# Patient Record
Sex: Male | Born: 1990
Health system: Southern US, Community
[De-identification: ages and names within clinical notes are randomized; demographics above are authoritative.]

## PROBLEM LIST (undated history)

## (undated) DIAGNOSIS — F10230 Alcohol dependence with withdrawal, uncomplicated: Secondary | ICD-10-CM

## (undated) DIAGNOSIS — J45909 Unspecified asthma, uncomplicated: Secondary | ICD-10-CM

## (undated) DIAGNOSIS — F909 Attention-deficit hyperactivity disorder, unspecified type: Secondary | ICD-10-CM

## (undated) DIAGNOSIS — K602 Anal fissure, unspecified: Secondary | ICD-10-CM

## (undated) DIAGNOSIS — I82409 Acute embolism and thrombosis of unspecified deep veins of unspecified lower extremity: Secondary | ICD-10-CM

## (undated) DIAGNOSIS — D649 Anemia, unspecified: Secondary | ICD-10-CM

## (undated) DIAGNOSIS — F1010 Alcohol abuse, uncomplicated: Secondary | ICD-10-CM

## (undated) DIAGNOSIS — F10930 Alcohol use, unspecified with withdrawal, uncomplicated: Secondary | ICD-10-CM

## (undated) DIAGNOSIS — F419 Anxiety disorder, unspecified: Secondary | ICD-10-CM

## (undated) DIAGNOSIS — I2699 Other pulmonary embolism without acute cor pulmonale: Secondary | ICD-10-CM

## (undated) DIAGNOSIS — F32A Depression, unspecified: Secondary | ICD-10-CM

## (undated) DIAGNOSIS — F329 Major depressive disorder, single episode, unspecified: Secondary | ICD-10-CM

## (undated) DIAGNOSIS — E538 Deficiency of other specified B group vitamins: Secondary | ICD-10-CM

## (undated) HISTORY — DX: Alcohol dependence with withdrawal, uncomplicated: F10.230

## (undated) HISTORY — DX: Anal fissure, unspecified: K60.2

## (undated) HISTORY — DX: Alcohol use, unspecified with withdrawal, uncomplicated: F10.930

## (undated) HISTORY — DX: Other pulmonary embolism without acute cor pulmonale: I26.99

## (undated) HISTORY — DX: Unspecified asthma, uncomplicated: J45.909

## (undated) HISTORY — PX: HERNIA REPAIR: SHX51

## (undated) HISTORY — DX: Acute embolism and thrombosis of unspecified deep veins of unspecified lower extremity: I82.409

## (undated) HISTORY — DX: Deficiency of other specified B group vitamins: E53.8

## (undated) HISTORY — DX: Anemia, unspecified: D64.9

---
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
## 2013-04-20 ENCOUNTER — Ambulatory Visit (HOSPITAL_COMMUNITY)
Admission: RE | Admit: 2013-04-20 | Discharge: 2013-04-20 | Disposition: A | Discharge status: Home / Self Care | Payer: Self-pay | Source: Ambulatory Visit | Attending: Emergency Medicine | Admitting: Emergency Medicine

## 2013-04-20 ENCOUNTER — Emergency Department (HOSPITAL_COMMUNITY)
Admission: EM | Admit: 2013-04-20 | Discharge: 2013-04-20 | Disposition: A | Discharge status: Home / Self Care | Payer: Self-pay | Attending: Emergency Medicine | Admitting: Emergency Medicine

## 2013-04-20 ENCOUNTER — Encounter (HOSPITAL_COMMUNITY): Payer: Self-pay | Admitting: Emergency Medicine

## 2013-04-20 DIAGNOSIS — M79609 Pain in unspecified limb: Secondary | ICD-10-CM | POA: Insufficient documentation

## 2013-04-20 DIAGNOSIS — Y9389 Activity, other specified: Secondary | ICD-10-CM | POA: Insufficient documentation

## 2013-04-20 DIAGNOSIS — Y33XXXA Other specified events, undetermined intent, initial encounter: Secondary | ICD-10-CM | POA: Insufficient documentation

## 2013-04-20 DIAGNOSIS — Z8659 Personal history of other mental and behavioral disorders: Secondary | ICD-10-CM | POA: Insufficient documentation

## 2013-04-20 DIAGNOSIS — S9030XA Contusion of unspecified foot, initial encounter: Secondary | ICD-10-CM | POA: Insufficient documentation

## 2013-04-20 DIAGNOSIS — F17200 Nicotine dependence, unspecified, uncomplicated: Secondary | ICD-10-CM | POA: Insufficient documentation

## 2013-04-20 DIAGNOSIS — IMO0002 Reserved for concepts with insufficient information to code with codable children: Secondary | ICD-10-CM | POA: Insufficient documentation

## 2013-04-20 DIAGNOSIS — Y929 Unspecified place or not applicable: Secondary | ICD-10-CM | POA: Insufficient documentation

## 2013-04-20 HISTORY — DX: Attention-deficit hyperactivity disorder, unspecified type: F90.9

## 2013-04-20 MED ORDER — OXYCODONE-ACETAMINOPHEN 5-325 MG PO TABS: 1.0000 | ORAL_TABLET | Freq: Once | ORAL | Status: DC

## 2013-04-20 MED ORDER — IBUPROFEN 600 MG PO TABS: 600.0000 mg | ORAL_TABLET | Freq: Four times a day (QID) | ORAL | Status: DC | PRN

## 2013-04-20 MED ORDER — TRAMADOL HCL 50 MG PO TABS: 50.0000 mg | ORAL_TABLET | Freq: Four times a day (QID) | ORAL | Status: DC | PRN

## 2013-04-20 NOTE — ED Provider Notes (Signed)
CSN: [id number]     Arrival date & time 04/20/13  0327 History   First MD Initiated Contact with Patient 04/20/13 0400     Chief Complaint  Patient presents with  . Foot Injury     (Consider location/radiation/quality/duration/timing/severity/associated sxs/prior Treatment) HPI This patient is a generally healthy young man who presents with right foot pain. His pain began after he kicked a sparring pad. Pain localizes to the proximal second and third phalanges, dorsal aspect. Worse with ambulation and weightbearing. Pain is to her 10 at rest, 8/10 with weightbearing. Pain is aching and nonradiating. Patient denies paresthesias, weakness and pain in the region.  Past Medical History  Diagnosis Date  . ADHD (attention deficit hyperactivity disorder)    History reviewed. No pertinent past surgical history. History reviewed. No pertinent family history. History  Substance Use Topics  . Smoking status: Current Every Day Smoker -- 1.00 packs/day  . Smokeless tobacco: Not on file  . Alcohol Use: Yes     Comment: once monthly    Review of Systems Ten point review of symptoms performed and is negative with the exception of symptoms noted above.     Allergies  Review of patient's allergies indicates no known allergies.  Home Medications   Current Outpatient Rx  Name  Route  Sig  Dispense  Refill  . ibuprofen (ADVIL,MOTRIN) 200 MG tablet   Oral   Take 600 mg by mouth every 6 (six) hours as needed for moderate pain.          BP 141/78  Temp(Src) 98.7 F (37.1 C) (Oral)  Resp 18  SpO2 100% Physical Exam Gen: well developed and well nourished appearing Head: NCAT Eyes: PERL, EOMI Nose: no epistaixis or rhinorrhea Mouth/throat: mucosa is moist and pink Lungs: CTA B, no wheezing, rhonchi or rales CV: RRR, no murmur, extremities appear well perfused.  Back: no ttp Skin: warm and dry Ext: normal to inspection ttp over the dorsal aspect of the proximal 2nd and 3rd phalanges.  Remainder of the RLE is normal to inspection and nontender. NVI>  Neuro: CN ii-xii grossly intact, no focal deficits, speech normal Psyche; normal affect,  calm and cooperative.   ED Course  Procedures (including critical care time) DG Foot Complete Right (Final result)  Result time: 04/20/13 04:38:53    Final result by Rad Results In Interface (04/20/13 04:38:53)    Narrative:   CLINICAL DATA: Right foot pain after kicking injury.  EXAM: RIGHT FOOT COMPLETE - 3+ VIEW  COMPARISON: None available for comparison at time of study interpretation.  FINDINGS: There is no evidence of fracture or dislocation. There is no evidence of arthropathy or other focal bone abnormality. Soft tissues are unremarkable.  IMPRESSION: Negative.   Electronically Signed By: Elon Alas On: 04/20/2013 04:38     MDM   Contusion right foot. We have ruled out fx with xray. Patient is able to bear weight. He is stable for d/c with plan for symptomatic management.    Elyn Peers, MD 04/20/13 [phone]

## 2013-04-20 NOTE — ED Notes (Signed)
Kicked Nurse, mental health and kicked a friend's hand.  Pain to rt foot steadily increasing since.

## 2013-04-20 NOTE — ED Notes (Signed)
Patient transported to X-ray 

## 2013-04-20 NOTE — Discharge Instructions (Signed)
Foot Contusion °A foot contusion is a deep bruise to the foot. Contusions are the result of an injury that caused bleeding under the skin. The contusion may turn blue, purple, or yellow. Minor injuries will give you a painless contusion, but more severe contusions may stay painful and swollen for a few weeks. °CAUSES  °A foot contusion comes from a direct blow to that area, such as a heavy object falling on the foot. °SYMPTOMS  °· Swelling of the foot. °· Discoloration of the foot. °· Tenderness or soreness of the foot. °DIAGNOSIS  °You will have a physical exam and will be asked about your history. You may need an X-ray of your foot to look for a broken bone (fracture).  °TREATMENT  °An elastic wrap may be recommended to support your foot. Resting, elevating, and applying cold compresses to your foot are often the best treatments for a foot contusion. Over-the-counter medicines may also be recommended for pain control. °HOME CARE INSTRUCTIONS  °· Put ice on the injured area. °· Put ice in a plastic bag. °· Place a towel between your skin and the bag. °· Leave the ice on for 15-20 minutes, 03-04 times a day. °· Only take over-the-counter or prescription medicines for pain, discomfort, or fever as directed by your caregiver. °· If told, use an elastic wrap as directed. This can help reduce swelling. You may remove the wrap for sleeping, showering, and bathing. If your toes become numb, cold, or blue, take the wrap off and reapply it more loosely. °· Elevate your foot with pillows to reduce swelling. °· Try to avoid standing or walking while the foot is painful. Do not resume use until instructed by your caregiver. Then, begin use gradually. If pain develops, decrease use. Gradually increase activities that do not cause discomfort until you have normal use of your foot. °· See your caregiver as directed. It is very important to keep all follow-up appointments in order to avoid any lasting problems with your foot,  including long-term (chronic) pain. °SEEK IMMEDIATE MEDICAL CARE IF:  °· You have increased redness, swelling, or pain in your foot. °· Your swelling or pain is not relieved with medicines. °· You have loss of feeling in your foot or are unable to move your toes. °· Your foot turns cold or blue. °· You have pain when you move your toes. °· Your foot becomes warm to the touch. °· Your contusion does not improve in 2 days. °MAKE SURE YOU:  °· Understand these instructions. °· Will watch your condition. °· Will get help right away if you are not doing well or get worse. °Document Released: 12/07/2005 Document Revised: 08/17/2011 Document Reviewed: 01/19/2011 °ExitCare® Patient Information ©2014 [employer]. ° °

## 2015-09-23 ENCOUNTER — Ambulatory Visit (INDEPENDENT_AMBULATORY_CARE_PROVIDER_SITE_OTHER): Payer: 59

## 2015-09-23 ENCOUNTER — Ambulatory Visit (INDEPENDENT_AMBULATORY_CARE_PROVIDER_SITE_OTHER): Payer: 59 | Admitting: Physician Assistant

## 2015-09-23 VITALS — BP 132/84 | HR 80 | Temp 98.0°F | Resp 17 | Ht 72.0 in | Wt 118.0 lb

## 2015-09-23 DIAGNOSIS — F419 Anxiety disorder, unspecified: Secondary | ICD-10-CM | POA: Diagnosis not present

## 2015-09-23 DIAGNOSIS — R1012 Left upper quadrant pain: Secondary | ICD-10-CM

## 2015-09-23 DIAGNOSIS — R768 Other specified abnormal immunological findings in serum: Secondary | ICD-10-CM

## 2015-09-23 DIAGNOSIS — Z113 Encounter for screening for infections with a predominantly sexual mode of transmission: Secondary | ICD-10-CM | POA: Diagnosis not present

## 2015-09-23 LAB — POCT URINALYSIS DIP (MANUAL ENTRY)
GLUCOSE UA: NEGATIVE
LEUKOCYTES UA: NEGATIVE
NITRITE UA: NEGATIVE
Protein Ur, POC: 30 — AB
Spec Grav, UA: 1.02
UROBILINOGEN UA: 2
pH, UA: 5.5

## 2015-09-23 LAB — POC MICROSCOPIC URINALYSIS (UMFC): Mucus: ABSENT

## 2015-09-23 MED ORDER — CITALOPRAM HYDROBROMIDE 20 MG PO TABS: 20.0000 mg | ORAL_TABLET | Freq: Every day | ORAL | 3 refills | Status: DC

## 2015-09-23 NOTE — Patient Instructions (Addendum)
Take celexa 1/2 tab for 1 week then increase to full tab thereafter. Cut way back on drinking, no more than 2 shots a day. Drink plenty of fluids, at least 64 oz water a day. Add metamucil supplement daily. Good idea to keep a diary and cut certain foods out and see if helps -- try cutting out dairy and gluten, one at a time. Exercise regularly, at least 30 minutes a day for at least 3-4 days a week. Consider seeing a therapist If you continue to lose weight, return. We may need to do some imaging to make sure nothing else is going on Return august 9th to see me.  Diet for Irritable Bowel Syndrome When you have irritable bowel syndrome (IBS), the foods you eat and your eating habits are very important. IBS may cause various symptoms, such as abdominal pain, constipation, or diarrhea. Choosing the right foods can help ease discomfort caused by these symptoms. Work with your health care provider and dietitian to find the best eating plan to help control your symptoms. WHAT GENERAL GUIDELINES DO I NEED TO FOLLOW?  Keep a food diary. This will help you identify foods that cause symptoms. Write down:  What you eat and when.  What symptoms you have.  When symptoms occur in relation to your meals.  Avoid foods that cause symptoms. Talk with your dietitian about other ways to get the same nutrients that are in these foods.  Eat more foods that contain fiber. Take a fiber supplement if directed by your dietitian.  Eat your meals slowly, in a relaxed setting.  Aim to eat 5-6 small meals per day. Do not skip meals.  Drink enough fluids to keep your urine clear or pale yellow.  Ask your health care provider if you should take an over-the-counter probiotic during flare-ups to help restore healthy gut bacteria.  If you have cramping or diarrhea, try making your meals low in fat and high in carbohydrates. Examples of carbohydrates are pasta, rice, whole grain breads and cereals, fruits, and  vegetables.  If dairy products cause your symptoms to flare up, try eating less of them. You might be able to handle yogurt better than other dairy products because it contains bacteria that help with digestion. WHAT FOODS ARE NOT RECOMMENDED? The following are some foods and drinks that may worsen your symptoms:  Fatty foods, such as [place] fries.  Milk products, such as cheese or ice cream.  Chocolate.  Alcohol.  Products with caffeine, such as coffee.  Carbonated drinks, such as soda. The items listed above may not be a complete list of foods and beverages to avoid. Contact your dietitian for more information. WHAT FOODS ARE GOOD SOURCES OF FIBER? Your health care provider or dietitian may recommend that you eat more foods that contain fiber. Fiber can help reduce constipation and other IBS symptoms. Add foods with fiber to your diet a little at a time so that your body can get used to them. Too much fiber at once might cause gas and swelling of your abdomen. The following are some foods that are good sources of fiber:  Apples.  Peaches.  Pears.  Berries.  Figs.  Broccoli (raw).  Cabbage.  Carrots.  Raw peas.  Kidney beans.  Lima beans.  Whole grain bread.  Whole grain cereal. FOR MORE INFORMATION  International Foundation for Functional Gastrointestinal Disorders: www.iffgd.[employer] of Diabetes and Digestive and Kidney Diseases: NetworkAffair.co.za.aspx   This information is not intended to replace advice  given to you by your health care provider. Make sure you discuss any questions you have with your health care provider.   Document Released: 05/08/2003 Document Revised: 03/08/2014 Document Reviewed: 05/18/2013 Elsevier Interactive Patient Education 2016 [employer].     IF you received an x-ray today, you will receive an invoice from [facility]. Please contact  [facility] at [phone] with questions or concerns regarding your invoice.   IF you received labwork today, you will receive an invoice from [employer]. Please contact [facility] at [phone] with questions or concerns regarding your invoice.   Our billing staff will not be able to assist you with questions regarding bills from these companies.  You will be contacted with the lab results as soon as they are available. The fastest way to get your results is to activate your My Chart account. Instructions are located on the last page of this paperwork. If you have not heard from [place] regarding the results in 2 weeks, please contact this office.     \

## 2015-09-23 NOTE — Progress Notes (Signed)
[facility] [address], [place] [address] [phone]  Date:  09/23/2015   Name:  Mark Mcdaniel   DOB:  27-Jul-1990   MRN:  [id number]  PCP:  Pcp Not In System    Chief Complaint: Abdominal Pain (x1year); Emesis; and Diarrhea   History of Present Illness:  This is a 25 y.o. male who is presenting with abdominal pain x 1 year. He is here today with his grandmother who he lives with. Pain is getting worse the past month or so. Wakes every single morning with left sided abdominal pain. Feels like "someone is hitting me repeatedly". Usually there for only a couple hours. Sometimes stays for the whole day. Vomits most mornings. Has diarrhea sometimes, sometimes normal. Never feels like he is empty, always feels like he could go. Stools are not normally hard and he does not strain on the toilet. No blood in stool or vomit. No mucus or fat in stool. Only abdominal surgery was hernia repair when he was 74 months old. Occ gets burning with urination. No urinary frequency or hematuria. No penile discharge. Not sexually active currently, last sexually active 3 weeks ago. Has been sexually monogamous with girlfriend of the past 4 years. Does not use condoms. Last had STD testing 6 years ago. Two fifths of brandy a week, more recently because he is stressed.  1/2 ppd cigarettes. No recreational drugs currently. No digestive issues in family. No autoimmune disorders in family. Pt eats mostly fast food. Does not eat a lot of vegetables. States he does not eat a lot of dairy. Is having a lot of anxiety currently. He does not want to go into this. His grandmother brings up a strained relationship with his girlfriend, who is also the mother of his 38 year old daughter. Pt gets teary over this and asks that she stop talking about it. Pt has been on zoloft before for his mood/anxiety -- he was quickly increased from 50 mg to 100 mg when first started and he didn't like the way it made  him feel so he stopped. Only took for <2 weeks. He does feel overall that he has problems with anxiety, irritability and anger. When he does feel stressed with life, he admits he overall feels happy though.  Review of Systems:  Review of Systems See HPI  There are no active problems to display for this patient.   Prior to Admission medications   Not on File    No Known Allergies  Past Surgical History:  Procedure Laterality Date  . HERNIA REPAIR     Performed when 33 months old    Social History  Substance Use Topics  . Smoking status: Current Every Day Smoker    Packs/day: 1.00  . Smokeless tobacco: Not on file  . Alcohol use Yes     Comment: once monthly    Family History  Problem Relation Age of Onset  . Cancer Maternal Grandmother   . Diabetes Maternal Grandmother   . Cancer Paternal Grandmother     Medication list has been reviewed and updated.  Physical Examination:  Physical Exam  Constitutional: He is oriented to person, place, and time. He appears well-developed and well-nourished. No distress.  HENT:  Head: Normocephalic and atraumatic.  Right Ear: Hearing normal.  Left Ear: Hearing normal.  Nose: Nose normal.  Mouth/Throat: Uvula is midline, oropharynx is clear and moist and mucous membranes are normal.  Eyes: Conjunctivae and lids are normal.  Right eye exhibits no discharge. Left eye exhibits no discharge. No scleral icterus.  Neck: No thyromegaly present.  Cardiovascular: Normal rate, regular rhythm, normal heart sounds and normal pulses.   No murmur heard. Pulmonary/Chest: Effort normal and breath sounds normal. No respiratory distress. He has no wheezes. He has no rhonchi. He has no rales.  Abdominal: Soft. Normal appearance. There is no tenderness. There is no CVA tenderness.  Musculoskeletal: Normal range of motion.  Lymphadenopathy:       Head (right side): No submental, no submandibular and no tonsillar adenopathy present.       Head (left  side): No submental, no submandibular and no tonsillar adenopathy present.    He has no cervical adenopathy.       Right: No supraclavicular adenopathy present.       Left: No supraclavicular adenopathy present.  Neurological: He is alert and oriented to person, place, and time.  Skin: Skin is warm, dry and intact. No lesion and no rash noted.  Psychiatric: His speech is normal and behavior is normal. Thought content normal.  Mood a little anxious and at times tearful    BP 132/84 (BP Location: Right Arm, Patient Position: Sitting, Cuff Size: Normal)   Pulse 80   Temp 98 F (36.7 C) (Oral)   Resp 17   Ht 6' (1.829 m)   Wt 118 lb (53.5 kg)   SpO2 97%   BMI 16.00 kg/m   Dg Abd 1 View  Result Date: 09/23/2015 CLINICAL DATA:  Abdominal pain 1 year.  Vomiting and diarrhea. EXAM: ABDOMEN - 1 VIEW COMPARISON:  None. FINDINGS: Bowel gas pattern is nonobstructive. No free peritoneal air, dilated small bowel loops or air-fluid levels. No mass or mass effect. Remaining bones and soft tissues are within normal. IMPRESSION: Nonobstructive bowel gas pattern. Electronically Signed   By: Marin Olp M.D.   On: 09/23/2015 17:23  Results for orders placed or performed in visit on 09/23/15  GC/Chlamydia Probe Amp  Result Value Ref Range   CT Probe RNA NOT DETECTED    GC Probe RNA NOT DETECTED   CBC  Result Value Ref Range   WBC 10.5 3.8 - 10.8 K/uL   RBC 5.24 4.20 - 5.80 MIL/uL   Hemoglobin 16.8 13.2 - 17.1 g/dL   HCT 48.3 38.5 - 50.0 %   MCV 92.2 80.0 - 100.0 fL   MCH 32.1 27.0 - 33.0 pg   MCHC 34.8 32.0 - 36.0 g/dL   RDW 14.4 11.0 - 15.0 %   Platelets 216 140 - 400 K/uL   MPV 10.5 7.5 - 12.5 fL  Comprehensive metabolic panel  Result Value Ref Range   Sodium 137 135 - 146 mmol/L   Potassium 3.3 (L) 3.5 - 5.3 mmol/L   Chloride 99 98 - 110 mmol/L   CO2 24 20 - 31 mmol/L   Glucose, Bld 71 65 - 99 mg/dL   BUN 10 7 - 25 mg/dL   Creat 0.85 0.60 - 1.35 mg/dL   Total Bilirubin 1.2 0.2 -  1.2 mg/dL   Alkaline Phosphatase 73 40 - 115 U/L   AST 15 10 - 40 U/L   ALT 7 (L) 9 - 46 U/L   Total Protein 7.9 6.1 - 8.1 g/dL   Albumin 5.1 3.6 - 5.1 g/dL   Calcium 9.5 8.6 - 10.3 mg/dL  Lipase  Result Value Ref Range   Lipase 14 7 - 60 U/L  HIV antibody  Result Value Ref Range  HIV 1&2 Ab, 4th Generation NONREACTIVE NONREACTIVE  RPR  Result Value Ref Range   RPR Ser Ql REACTIVE (A) NON REAC  Fluorescent treponemal ab(fta)-IgG-bld  Result Value Ref Range   Fluorescent Treponemal ABS NONREACTIVE   Rpr titer  Result Value Ref Range   RPR Titer 1:1   POCT Microscopic Urinalysis ([facility])  Result Value Ref Range   WBC,UR,HPF,POC Moderate (A) None WBC/hpf   RBC,UR,HPF,POC None None RBC/hpf   Bacteria None None, Too numerous to count   Mucus Absent Absent   Epithelial Cells, UR Per Microscopy Many (A) None, Too numerous to count cells/hpf  POCT urinalysis dipstick  Result Value Ref Range   Color, UA yellow yellow   Clarity, UA clear clear   Glucose, UA negative negative   Bilirubin, UA small (A) negative   Ketones, POC UA trace (5) (A) negative   Spec Grav, UA 1.020    Blood, UA trace-intact (A) negative   pH, UA 5.5    Protein Ur, POC =30 (A) negative   Urobilinogen, UA 2.0    Nitrite, UA Negative Negative   Leukocytes, UA Negative Negative    Assessment and Plan:  1. Left upper quadrant pain 2. Anxiety Suspect IBS as cause. Labs below all wnl. We discussed imaging to rule out more serious causes. Pt opted to try conservative measures first. He will work to change his diet -- eat more prepared meals at home. Cut down on sodas. Eat more fiber. Cut down on alcohol. He will try a food diary -- eventually try cutting out gluten and then dairy to see if any change. Sounds like he would benefit from SSRI -- he will start on celexa. Return in 3 weeks for f/u.  - CBC - Comprehensive metabolic panel - POCT Microscopic Urinalysis ([facility]) - POCT urinalysis dipstick - Lipase - DG  Abd 1 View; Future - citalopram (CELEXA) 20 MG tablet; Take 1 tablet (20 mg total) by mouth daily.  Dispense: 30 tablet; Refill: 3  3. Screen for STD (sexually transmitted disease) - GC/Chlamydia Probe Amp - HIV antibody - RPR  4. Biological false positive RPR test On 10/02/15 I spoke with ID doctor on call -- he felt reactive RPR with negative treponemal test was indicative of false positive result. He advised retesting in 6 weeks. I called and let pt know. We discussed the importance of safe sex until retested again in 6 weeks.  When talked to pt on 8/3, he reported his symptoms were improving. Still having pain occ but not as bad. He has not vomited at all. He is changing his diet and not eating any fast food. He is doing well on the celexa so far.   Benjaman Pott Drenda Freeze, MHS [facility]  10/07/2015

## 2015-09-24 LAB — COMPREHENSIVE METABOLIC PANEL
ALBUMIN: 5.1 g/dL (ref 3.6–5.1)
ALT: 7 U/L — ABNORMAL LOW (ref 9–46)
AST: 15 U/L (ref 10–40)
Alkaline Phosphatase: 73 U/L (ref 40–115)
BILIRUBIN TOTAL: 1.2 mg/dL (ref 0.2–1.2)
BUN: 10 mg/dL (ref 7–25)
CALCIUM: 9.5 mg/dL (ref 8.6–10.3)
CHLORIDE: 99 mmol/L (ref 98–110)
CO2: 24 mmol/L (ref 20–31)
CREATININE: 0.85 mg/dL (ref 0.60–1.35)
Glucose, Bld: 71 mg/dL (ref 65–99)
Potassium: 3.3 mmol/L — ABNORMAL LOW (ref 3.5–5.3)
SODIUM: 137 mmol/L (ref 135–146)
TOTAL PROTEIN: 7.9 g/dL (ref 6.1–8.1)

## 2015-09-24 LAB — GC/CHLAMYDIA PROBE AMP
CT PROBE, AMP APTIMA: NOT DETECTED
GC PROBE AMP APTIMA: NOT DETECTED

## 2015-09-24 LAB — CBC
HEMATOCRIT: 48.3 % (ref 38.5–50.0)
HEMOGLOBIN: 16.8 g/dL (ref 13.2–17.1)
MCH: 32.1 pg (ref 27.0–33.0)
MCHC: 34.8 g/dL (ref 32.0–36.0)
MCV: 92.2 fL (ref 80.0–100.0)
MPV: 10.5 fL (ref 7.5–12.5)
Platelets: 216 10*3/uL (ref 140–400)
RBC: 5.24 MIL/uL (ref 4.20–5.80)
RDW: 14.4 % (ref 11.0–15.0)
WBC: 10.5 10*3/uL (ref 3.8–10.8)

## 2015-09-24 LAB — HIV ANTIBODY (ROUTINE TESTING W REFLEX): HIV 1&2 Ab, 4th Generation: NONREACTIVE

## 2015-09-24 LAB — LIPASE: LIPASE: 14 U/L (ref 7–60)

## 2015-09-25 LAB — RPR: RPR: REACTIVE — AB

## 2015-09-25 LAB — FLUORESCENT TREPONEMAL AB(FTA)-IGG-BLD: Fluorescent Treponemal ABS: NONREACTIVE

## 2015-09-25 LAB — RPR TITER

## 2016-10-23 ENCOUNTER — Ambulatory Visit (HOSPITAL_COMMUNITY)
Admission: EM | Admit: 2016-10-23 | Discharge: 2016-10-23 | Disposition: A | Discharge status: Home / Self Care | Payer: Self-pay | Attending: Family Medicine | Admitting: Family Medicine

## 2016-10-23 ENCOUNTER — Encounter (HOSPITAL_COMMUNITY): Payer: Self-pay | Admitting: *Deleted

## 2016-10-23 DIAGNOSIS — R1115 Cyclical vomiting syndrome unrelated to migraine: Secondary | ICD-10-CM

## 2016-10-23 DIAGNOSIS — K2990 Gastroduodenitis, unspecified, without bleeding: Secondary | ICD-10-CM

## 2016-10-23 DIAGNOSIS — K2970 Gastritis, unspecified, without bleeding: Secondary | ICD-10-CM

## 2016-10-23 DIAGNOSIS — G43A0 Cyclical vomiting, not intractable: Secondary | ICD-10-CM

## 2016-10-23 HISTORY — DX: Alcohol abuse, uncomplicated: F10.10

## 2016-10-23 MED ORDER — ONDANSETRON 4 MG PO TBDP
ORAL_TABLET | ORAL | Status: AC
  Filled 2016-10-23: qty 1

## 2016-10-23 MED ORDER — ONDANSETRON 4 MG PO TBDP
4.0000 mg | ORAL_TABLET | Freq: Once | ORAL | Status: AC
  Administered 2016-10-23: 4 mg via ORAL

## 2016-10-23 MED ORDER — OMEPRAZOLE 20 MG PO CPDR: 20.0000 mg | DELAYED_RELEASE_CAPSULE | Freq: Every day | ORAL | 0 refills | Status: DC

## 2016-10-23 MED ORDER — PROMETHAZINE HCL 25 MG PO TABS: 25.0000 mg | ORAL_TABLET | Freq: Four times a day (QID) | ORAL | 0 refills | Status: DC | PRN

## 2016-10-23 NOTE — ED Triage Notes (Signed)
Pt reports typically drinking a fifth of liquor daily; had stopped drinking 2 days ago, then drank significant amt alcohol last night.  Today with tremors and vomiting; unable to keep down any fluids.

## 2016-10-23 NOTE — ED Provider Notes (Signed)
  [place]   [id number] 10/23/16 Arrival Time: 1931  ASSESSMENT & PLAN:  1. Non-intractable cyclical vomiting without nausea   2. Gastritis and gastroduodenitis     Meds ordered this encounter  Medications  . promethazine (PHENERGAN) 25 MG tablet    Sig: Take 1 tablet (25 mg total) by mouth every 6 (six) hours as needed for nausea or vomiting.    Dispense:  30 tablet    Refill:  0    Order Specific Question:   Supervising Provider    Answer:   Vanessa Kick [id number]  . omeprazole (PRILOSEC) 20 MG capsule    Sig: Take 1 capsule (20 mg total) by mouth daily.    Dispense:  30 capsule    Refill:  0    Order Specific Question:   Supervising Provider    Answer:   Vanessa Kick [[id number]]    Reviewed expectations re: course of current medical issues. Questions answered. Outlined signs and symptoms indicating need for more acute intervention. Patient verbalized understanding. After Visit Summary given.   SUBJECTIVE:  Mark Mcdaniel is a 26 y.o. male who presents with complaint of nausea and vomiting today and unable to keep liquids down.  He was drinking heavily last night and today is feeling the effects of drinking too much and vomiting.  He has done this before but usually does not have any problems with  Nausea and vomiting.  ROS: As per HPI.   OBJECTIVE:  Vitals:   10/23/16 1948  BP: (!) 161/97  Pulse: 96  Resp: 20  Temp: 97.9 F (36.6 C)  SpO2: 99%     General appearance: alert; no distress Eyes: PERRLA; EOMI; conjunctiva normal HENT: normocephalic; atraumatic; TMs normal; nasal mucosa normal; oral mucosa normal Neck: supple Lungs: clear to auscultation bilaterally Heart: regular rate and rhythm Abdomen: soft, non-tender; bowel sounds normal; no masses or organomegaly; no guarding or rebound tenderness Neurologic: normal gait; normal symmetric reflexes Psychological: alert and cooperative; normal mood and affect  Past Medical History:    Diagnosis Date  . ADHD (attention deficit hyperactivity disorder)   . Alcohol abuse      has a past medical history of ADHD (attention deficit hyperactivity disorder) and Alcohol abuse.    Labs Reviewed - No data to display  Imaging: No results found.  No Known Allergies  Family History  Problem Relation Age of Onset  . Cancer Maternal Grandmother   . Diabetes Maternal Grandmother   . Cancer Paternal Grandmother    Past Surgical History:  Procedure Laterality Date  . HERNIA REPAIR     Performed when 71 months old         Lysbeth Penner, [place] 10/23/16 2015

## 2016-10-24 ENCOUNTER — Encounter (HOSPITAL_COMMUNITY): Payer: Self-pay | Admitting: Emergency Medicine

## 2016-10-24 ENCOUNTER — Emergency Department (HOSPITAL_COMMUNITY)
Admission: EM | Admit: 2016-10-24 | Discharge: 2016-10-24 | Disposition: A | Discharge status: Home / Self Care | Payer: Self-pay | Attending: Emergency Medicine | Admitting: Emergency Medicine

## 2016-10-24 DIAGNOSIS — Z79899 Other long term (current) drug therapy: Secondary | ICD-10-CM | POA: Insufficient documentation

## 2016-10-24 DIAGNOSIS — R112 Nausea with vomiting, unspecified: Secondary | ICD-10-CM

## 2016-10-24 DIAGNOSIS — E876 Hypokalemia: Secondary | ICD-10-CM | POA: Insufficient documentation

## 2016-10-24 DIAGNOSIS — K2920 Alcoholic gastritis without bleeding: Secondary | ICD-10-CM | POA: Insufficient documentation

## 2016-10-24 DIAGNOSIS — F10930 Alcohol use, unspecified with withdrawal, uncomplicated: Secondary | ICD-10-CM

## 2016-10-24 DIAGNOSIS — R000 Tachycardia, unspecified: Secondary | ICD-10-CM | POA: Insufficient documentation

## 2016-10-24 DIAGNOSIS — F10230 Alcohol dependence with withdrawal, uncomplicated: Secondary | ICD-10-CM | POA: Insufficient documentation

## 2016-10-24 DIAGNOSIS — F17200 Nicotine dependence, unspecified, uncomplicated: Secondary | ICD-10-CM | POA: Insufficient documentation

## 2016-10-24 LAB — CBC
HEMATOCRIT: 48.2 % (ref 39.0–52.0)
HEMOGLOBIN: 16.9 g/dL (ref 13.0–17.0)
MCH: 33.5 pg (ref 26.0–34.0)
MCHC: 35.1 g/dL (ref 30.0–36.0)
MCV: 95.4 fL (ref 78.0–100.0)
Platelets: 261 10*3/uL (ref 150–400)
RBC: 5.05 MIL/uL (ref 4.22–5.81)
RDW: 13.1 % (ref 11.5–15.5)
WBC: 13.6 10*3/uL — ABNORMAL HIGH (ref 4.0–10.5)

## 2016-10-24 LAB — COMPREHENSIVE METABOLIC PANEL
ALK PHOS: 97 U/L (ref 38–126)
ALT: 44 U/L (ref 17–63)
AST: 41 U/L (ref 15–41)
Albumin: 4.6 g/dL (ref 3.5–5.0)
Anion gap: 19 — ABNORMAL HIGH (ref 5–15)
BUN: 10 mg/dL (ref 6–20)
CALCIUM: 9.8 mg/dL (ref 8.9–10.3)
CHLORIDE: 96 mmol/L — AB (ref 101–111)
CO2: 20 mmol/L — AB (ref 22–32)
CREATININE: 1 mg/dL (ref 0.61–1.24)
GFR calc non Af Amer: >60 mL/min (ref >60)
Glucose, Bld: 116 mg/dL — ABNORMAL HIGH (ref 65–99)
Potassium: 3 mmol/L — ABNORMAL LOW (ref 3.5–5.1)
SODIUM: 135 mmol/L (ref 135–145)
Total Bilirubin: 1.9 mg/dL — ABNORMAL HIGH (ref 0.3–1.2)
Total Protein: 8.3 g/dL — ABNORMAL HIGH (ref 6.5–8.1)

## 2016-10-24 LAB — RAPID URINE DRUG SCREEN, HOSP PERFORMED
AMPHETAMINES: NOT DETECTED
BENZODIAZEPINES: NOT DETECTED
Barbiturates: NOT DETECTED
Cocaine: NOT DETECTED
OPIATES: NOT DETECTED
Tetrahydrocannabinol: POSITIVE — AB

## 2016-10-24 LAB — URINALYSIS, ROUTINE W REFLEX MICROSCOPIC
BACTERIA UA: NONE SEEN
Bilirubin Urine: NEGATIVE
Glucose, UA: NEGATIVE mg/dL
KETONES UR: 20 mg/dL — AB
Leukocytes, UA: NEGATIVE
Nitrite: NEGATIVE
PROTEIN: 30 mg/dL — AB
Specific Gravity, Urine: 1.018 (ref 1.005–1.030)
pH: 5 (ref 5.0–8.0)

## 2016-10-24 LAB — LIPASE, BLOOD: LIPASE: 28 U/L (ref 11–51)

## 2016-10-24 MED ORDER — LORAZEPAM 2 MG/ML IJ SOLN
1.0000 mg | Freq: Once | INTRAMUSCULAR | Status: AC
  Administered 2016-10-24: 1 mg via INTRAVENOUS
  Filled 2016-10-24: qty 1

## 2016-10-24 MED ORDER — FAMOTIDINE 20 MG PO TABS: 20.0000 mg | ORAL_TABLET | Freq: Two times a day (BID) | ORAL | 0 refills | Status: DC

## 2016-10-24 MED ORDER — CHLORDIAZEPOXIDE HCL 25 MG PO CAPS: ORAL_CAPSULE | ORAL | 0 refills | Status: DC

## 2016-10-24 MED ORDER — VITAMIN B-1 100 MG PO TABS: 100.0000 mg | ORAL_TABLET | Freq: Every day | ORAL | 0 refills | Status: DC

## 2016-10-24 MED ORDER — SODIUM CHLORIDE 0.9 % IV BOLUS (SEPSIS)
2000.0000 mL | Freq: Once | INTRAVENOUS | Status: AC
  Administered 2016-10-24: 2000 mL via INTRAVENOUS

## 2016-10-24 MED ORDER — POTASSIUM CHLORIDE CRYS ER 20 MEQ PO TBCR
40.0000 meq | EXTENDED_RELEASE_TABLET | Freq: Once | ORAL | Status: AC
  Administered 2016-10-24: 40 meq via ORAL
  Filled 2016-10-24: qty 2

## 2016-10-24 MED ORDER — ONDANSETRON HCL 4 MG/2ML IJ SOLN
4.0000 mg | Freq: Once | INTRAMUSCULAR | Status: AC
  Administered 2016-10-24: 4 mg via INTRAVENOUS
  Filled 2016-10-24: qty 2

## 2016-10-24 MED ORDER — FOLIC ACID 1 MG PO TABS: 1.0000 mg | ORAL_TABLET | Freq: Every day | ORAL | 0 refills | Status: DC

## 2016-10-24 MED ORDER — ONDANSETRON HCL 4 MG PO TABS: 4.0000 mg | ORAL_TABLET | Freq: Three times a day (TID) | ORAL | 0 refills | Status: DC | PRN

## 2016-10-24 MED ORDER — MAGNESIUM SULFATE 2 GM/50ML IV SOLN
2.0000 g | Freq: Once | INTRAVENOUS | Status: AC
  Administered 2016-10-24: 2 g via INTRAVENOUS
  Filled 2016-10-24: qty 50

## 2016-10-24 MED ORDER — POTASSIUM CHLORIDE CRYS ER 20 MEQ PO TBCR
20.0000 meq | EXTENDED_RELEASE_TABLET | Freq: Every day | ORAL | 0 refills | Status: DC
Start: 2016-10-24 — End: 2017-09-18

## 2016-10-24 MED ORDER — FAMOTIDINE IN NACL 20-0.9 MG/50ML-% IV SOLN
20.0000 mg | Freq: Once | INTRAVENOUS | Status: AC
  Administered 2016-10-24: 20 mg via INTRAVENOUS
  Filled 2016-10-24: qty 50

## 2016-10-24 MED ORDER — SODIUM CHLORIDE 0.9 % IV BOLUS (SEPSIS)
1000.0000 mL | Freq: Once | INTRAVENOUS | Status: AC
  Administered 2016-10-24: 1000 mL via INTRAVENOUS

## 2016-10-24 NOTE — ED Provider Notes (Signed)
[facility] DEPT Provider Note   CSN: [id number] Arrival date & time: 10/24/16  0347     History   Chief Complaint Chief Complaint  Patient presents with  . Emesis    HPI   Blood pressure (!) 151/111, pulse (!) 105, temperature 98.3 F (36.8 C), resp. rate 18, height 6' (1.829 m), weight 61.2 kg (135 lb), SpO2 98 %.  Mark Mcdaniel is a 26 y.o. male complaining of Multiple episodes of nonbloody, nonbilious, non-coffee ground emesis over the course of the last day. He states he was drinking heavily the night before onset. He doesn't have any significant abdominal pain he denies fevers, chills, diarrhea. He also endorses concentrated urine.  On further discussion with this patient he states that he didn't drinks brandy about 1/5 of the bottle per day he's had that for roughly 6 months. He tried to quit last week and he fell off the wagon and drink heavily day before yesterday. He is not hallucinating, he states he's never had seizures from alcohol withdrawal. His detox is not being medically supervised.   Past Medical History:  Diagnosis Date  . ADHD (attention deficit hyperactivity disorder)   . Alcohol abuse     There are no active problems to display for this patient.   Past Surgical History:  Procedure Laterality Date  . HERNIA REPAIR     Performed when 10 months old       Home Medications    Prior to Admission medications   Medication Sig Start Date End Date Taking? Authorizing Provider  chlordiazePOXIDE (LIBRIUM) 25 MG capsule 50mg  PO TID x 1D, then 25-50mg  PO BID X 1D, then 25-50mg  PO QD X 1D 10/24/16   Francie Keeling, Elmyra Ricks, PA-C  citalopram (CELEXA) 20 MG tablet Take 1 tablet (20 mg total) by mouth daily. Patient not taking: Reported on 10/24/2016 09/23/15   Ezekiel Slocumb, PA-C  famotidine (PEPCID) 20 MG tablet Take 1 tablet (20 mg total) by mouth 2 (two) times daily. 10/24/16   Bernis Schreur, Elmyra Ricks, PA-C  folic acid (FOLVITE) 1 MG tablet Take 1 tablet (1 mg total)  by mouth daily. 10/24/16   Tyniesha Howald, Elmyra Ricks, PA-C  omeprazole (PRILOSEC) 20 MG capsule Take 1 capsule (20 mg total) by mouth daily. 10/23/16   Lysbeth Penner, FNP  ondansetron (ZOFRAN) 4 MG tablet Take 1 tablet (4 mg total) by mouth every 8 (eight) hours as needed for nausea or vomiting. 10/24/16   Gladie Gravette, Elmyra Ricks, PA-C  potassium chloride SA (K-DUR,KLOR-CON) 20 MEQ tablet Take 1 tablet (20 mEq total) by mouth daily. 10/24/16   Kendarrius Tanzi, Elmyra Ricks, PA-C  promethazine (PHENERGAN) 25 MG tablet Take 1 tablet (25 mg total) by mouth every 6 (six) hours as needed for nausea or vomiting. Patient not taking: Reported on 10/24/2016 10/23/16   Lysbeth Penner, FNP  thiamine (VITAMIN B-1) 100 MG tablet Take 1 tablet (100 mg total) by mouth daily. 10/24/16   Diahn Waidelich, Elmyra Ricks, PA-C    Family History Family History  Problem Relation Age of Onset  . Cancer Maternal Grandmother   . Diabetes Maternal Grandmother   . Cancer Paternal Grandmother     Social History Social History  Substance Use Topics  . Smoking status: Current Every Day Smoker    Packs/day: 1.00  . Smokeless tobacco: Never Used  . Alcohol use Yes     Comment: daily; fifth daily     Allergies   Patient has no known allergies.   Review of Systems Review of Systems  A complete review of systems was obtained and all systems are negative except as noted in the HPI and PMH.   Physical Exam Updated Vital Signs BP 123/81   Pulse 88   Temp 98.3 F (36.8 C)   Resp 15   Ht 6' (1.829 m)   Wt 61.2 kg (135 lb)   SpO2 95%   BMI 18.31 kg/m   Physical Exam  Constitutional: He is oriented to person, place, and time. He appears well-developed and well-nourished. No distress.  Positive tongue fasciculations, positive hand tremors.  HENT:  Head: Normocephalic and atraumatic.  Dry mucous membranes  Eyes: Pupils are equal, round, and reactive to light. Conjunctivae and EOM are normal.  Neck: Normal range of motion.  Cardiovascular:  Regular rhythm and intact distal pulses.   Mild tachycardia  Pulmonary/Chest: Effort normal and breath sounds normal. No respiratory distress. He has no wheezes. He has no rales. He exhibits no tenderness.  Abdominal: Soft. Bowel sounds are normal. He exhibits no distension and no mass. There is no tenderness. There is no rebound and no guarding. No hernia.  Musculoskeletal: Normal range of motion.  Neurological: He is alert and oriented to person, place, and time.  Skin: Capillary refill takes less than 2 seconds. He is not diaphoretic.  Psychiatric: He has a normal mood and affect.  Nursing note and vitals reviewed.    ED Treatments / Results  Labs (all labs ordered are listed, but only abnormal results are displayed) Labs Reviewed  COMPREHENSIVE METABOLIC PANEL - Abnormal; Notable for the following:       Result Value   Potassium 3.0 (*)    Chloride 96 (*)    CO2 20 (*)    Glucose, Bld 116 (*)    Total Protein 8.3 (*)    Total Bilirubin 1.9 (*)    Anion gap 19 (*)    All other components within normal limits  CBC - Abnormal; Notable for the following:    WBC 13.6 (*)    All other components within normal limits  URINALYSIS, ROUTINE W REFLEX MICROSCOPIC - Abnormal; Notable for the following:    APPearance HAZY (*)    Hgb urine dipstick SMALL (*)    Ketones, ur 20 (*)    Protein, ur 30 (*)    Squamous Epithelial / LPF 0-5 (*)    All other components within normal limits  RAPID URINE DRUG SCREEN, HOSP PERFORMED - Abnormal; Notable for the following:    Tetrahydrocannabinol POSITIVE (*)    All other components within normal limits  LIPASE, BLOOD    EKG  EKG Interpretation None       Radiology No results found.  Procedures Procedures (including critical care time)  Medications Ordered in ED Medications  sodium chloride 0.9 % bolus 1,000 mL (not administered)  sodium chloride 0.9 % bolus 2,000 mL (0 mLs Intravenous Stopped 10/24/16 1057)  ondansetron (ZOFRAN)  injection 4 mg (4 mg Intravenous Given 10/24/16 0651)  famotidine (PEPCID) IVPB 20 mg premix (0 mg Intravenous Stopped 10/24/16 0723)  LORazepam (ATIVAN) injection 1 mg (1 mg Intravenous Given 10/24/16 0718)  magnesium sulfate IVPB 2 g 50 mL (0 g Intravenous Stopped 10/24/16 0930)  potassium chloride SA (K-DUR,KLOR-CON) CR tablet 40 mEq (40 mEq Oral Given 10/24/16 0933)     Initial Impression / Assessment and Plan / ED Course  I have reviewed the triage vital signs and the nursing notes.  Pertinent labs & imaging results that were available during my care  of the patient were reviewed by me and considered in my medical decision making (see chart for details).     Vitals:   10/24/16 0915 10/24/16 0930 10/24/16 1000 10/24/16 1030  BP: 120/69 123/74 (!) 141/88 123/81  Pulse: 82 90 (!) 103 88  Resp: (!) 21 17 16 15   Temp:      SpO2: 96% 98% 98% 95%  Weight:      Height:        Medications  sodium chloride 0.9 % bolus 1,000 mL (not administered)  sodium chloride 0.9 % bolus 2,000 mL (0 mLs Intravenous Stopped 10/24/16 1057)  ondansetron (ZOFRAN) injection 4 mg (4 mg Intravenous Given 10/24/16 0651)  famotidine (PEPCID) IVPB 20 mg premix (0 mg Intravenous Stopped 10/24/16 0723)  LORazepam (ATIVAN) injection 1 mg (1 mg Intravenous Given 10/24/16 0718)  magnesium sulfate IVPB 2 g 50 mL (0 g Intravenous Stopped 10/24/16 0930)  potassium chloride SA (K-DUR,KLOR-CON) CR tablet 40 mEq (40 mEq Oral Given 10/24/16 0933)    Mark Mcdaniel is 26 y.o. male presenting with Persistent emesis over the course of the last 24 hours. He is drinking heavily the night before. Abdominal exam is benign. Patient clinically dehydrated. Turns out that this patient is a heavy drinker, drinks daily he's quit drinking last week and then started again several days ago. Patient is tremulous, tongue fasciculations. 1 mg of Ativan given, patient is aggressively hydrated. He is hypokalemic to 3.0 likely from emesis, patient  given magnesium IV and potassium is supplemented orally. Repeat abdominal exam is benign, fasciculations have improved after Ativan. He is tolerating by mouth fluids. Patient is given referral for outpatient detox, prescription for Librium etc.  Discussed case with attending physician who agrees with care plan and disposition.   Evaluation does not show pathology that would require ongoing emergent intervention or inpatient treatment. Pt is hemodynamically stable and mentating appropriately. Discussed findings and plan with patient/guardian, who agrees with care plan. All questions answered. Return precautions discussed and outpatient follow up given.   Final Clinical Impressions(s) / ED Diagnoses   Final diagnoses:  Alcohol withdrawal syndrome without complication (HCC)  Non-intractable vomiting with nausea, unspecified vomiting type  Acute alcoholic gastritis, presence of bleeding unspecified  Hypokalemia    New Prescriptions New Prescriptions   CHLORDIAZEPOXIDE (LIBRIUM) 25 MG CAPSULE    50mg  PO TID x 1D, then 25-50mg  PO BID X 1D, then 25-50mg  PO QD X 1D   FAMOTIDINE (PEPCID) 20 MG TABLET    Take 1 tablet (20 mg total) by mouth 2 (two) times daily.   FOLIC ACID (FOLVITE) 1 MG TABLET    Take 1 tablet (1 mg total) by mouth daily.   ONDANSETRON (ZOFRAN) 4 MG TABLET    Take 1 tablet (4 mg total) by mouth every 8 (eight) hours as needed for nausea or vomiting.   POTASSIUM CHLORIDE SA (K-DUR,KLOR-CON) 20 MEQ TABLET    Take 1 tablet (20 mEq total) by mouth daily.   THIAMINE (VITAMIN B-1) 100 MG TABLET    Take 1 tablet (100 mg total) by mouth daily.     Waynetta Pean 10/24/16 [place], [place], MD 10/25/16 [phone]

## 2016-10-24 NOTE — Discharge Instructions (Signed)
Please follow with your primary care doctor in the next 2 days for a check-up. They must obtain records for further management.  ° °Do not hesitate to return to the Emergency Department for any new, worsening or concerning symptoms.  ° °

## 2016-10-24 NOTE — ED Triage Notes (Signed)
Reports having n/v abdominal cramping that started yesterday morning.  Was seen at [facility] yesterday and sent home with omeprazole and phenergan.  Reports taking but unable to keep anything down since waking up this morning.

## 2016-11-16 ENCOUNTER — Emergency Department (HOSPITAL_COMMUNITY)
Admission: EM | Admit: 2016-11-16 | Discharge: 2016-11-16 | Disposition: A | Discharge status: Home / Self Care | Payer: Self-pay | Attending: Emergency Medicine | Admitting: Emergency Medicine

## 2016-11-16 ENCOUNTER — Emergency Department (HOSPITAL_COMMUNITY): Payer: Self-pay

## 2016-11-16 ENCOUNTER — Encounter (HOSPITAL_COMMUNITY): Payer: Self-pay | Admitting: *Deleted

## 2016-11-16 DIAGNOSIS — F10230 Alcohol dependence with withdrawal, uncomplicated: Secondary | ICD-10-CM | POA: Insufficient documentation

## 2016-11-16 DIAGNOSIS — R1031 Right lower quadrant pain: Secondary | ICD-10-CM | POA: Insufficient documentation

## 2016-11-16 DIAGNOSIS — R251 Tremor, unspecified: Secondary | ICD-10-CM | POA: Insufficient documentation

## 2016-11-16 DIAGNOSIS — F10930 Alcohol use, unspecified with withdrawal, uncomplicated: Secondary | ICD-10-CM

## 2016-11-16 DIAGNOSIS — Z79899 Other long term (current) drug therapy: Secondary | ICD-10-CM | POA: Insufficient documentation

## 2016-11-16 LAB — CBC WITH DIFFERENTIAL/PLATELET
BASOS ABS: 0 10*3/uL (ref 0.0–0.1)
BASOS PCT: 0 %
EOS ABS: 0 10*3/uL (ref 0.0–0.7)
Eosinophils Relative: 0 %
HEMATOCRIT: 51.1 % (ref 39.0–52.0)
HEMOGLOBIN: 17.8 g/dL — AB (ref 13.0–17.0)
Lymphocytes Relative: 8 %
Lymphs Abs: 1.5 10*3/uL (ref 0.7–4.0)
MCH: 33.3 pg (ref 26.0–34.0)
MCHC: 34.8 g/dL (ref 30.0–36.0)
MCV: 95.5 fL (ref 78.0–100.0)
MONOS PCT: 4 %
Monocytes Absolute: 0.8 10*3/uL (ref 0.1–1.0)
NEUTROS ABS: 16.4 10*3/uL — AB (ref 1.7–7.7)
NEUTROS PCT: 88 %
PLATELETS: 294 10*3/uL (ref 150–400)
RBC: 5.35 MIL/uL (ref 4.22–5.81)
RDW: 12.6 % (ref 11.5–15.5)
WBC: 18.7 10*3/uL — AB (ref 4.0–10.5)

## 2016-11-16 LAB — COMPREHENSIVE METABOLIC PANEL
ALBUMIN: 4.6 g/dL (ref 3.5–5.0)
ALK PHOS: 87 U/L (ref 38–126)
ALT: 17 U/L (ref 17–63)
ANION GAP: 10 (ref 5–15)
AST: 21 U/L (ref 15–41)
BILIRUBIN TOTAL: 0.6 mg/dL (ref 0.3–1.2)
BUN: 6 mg/dL (ref 6–20)
CALCIUM: 9.6 mg/dL (ref 8.9–10.3)
CO2: 25 mmol/L (ref 22–32)
Chloride: 103 mmol/L (ref 101–111)
Creatinine, Ser: 0.79 mg/dL (ref 0.61–1.24)
GFR calc non Af Amer: >60 mL/min (ref >60)
Glucose, Bld: 116 mg/dL — ABNORMAL HIGH (ref 65–99)
POTASSIUM: 3.8 mmol/L (ref 3.5–5.1)
Sodium: 138 mmol/L (ref 135–145)
TOTAL PROTEIN: 7.4 g/dL (ref 6.5–8.1)

## 2016-11-16 LAB — ETHANOL

## 2016-11-16 MED ORDER — VITAMIN B-1 100 MG PO TABS
100.0000 mg | ORAL_TABLET | Freq: Every day | ORAL | Status: DC
  Administered 2016-11-16: 100 mg via ORAL
  Filled 2016-11-16: qty 1

## 2016-11-16 MED ORDER — CHLORDIAZEPOXIDE HCL 25 MG PO CAPS: ORAL_CAPSULE | ORAL | 0 refills | Status: DC

## 2016-11-16 MED ORDER — THIAMINE HCL 100 MG/ML IJ SOLN: 100.0000 mg | Freq: Every day | INTRAMUSCULAR | Status: DC

## 2016-11-16 MED ORDER — CHLORDIAZEPOXIDE HCL 25 MG PO CAPS
100.0000 mg | ORAL_CAPSULE | Freq: Once | ORAL | Status: AC
  Administered 2016-11-16: 100 mg via ORAL
  Filled 2016-11-16: qty 4

## 2016-11-16 MED ORDER — IOPAMIDOL (ISOVUE-300) INJECTION 61%
INTRAVENOUS | Status: AC
  Administered 2016-11-16: 100 mL
  Filled 2016-11-16: qty 100

## 2016-11-16 MED ORDER — VITAMIN B-1 100 MG PO TABS: 100.0000 mg | ORAL_TABLET | Freq: Every day | ORAL | Status: DC

## 2016-11-16 MED ORDER — SODIUM CHLORIDE 0.9 % IV BOLUS (SEPSIS)
1000.0000 mL | Freq: Once | INTRAVENOUS | Status: AC
  Administered 2016-11-16: 1000 mL via INTRAVENOUS

## 2016-11-16 MED ORDER — LORAZEPAM 2 MG/ML IJ SOLN: 0.0000 mg | Freq: Two times a day (BID) | INTRAMUSCULAR | Status: DC

## 2016-11-16 MED ORDER — LORAZEPAM 1 MG PO TABS: 0.0000 mg | ORAL_TABLET | Freq: Two times a day (BID) | ORAL | Status: DC

## 2016-11-16 MED ORDER — LORAZEPAM 1 MG PO TABS
2.0000 mg | ORAL_TABLET | Freq: Once | ORAL | Status: AC
  Administered 2016-11-16: 2 mg via ORAL
  Filled 2016-11-16: qty 2

## 2016-11-16 MED ORDER — ONDANSETRON HCL 4 MG PO TABS: 4.0000 mg | ORAL_TABLET | Freq: Four times a day (QID) | ORAL | 0 refills | Status: DC

## 2016-11-16 MED ORDER — ONDANSETRON HCL 4 MG PO TABS
4.0000 mg | ORAL_TABLET | Freq: Once | ORAL | Status: AC
  Administered 2016-11-16: 4 mg via ORAL
  Filled 2016-11-16: qty 1

## 2016-11-16 NOTE — ED Notes (Signed)
Pt ambulated to RR with stand by assist. Changed into gown, pt unable to void at this time.

## 2016-11-16 NOTE — ED Triage Notes (Signed)
To ED for detox treatment. Pt having moderate tremors. States he has been drinking a 5th a day. No ETOH since yesterday. Vomiting.

## 2016-11-16 NOTE — ED Provider Notes (Signed)
[facility] DEPT Provider Note   CSN: [id number] Arrival date & time: 11/16/16  1439   History   Chief Complaint Chief Complaint  Patient presents with  . detox    HPI Mark Mcdaniel is a 26 y.o. male.  HPI   26 year old male presents today with complaints of alcohol withdrawal.  Patient notes a significant past medical history of alcohol abuse.  He notes he drinks approximately 1/5 of liquor per day.  Patient notes he has not had a drink since last night.  Patient notes he had nausea and vomiting this morning, continued nausea no more vomiting.  He notes tremors in the hands, minor sweats, slight anxiety, denies any tactile auditory or visual disturbances, no headache.  Patient denies any abdominal pain, denies fever, or any other complaints today.           Past Medical History:  Diagnosis Date  . ADHD (attention deficit hyperactivity disorder)   . Alcohol abuse     There are no active problems to display for this patient.   Past Surgical History:  Procedure Laterality Date  . HERNIA REPAIR     Performed when 89 months old       Home Medications    Prior to Admission medications   Medication Sig Start Date End Date Taking? Authorizing Provider  acetaminophen (TYLENOL) 325 MG tablet Take 325-650 mg by mouth every 6 (six) hours as needed (for pain or headache).   Yes [provider]  folic acid (FOLVITE) 1 MG tablet Take 1 tablet (1 mg total) by mouth daily. 10/24/16  Yes Pisciotta, Elmyra Ricks, PA-C  potassium chloride SA (K-DUR,KLOR-CON) 20 MEQ tablet Take 1 tablet (20 mEq total) by mouth daily. 10/24/16  Yes Pisciotta, Elmyra Ricks, PA-C  chlordiazePOXIDE (LIBRIUM) 25 MG capsule 50mg  PO TID x 1D, then 25-50mg  PO BID X 1D, then 25-50mg  PO QD X 1D 11/16/16   Leonila Speranza, Dellis Filbert, PA-C  citalopram (CELEXA) 20 MG tablet Take 1 tablet (20 mg total) by mouth daily. Patient not taking: Reported on 11/16/2016 09/23/15   Ezekiel Slocumb, PA-C  famotidine (PEPCID) 20 MG tablet Take  1 tablet (20 mg total) by mouth 2 (two) times daily. Patient not taking: Reported on 11/16/2016 10/24/16   Pisciotta, Elmyra Ricks, PA-C  omeprazole (PRILOSEC) 20 MG capsule Take 1 capsule (20 mg total) by mouth daily. Patient not taking: Reported on 11/16/2016 10/23/16   Lysbeth Penner, FNP  ondansetron (ZOFRAN) 4 MG tablet Take 1 tablet (4 mg total) by mouth every 6 (six) hours. 11/16/16   Plummer Matich, Dellis Filbert, PA-C  promethazine (PHENERGAN) 25 MG tablet Take 1 tablet (25 mg total) by mouth every 6 (six) hours as needed for nausea or vomiting. Patient not taking: Reported on 11/16/2016 10/23/16   Lysbeth Penner, FNP  thiamine (VITAMIN B-1) 100 MG tablet Take 1 tablet (100 mg total) by mouth daily. 10/24/16   Pisciotta, Elmyra Ricks, PA-C    Family History Family History  Problem Relation Age of Onset  . Cancer Maternal Grandmother   . Diabetes Maternal Grandmother   . Cancer Paternal Grandmother     Social History Social History  Substance Use Topics  . Smoking status: Current Every Day Smoker    Packs/day: 1.00  . Smokeless tobacco: Never Used  . Alcohol use Yes     Comment: daily; fifth daily     Allergies   Tape   Review of Systems Review of Systems  All other systems reviewed and are negative.   Physical  Exam Updated Vital Signs BP 114/90 (BP Location: Right Arm)   Pulse 88   Temp 97.6 F (36.4 C) (Oral)   Resp (!) 25   Ht 6' (1.829 m)   Wt 62.1 kg (137 lb)   SpO2 96%   BMI 18.58 kg/m   Physical Exam  Constitutional: He is oriented to person, place, and time. He appears well-developed and well-nourished.  HENT:  Head: Normocephalic and atraumatic.  Eyes: Pupils are equal, round, and reactive to light. Conjunctivae are normal. Right eye exhibits no discharge. Left eye exhibits no discharge. No scleral icterus.  Neck: Normal range of motion. No JVD present. No tracheal deviation present.  Pulmonary/Chest: Effort normal. No stridor.  Abdominal: Soft. He exhibits no  distension and no mass. There is tenderness. There is no rebound and no guarding. No hernia.  Minor periumbilical and RLQ abd TTP  Neurological: He is alert and oriented to person, place, and time. Coordination normal.  Tremors noted to bilateral upper extremities, these resolved completely with distraction and at rest  Psychiatric: He has a normal mood and affect. His behavior is normal. Judgment and thought content normal.  Nursing note and vitals reviewed.    ED Treatments / Results  Labs (all labs ordered are listed, but only abnormal results are displayed) Labs Reviewed  COMPREHENSIVE METABOLIC PANEL - Abnormal; Notable for the following:       Result Value   Glucose, Bld 116 (*)    All other components within normal limits  CBC WITH DIFFERENTIAL/PLATELET - Abnormal; Notable for the following:    WBC 18.7 (*)    Hemoglobin 17.8 (*)    Neutro Abs 16.4 (*)    All other components within normal limits  ETHANOL  RAPID URINE DRUG SCREEN, HOSP PERFORMED    EKG  EKG Interpretation None       Radiology Ct Abdomen Pelvis W Contrast  Result Date: 11/16/2016 CLINICAL DATA:  Unspecified abdominal pain and vomiting. Alcohol withdrawal. EXAM: CT ABDOMEN AND PELVIS WITH CONTRAST TECHNIQUE: Multidetector CT imaging of the abdomen and pelvis was performed using the standard protocol following bolus administration of intravenous contrast. CONTRAST:  164mL ISOVUE-300 IOPAMIDOL (ISOVUE-300) INJECTION 61% COMPARISON:  None. FINDINGS: Lower chest:  No contributory findings. Hepatobiliary: No focal liver abnormality.No evidence of biliary obstruction or stone. Pancreas: Unremarkable. Spleen: Unremarkable. Adrenals/Urinary Tract: Negative adrenals. No hydronephrosis or stone. Unremarkable bladder. Stomach/Bowel:  No obstruction. No appendicitis. Vascular/Lymphatic: No acute vascular abnormality. No mass or adenopathy. Reproductive:No pathologic findings. Other: No ascites or pneumoperitoneum.  Musculoskeletal: No acute abnormalities. Incidental L5 limbus vertebra. IMPRESSION: Negative abdominal CT. Electronically Signed   By: Monte Fantasia M.D.   On: 11/16/2016 19:07    Procedures Procedures (including critical care time)  Medications Ordered in ED Medications  LORazepam (ATIVAN) tablet 0-4 mg (not administered)  thiamine (VITAMIN B-1) tablet 100 mg (100 mg Oral Given 11/16/16 1547)  LORazepam (ATIVAN) tablet 2 mg (2 mg Oral Given 11/16/16 1545)  chlordiazePOXIDE (LIBRIUM) capsule 100 mg (100 mg Oral Given 11/16/16 1546)  ondansetron (ZOFRAN) tablet 4 mg (4 mg Oral Given 11/16/16 1608)  sodium chloride 0.9 % bolus 1,000 mL (1,000 mLs Intravenous New Bag/Given 11/16/16 1839)  iopamidol (ISOVUE-300) 61 % injection (100 mLs  Contrast Given 11/16/16 1851)     Initial Impression / Assessment and Plan / ED Course  I have reviewed the triage vital signs and the nursing notes.  Pertinent labs & imaging results that were available during my care of the patient  were reviewed by me and considered in my medical decision making (see chart for details).     Final Clinical Impressions(s) / ED Diagnoses   Final diagnoses:  Alcohol withdrawal syndrome without complication ([place])     Therapeutics: Librium, Ativan  Discharge Meds: librium   Assessment/Plan: 26 year old male presents today for reported withdrawals.  Patient reports nausea and vomiting, none present here.  He reports tremors.  Patient shaking his hands, but when distracted he has a very steady calm and with no obvious signs of tremors.  No visible sweating, no tactile auditory or visual hallucinations, no headache, no disorientation, no signs of severe withdrawal symptoms here.  Patient will be given Ativan, started on Librium discharge home on Librium taper. Pt notes TTP of RLQ and umbilical region. Pt has elevated WBC and tachycardia here. Although the tachycardia resolves while patient is sleeping given tenderness to right  lower quadrant, episodes of vomiting and right lower quadrant abdominal pain CT will be obtained to assess for acute appendicitis.    New Prescriptions New Prescriptions   ONDANSETRON (ZOFRAN) 4 MG TABLET    Take 1 tablet (4 mg total) by mouth every 6 (six) hours.     Okey Regal, PA-C 11/16/16 [place], [place], DO 11/19/16 [phone]

## 2016-11-16 NOTE — ED Notes (Signed)
PT states understanding of care given, follow up care, and medication prescribed. PT ambulated from ED to car with a steady gait. 

## 2016-11-16 NOTE — Discharge Instructions (Signed)
Please read attached information. If you experience any new or worsening signs or symptoms please return to the emergency room for evaluation. Please follow-up with your primary care provider or specialist as discussed. Please use medication prescribed only as directed and discontinue taking if you have any concerning signs or symptoms.   °

## 2017-03-04 ENCOUNTER — Other Ambulatory Visit: Payer: Self-pay

## 2017-03-04 ENCOUNTER — Emergency Department (HOSPITAL_COMMUNITY)
Admission: EM | Admit: 2017-03-04 | Discharge: 2017-03-04 | Disposition: A | Discharge status: Home / Self Care | Payer: 59 | Attending: Emergency Medicine | Admitting: Emergency Medicine

## 2017-03-04 ENCOUNTER — Encounter (HOSPITAL_COMMUNITY): Payer: Self-pay | Admitting: *Deleted

## 2017-03-04 DIAGNOSIS — R112 Nausea with vomiting, unspecified: Secondary | ICD-10-CM | POA: Diagnosis not present

## 2017-03-04 DIAGNOSIS — F17210 Nicotine dependence, cigarettes, uncomplicated: Secondary | ICD-10-CM | POA: Diagnosis not present

## 2017-03-04 DIAGNOSIS — R197 Diarrhea, unspecified: Secondary | ICD-10-CM | POA: Diagnosis not present

## 2017-03-04 DIAGNOSIS — F10930 Alcohol use, unspecified with withdrawal, uncomplicated: Secondary | ICD-10-CM

## 2017-03-04 DIAGNOSIS — Z79899 Other long term (current) drug therapy: Secondary | ICD-10-CM | POA: Insufficient documentation

## 2017-03-04 DIAGNOSIS — F10230 Alcohol dependence with withdrawal, uncomplicated: Secondary | ICD-10-CM | POA: Diagnosis present

## 2017-03-04 LAB — RAPID URINE DRUG SCREEN, HOSP PERFORMED
AMPHETAMINES: NOT DETECTED
Barbiturates: NOT DETECTED
Benzodiazepines: NOT DETECTED
Cocaine: NOT DETECTED
OPIATES: NOT DETECTED
Tetrahydrocannabinol: POSITIVE — AB

## 2017-03-04 LAB — CBC
HCT: 49.3 % (ref 39.0–52.0)
Hemoglobin: 16.9 g/dL (ref 13.0–17.0)
MCH: 32.6 pg (ref 26.0–34.0)
MCHC: 34.3 g/dL (ref 30.0–36.0)
MCV: 95 fL (ref 78.0–100.0)
PLATELETS: 245 10*3/uL (ref 150–400)
RBC: 5.19 MIL/uL (ref 4.22–5.81)
RDW: 13.9 % (ref 11.5–15.5)
WBC: 9 10*3/uL (ref 4.0–10.5)

## 2017-03-04 LAB — COMPREHENSIVE METABOLIC PANEL
ALK PHOS: 99 U/L (ref 38–126)
ALT: 14 U/L — ABNORMAL LOW (ref 17–63)
ANION GAP: 13 (ref 5–15)
AST: 18 U/L (ref 15–41)
Albumin: 4.5 g/dL (ref 3.5–5.0)
BUN: 12 mg/dL (ref 6–20)
CALCIUM: 9.5 mg/dL (ref 8.9–10.3)
CO2: 21 mmol/L — ABNORMAL LOW (ref 22–32)
Chloride: 101 mmol/L (ref 101–111)
Creatinine, Ser: 0.86 mg/dL (ref 0.61–1.24)
Glucose, Bld: 91 mg/dL (ref 65–99)
Potassium: 3.6 mmol/L (ref 3.5–5.1)
Sodium: 135 mmol/L (ref 135–145)
TOTAL PROTEIN: 7.9 g/dL (ref 6.5–8.1)
Total Bilirubin: 1.4 mg/dL — ABNORMAL HIGH (ref 0.3–1.2)

## 2017-03-04 LAB — ETHANOL

## 2017-03-04 MED ORDER — LORAZEPAM 2 MG/ML IJ SOLN: 1.0000 mg | Freq: Once | INTRAMUSCULAR | Status: DC

## 2017-03-04 MED ORDER — LORAZEPAM 2 MG/ML IJ SOLN
1.0000 mg | Freq: Once | INTRAMUSCULAR | Status: AC
  Administered 2017-03-04: 1 mg via INTRAVENOUS
  Filled 2017-03-04: qty 1

## 2017-03-04 MED ORDER — ONDANSETRON HCL 4 MG/2ML IJ SOLN
4.0000 mg | Freq: Once | INTRAMUSCULAR | Status: AC
  Administered 2017-03-04: 4 mg via INTRAVENOUS
  Filled 2017-03-04: qty 2

## 2017-03-04 MED ORDER — ONDANSETRON 4 MG PO TBDP: 4.0000 mg | ORAL_TABLET | Freq: Three times a day (TID) | ORAL | 0 refills | Status: DC | PRN

## 2017-03-04 MED ORDER — LORAZEPAM 1 MG PO TABS
1.0000 mg | ORAL_TABLET | Freq: Once | ORAL | Status: AC
  Administered 2017-03-04: 1 mg via ORAL
  Filled 2017-03-04: qty 1

## 2017-03-04 MED ORDER — SODIUM CHLORIDE 0.9 % IV BOLUS (SEPSIS)
1000.0000 mL | Freq: Once | INTRAVENOUS | Status: AC
  Administered 2017-03-04: 1000 mL via INTRAVENOUS

## 2017-03-04 MED ORDER — CHLORDIAZEPOXIDE HCL 25 MG PO CAPS: ORAL_CAPSULE | ORAL | 0 refills | Status: DC

## 2017-03-04 NOTE — Discharge Instructions (Signed)
Please read and follow all provided instructions.  Your diagnoses today include:  1. Alcohol withdrawal syndrome without complication (HCC)    Tests performed today include:  Blood counts and electrolytes  Alcohol level - 0  Vital signs. See below for your results today.   Medications prescribed:   Librium - medication to help calm alcohol withdrawal  DO NOT drive or perform any activities that require you to be awake and alert because this medicine can make you drowsy.    Zofran (ondansetron) - for nausea and vomiting  Take any prescribed medications only as directed.  Home care instructions:  Follow any educational materials contained in this packet.  Follow-up instructions: Please follow-up with your primary care provider in the next 3 days for further evaluation of your symptoms.   Also use referrals given to help you maintain sobriety.   Return instructions:   Please return to the Emergency Department if you experience worsening symptoms.   Return if you develop hallucinations or have a seizure.   Please return if you have any other emergent concerns.  Additional Information:  Your vital signs today were: BP (!) 130/91    Pulse 76    Temp 99 F (37.2 C) (Oral)    Resp 11    SpO2 98%  If your blood pressure (BP) was elevated above 135/85 this visit, please have this repeated by your doctor within one month. --------------

## 2017-03-04 NOTE — ED Notes (Signed)
Attempted to collect UA. Pt unable to provide at this time. Urinal at bedside

## 2017-03-04 NOTE — ED Notes (Signed)
PA at bedside during assessment

## 2017-03-04 NOTE — ED Triage Notes (Signed)
Pt reports last drinking alcohol on Wed and reports going through withdrawals. Having n/v and tremors. Denies SI or HI.

## 2017-03-04 NOTE — ED Notes (Signed)
Pt tolerated fluids well.  

## 2017-03-04 NOTE — ED Provider Notes (Signed)
[facility] EMERGENCY DEPARTMENT Provider Note   CSN: [id number] Arrival date & time: 03/04/17  1023     History   Chief Complaint Chief Complaint  Patient presents with  . Alcohol Problem  . Withdrawal    HPI Mark Mcdaniel is a 27 y.o. male.  Patient presents today with complaint of alcohol withdrawal.  He states that he last drank alcohol 2 days ago.  He also smokes a little bit of marijuana.  Denies other drugs of abuse.  Patient states that his drinking had increased recently to 2/5 of liquor daily.  He decided that he could no longer go on drinking and decided to stop.  He complains of shakes, nausea and vomiting, diarrhea, and overall malaise.  He denies any hallucinations or history of the same.  He denies any history of seizures in the past with alcohol withdrawal but did state that he blacked out one time.  No treatments prior to arrival.  He denies other recent illnesses or medical complaints. The onset of this condition was acute. The course is worsening. Aggravating factors: none. Alleviating factors: none.        Past Medical History:  Diagnosis Date  . ADHD (attention deficit hyperactivity disorder)   . Alcohol abuse     There are no active problems to display for this patient.   Past Surgical History:  Procedure Laterality Date  . HERNIA REPAIR     Performed when 54 months old       Home Medications    Prior to Admission medications   Medication Sig Start Date End Date Taking? Authorizing Provider  acetaminophen (TYLENOL) 325 MG tablet Take 650 mg by mouth every 6 (six) hours as needed for mild pain (for pain or headache).    Yes [provider]  bismuth subsalicylate (PEPTO BISMOL) 262 MG/15ML suspension Take 30 mLs by mouth every 6 (six) hours as needed for indigestion.   Yes [provider]  chlordiazePOXIDE (LIBRIUM) 25 MG capsule 50mg  PO TID x 1D, then 25-50mg  PO BID X 1D, then 25-50mg  PO QD X 1D Patient  taking differently: 25 mg 3 (three) times daily.  11/16/16  Yes Hedges, Dellis Filbert, PA-C  folic acid (FOLVITE) 1 MG tablet Take 1 tablet (1 mg total) by mouth daily. 10/24/16  Yes Pisciotta, Elmyra Ricks, PA-C  omeprazole (PRILOSEC) 20 MG capsule Take 1 capsule (20 mg total) by mouth daily. 10/23/16  Yes Lysbeth Penner, FNP  ondansetron (ZOFRAN) 4 MG tablet Take 1 tablet (4 mg total) by mouth every 6 (six) hours. 11/16/16  Yes Hedges, Dellis Filbert, PA-C  potassium chloride SA (K-DUR,KLOR-CON) 20 MEQ tablet Take 1 tablet (20 mEq total) by mouth daily. 10/24/16  Yes Pisciotta, Elmyra Ricks, PA-C  promethazine (PHENERGAN) 25 MG tablet Take 1 tablet (25 mg total) by mouth every 6 (six) hours as needed for nausea or vomiting. 10/23/16  Yes Lysbeth Penner, FNP  thiamine (VITAMIN B-1) 100 MG tablet Take 1 tablet (100 mg total) by mouth daily. 10/24/16  Yes Pisciotta, Elmyra Ricks, PA-C  citalopram (CELEXA) 20 MG tablet Take 1 tablet (20 mg total) by mouth daily. Patient not taking: Reported on 11/16/2016 09/23/15   Ezekiel Slocumb, PA-C  famotidine (PEPCID) 20 MG tablet Take 1 tablet (20 mg total) by mouth 2 (two) times daily. Patient not taking: Reported on 11/16/2016 10/24/16   Pisciotta, Elmyra Ricks, PA-C    Family History Family History  Problem Relation Age of Onset  . Cancer Maternal Grandmother   .  Diabetes Maternal Grandmother   . Cancer Paternal Grandmother     Social History Social History   Tobacco Use  . Smoking status: Current Every Day Smoker    Packs/day: 1.00  . Smokeless tobacco: Never Used  Substance Use Topics  . Alcohol use: Yes    Comment: daily; fifth daily  . Drug use: Yes    Types: Marijuana     Allergies   Tape and Tea   Review of Systems Review of Systems  Constitutional: Positive for fatigue. Negative for fever.  HENT: Negative for rhinorrhea and sore throat.   Eyes: Negative for redness.  Respiratory: Negative for cough and shortness of breath.   Cardiovascular: Negative for chest pain.   Gastrointestinal: Positive for diarrhea, nausea and vomiting. Negative for abdominal pain.  Genitourinary: Negative for dysuria.  Musculoskeletal: Negative for myalgias.  Skin: Negative for rash.  Neurological: Positive for tremors. Negative for seizures and headaches.  Psychiatric/Behavioral: Positive for sleep disturbance. Negative for agitation and hallucinations.     Physical Exam Updated Vital Signs BP (!) 155/90   Pulse 88   Temp 99 F (37.2 C) (Oral)   Resp 18   SpO2 96%   Physical Exam  Constitutional: He appears well-developed and well-nourished.  HENT:  Head: Normocephalic and atraumatic.  Mouth/Throat: Oropharynx is clear and moist.  Eyes: Conjunctivae are normal. Right eye exhibits no discharge. Left eye exhibits no discharge.  Neck: Normal range of motion. Neck supple.  Cardiovascular: Normal rate, regular rhythm and normal heart sounds.  Pulmonary/Chest: Effort normal and breath sounds normal.  Abdominal: Soft. There is no tenderness.  Neurological: He is alert.  Skin: Skin is warm and dry.  Psychiatric: He has a normal mood and affect.  Nursing note and vitals reviewed.    ED Treatments / Results  Labs (all labs ordered are listed, but only abnormal results are displayed) Labs Reviewed  COMPREHENSIVE METABOLIC PANEL - Abnormal; Notable for the following components:      Result Value   CO2 21 (*)    ALT 14 (*)    Total Bilirubin 1.4 (*)    All other components within normal limits  ETHANOL  CBC  RAPID URINE DRUG SCREEN, HOSP PERFORMED    EKG  EKG Interpretation None       Radiology No results found.  Procedures Procedures (including critical care time)  Medications Ordered in ED Medications  LORazepam (ATIVAN) injection 1 mg (not administered)  LORazepam (ATIVAN) injection 1 mg (1 mg Intravenous Given 03/04/17 1242)  ondansetron (ZOFRAN) injection 4 mg (4 mg Intravenous Given 03/04/17 1242)  sodium chloride 0.9 % bolus 1,000 mL (0 mLs  Intravenous Stopped 03/04/17 1522)     Initial Impression / Assessment and Plan / ED Course  I have reviewed the triage vital signs and the nursing notes.  Pertinent labs & imaging results that were available during my care of the patient were reviewed by me and considered in my medical decision making (see chart for details).     Patient seen and examined. Work-up initiated. Medications ordered.   Vital signs reviewed and are as follows: BP (!) 155/90   Pulse 88   Temp 99 F (37.2 C) (Oral)   Resp 18   SpO2 96%   Pt rechecked. Feels somewhat better -- additional dose of ativan ordered.   3:32 PM Will give 2nd dose ativan orally.   If tolerates PO's, will d/c with Librium and outpatient referrals. Pt agreeable.   4:20 PM Passed  PO trial. Will d/c with referrals.   Final Clinical Impressions(s) / ED Diagnoses   Final diagnoses:  Alcohol withdrawal syndrome without complication ([place])   Pt with uncomplicated alcohol withdrawal, improved in the emergency department with IV and oral Ativan.  We will discharged home with Librium, Zofran, outpatient referrals.  Patient voices desire to enroll in an inpatient treatment program.  Return instructions as above.  ED Discharge Orders        Ordered    chlordiazePOXIDE (LIBRIUM) 25 MG capsule     03/04/17 1534    ondansetron (ZOFRAN ODT) 4 MG disintegrating tablet  Every 8 hours PRN     03/04/17 1534       Carlisle Cater, PA-C 03/04/17 1621    Duffy Bruce, MD 03/05/17 [phone]

## 2017-09-17 ENCOUNTER — Encounter (HOSPITAL_COMMUNITY): Payer: Self-pay | Admitting: *Deleted

## 2017-09-17 ENCOUNTER — Emergency Department (HOSPITAL_COMMUNITY)
Admission: EM | Admit: 2017-09-17 | Discharge: 2017-09-18 | Disposition: A | Discharge status: Other Acute Inpatient Hospital | Payer: Self-pay | Attending: Emergency Medicine | Admitting: Emergency Medicine

## 2017-09-17 ENCOUNTER — Other Ambulatory Visit: Payer: Self-pay

## 2017-09-17 DIAGNOSIS — Y908 Blood alcohol level of 240 mg/100 ml or more: Secondary | ICD-10-CM | POA: Insufficient documentation

## 2017-09-17 DIAGNOSIS — Z79899 Other long term (current) drug therapy: Secondary | ICD-10-CM | POA: Insufficient documentation

## 2017-09-17 DIAGNOSIS — Z046 Encounter for general psychiatric examination, requested by authority: Secondary | ICD-10-CM | POA: Insufficient documentation

## 2017-09-17 DIAGNOSIS — F1010 Alcohol abuse, uncomplicated: Secondary | ICD-10-CM

## 2017-09-17 DIAGNOSIS — F1014 Alcohol abuse with alcohol-induced mood disorder: Secondary | ICD-10-CM | POA: Diagnosis present

## 2017-09-17 DIAGNOSIS — R45851 Suicidal ideations: Secondary | ICD-10-CM | POA: Insufficient documentation

## 2017-09-17 DIAGNOSIS — F10220 Alcohol dependence with intoxication, uncomplicated: Secondary | ICD-10-CM | POA: Insufficient documentation

## 2017-09-17 DIAGNOSIS — F17210 Nicotine dependence, cigarettes, uncomplicated: Secondary | ICD-10-CM | POA: Insufficient documentation

## 2017-09-17 DIAGNOSIS — F322 Major depressive disorder, single episode, severe without psychotic features: Secondary | ICD-10-CM | POA: Insufficient documentation

## 2017-09-17 LAB — RAPID URINE DRUG SCREEN, HOSP PERFORMED
Amphetamines: NOT DETECTED
BARBITURATES: NOT DETECTED
Benzodiazepines: NOT DETECTED
Cocaine: NOT DETECTED
OPIATES: NOT DETECTED
Tetrahydrocannabinol: NOT DETECTED

## 2017-09-17 LAB — CBC
HCT: 47.1 % (ref 39.0–52.0)
HEMOGLOBIN: 17 g/dL (ref 13.0–17.0)
MCH: 34.5 pg — ABNORMAL HIGH (ref 26.0–34.0)
MCHC: 36.1 g/dL — ABNORMAL HIGH (ref 30.0–36.0)
MCV: 95.5 fL (ref 78.0–100.0)
Platelets: 271 10*3/uL (ref 150–400)
RBC: 4.93 MIL/uL (ref 4.22–5.81)
RDW: 13.2 % (ref 11.5–15.5)
WBC: 10.3 10*3/uL (ref 4.0–10.5)

## 2017-09-17 LAB — COMPREHENSIVE METABOLIC PANEL
ALT: 123 U/L — AB (ref 0–44)
AST: 92 U/L — AB (ref 15–41)
Albumin: 4.7 g/dL (ref 3.5–5.0)
Alkaline Phosphatase: 67 U/L (ref 38–126)
Anion gap: 15 (ref 5–15)
BUN: 6 mg/dL (ref 6–20)
CO2: 21 mmol/L — ABNORMAL LOW (ref 22–32)
CREATININE: 0.67 mg/dL (ref 0.61–1.24)
Calcium: 9.3 mg/dL (ref 8.9–10.3)
Chloride: 107 mmol/L (ref 98–111)
GFR calc non Af Amer: >60 mL/min (ref >60)
Glucose, Bld: 99 mg/dL (ref 70–99)
Potassium: 3.9 mmol/L (ref 3.5–5.1)
Sodium: 143 mmol/L (ref 135–145)
Total Bilirubin: 0.5 mg/dL (ref 0.3–1.2)
Total Protein: 7.8 g/dL (ref 6.5–8.1)
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
## 2017-09-17 NOTE — ED Provider Notes (Signed)
[facility] DEPT Provider Note   CSN: [id number] Arrival date & time: 09/17/17  2200     History   Chief Complaint Chief Complaint  Patient presents with  . Suicidal  . Alcohol Problem    HPI Anthon Harpole is a 27 y.o. male.  HPI  The patient is a 27 year old male, presents to the hospital today with the custody of police after his grandmother called 34, stated that he has been making suicidal threats, the police stated that he was stating to them that he wanted to kill himself, he states to me that he is suicidal chronically but not all the time.  He has reportedly been cutting on his left wrist and been drinking large amounts of alcohol over the last several days.  The patient absolutely refuses to give me any other information.  He states "it is none of your business.".  Past Medical History:  Diagnosis Date  . ADHD (attention deficit hyperactivity disorder)   . Alcohol abuse     There are no active problems to display for this patient.   Past Surgical History:  Procedure Laterality Date  . HERNIA REPAIR     Performed when 35 months old        Home Medications    Prior to Admission medications   Medication Sig Start Date End Date Taking? Authorizing Provider  acetaminophen (TYLENOL) 325 MG tablet Take 650 mg by mouth every 6 (six) hours as needed for mild pain (for pain or headache).     [provider]  bismuth subsalicylate (PEPTO BISMOL) 262 MG/15ML suspension Take 30 mLs by mouth every 6 (six) hours as needed for indigestion.    [provider]  chlordiazePOXIDE (LIBRIUM) 25 MG capsule 50mg  PO TID x 1D, then 25-50mg  PO BID X 1D, then 25-50mg  PO QD X 1D 03/04/17   Carlisle Cater, PA-C  folic acid (FOLVITE) 1 MG tablet Take 1 tablet (1 mg total) by mouth daily. 10/24/16   Pisciotta, Elmyra Ricks, PA-C  omeprazole (PRILOSEC) 20 MG capsule Take 1 capsule (20 mg total) by mouth daily. 10/23/16   Lysbeth Penner, FNP    ondansetron (ZOFRAN ODT) 4 MG disintegrating tablet Take 1 tablet (4 mg total) by mouth every 8 (eight) hours as needed for nausea or vomiting. 03/04/17   Carlisle Cater, PA-C  ondansetron (ZOFRAN) 4 MG tablet Take 1 tablet (4 mg total) by mouth every 6 (six) hours. 11/16/16   Hedges, Dellis Filbert, PA-C  potassium chloride SA (K-DUR,KLOR-CON) 20 MEQ tablet Take 1 tablet (20 mEq total) by mouth daily. 10/24/16   Pisciotta, Elmyra Ricks, PA-C  promethazine (PHENERGAN) 25 MG tablet Take 1 tablet (25 mg total) by mouth every 6 (six) hours as needed for nausea or vomiting. 10/23/16   Lysbeth Penner, FNP  thiamine (VITAMIN B-1) 100 MG tablet Take 1 tablet (100 mg total) by mouth daily. 10/24/16   Pisciotta, Elmyra Ricks, PA-C    Family History Family History  Problem Relation Age of Onset  . Cancer Maternal Grandmother   . Diabetes Maternal Grandmother   . Cancer Paternal Grandmother     Social History Social History   Tobacco Use  . Smoking status: Current Every Day Smoker    Packs/day: 1.00  . Smokeless tobacco: Never Used  Substance Use Topics  . Alcohol use: Yes    Comment: daily; fifth daily  . Drug use: Yes    Types: Marijuana     Allergies   Tape and Tea  Review of Systems Review of Systems  Unable to perform ROS: Psychiatric disorder     Physical Exam Updated Vital Signs BP (!) 198/109 (BP Location: Left Arm)   Pulse 90   Temp 98.3 F (36.8 C) (Oral)   Resp (!) 24   SpO2 97%   Physical Exam  Constitutional: He appears well-developed and well-nourished. No distress.  HENT:  Head: Normocephalic and atraumatic.  Mouth/Throat: Oropharynx is clear and moist. No oropharyngeal exudate.  Eyes: Pupils are equal, round, and reactive to light. Conjunctivae and EOM are normal. Right eye exhibits no discharge. Left eye exhibits no discharge. No scleral icterus.  Neck: Normal range of motion. Neck supple. No JVD present. No thyromegaly present.  Cardiovascular: Normal rate, regular rhythm,  normal heart sounds and intact distal pulses. Exam reveals no gallop and no friction rub.  No murmur heard. Pulmonary/Chest: Effort normal and breath sounds normal. No respiratory distress. He has no wheezes. He has no rales.  Abdominal: Soft. Bowel sounds are normal. He exhibits no distension and no mass. There is no tenderness.  Musculoskeletal: Normal range of motion. He exhibits no edema or tenderness.  Lymphadenopathy:    He has no cervical adenopathy.  Neurological: He is alert. Coordination normal.  Skin: Skin is warm and dry. No rash noted. No erythema.  Superficial healing cut marks to the left forearm  Psychiatric: He has a normal mood and affect. His behavior is normal.  The patient appears sad agitated irritable and refuses to answer questions.  He does not appear to be responding to internal stimuli.  He endorses heavy amounts of alcohol  Nursing note and vitals reviewed.    ED Treatments / Results  Labs (all labs ordered are listed, but only abnormal results are displayed) Labs Reviewed  CBC - Abnormal; Notable for the following components:      Result Value   MCH 34.5 (*)    MCHC 36.1 (*)    All other components within normal limits  COMPREHENSIVE METABOLIC PANEL  ETHANOL  SALICYLATE LEVEL  ACETAMINOPHEN LEVEL  RAPID URINE DRUG SCREEN, HOSP PERFORMED    EKG None  Radiology No results found.  Procedures Procedures (including critical care time)  Medications Ordered in ED Medications - No data to display   Initial Impression / Assessment and Plan / ED Course  I have reviewed the triage vital signs and the nursing notes.  Pertinent labs & imaging results that were available during my care of the patient were reviewed by me and considered in my medical decision making (see chart for details).    The patient is difficult to assess because he will not talk to me, there is reports from police that the patient has been making suicidal threats.  At this point  it would seem reasonable to continue with an emergency commitment based on this history.  Patient refuses to give me any more information, he is definitely a risk to himself and claims this to me in discussion stating that he would kill himself.  He has been cutting on his arm, I filled out involuntary commitment papers which will be faxed to the magistrate.  Psych consulted for placement and evaluation  Change of shift - care signed out to oncoming EDP  Final Clinical Impressions(s) / ED Diagnoses   Final diagnoses:  Suicidal thoughts  Alcohol abuse      Noemi Chapel, MD 09/17/17 2344

## 2017-09-17 NOTE — ED Triage Notes (Signed)
Pt arrives ambulatory to triage room. Says he was brought here by the police, told them he wanted to kill himself. Pt has been cutting (left wrist markings noted) over the past several days. Intoxicated.

## 2017-09-17 NOTE — ED Notes (Signed)
Bed: [facility] Expected date:  Expected time:  Means of arrival:  Comments: 

## 2017-09-17 NOTE — Progress Notes (Signed)
Per Patriciaann Clan, PA pt meets criteria for inpt treatment. EDP Noemi Chapel, MD and charge nurse aware of disposition. Pt to be transferred to TCU. [facility] currently reviewing the pt for possible inpt admission.  Lind Covert, MSW, LCSW Therapeutic Triage Specialist  [phone]

## 2017-09-18 ENCOUNTER — Other Ambulatory Visit: Payer: Self-pay

## 2017-09-18 ENCOUNTER — Encounter (HOSPITAL_COMMUNITY): Payer: Self-pay | Admitting: *Deleted

## 2017-09-18 ENCOUNTER — Inpatient Hospital Stay (HOSPITAL_COMMUNITY)
Admission: AD | Admit: 2017-09-18 | Discharge: 2017-09-21 | DRG: 897 | Disposition: A | Discharge status: Home / Self Care | Payer: Federal, State, Local not specified - Other | Source: Intra-hospital | Attending: Psychiatry | Admitting: Psychiatry

## 2017-09-18 DIAGNOSIS — F909 Attention-deficit hyperactivity disorder, unspecified type: Secondary | ICD-10-CM | POA: Diagnosis present

## 2017-09-18 DIAGNOSIS — Y908 Blood alcohol level of 240 mg/100 ml or more: Secondary | ICD-10-CM | POA: Diagnosis present

## 2017-09-18 DIAGNOSIS — F17210 Nicotine dependence, cigarettes, uncomplicated: Secondary | ICD-10-CM | POA: Diagnosis present

## 2017-09-18 DIAGNOSIS — R748 Abnormal levels of other serum enzymes: Secondary | ICD-10-CM | POA: Diagnosis present

## 2017-09-18 DIAGNOSIS — F410 Panic disorder [episodic paroxysmal anxiety] without agoraphobia: Secondary | ICD-10-CM | POA: Diagnosis present

## 2017-09-18 DIAGNOSIS — Z62810 Personal history of physical and sexual abuse in childhood: Secondary | ICD-10-CM | POA: Diagnosis present

## 2017-09-18 DIAGNOSIS — R45851 Suicidal ideations: Secondary | ICD-10-CM | POA: Diagnosis present

## 2017-09-18 DIAGNOSIS — F322 Major depressive disorder, single episode, severe without psychotic features: Secondary | ICD-10-CM | POA: Diagnosis present

## 2017-09-18 DIAGNOSIS — F1014 Alcohol abuse with alcohol-induced mood disorder: Secondary | ICD-10-CM

## 2017-09-18 DIAGNOSIS — G4700 Insomnia, unspecified: Secondary | ICD-10-CM | POA: Diagnosis present

## 2017-09-18 DIAGNOSIS — F10239 Alcohol dependence with withdrawal, unspecified: Secondary | ICD-10-CM | POA: Diagnosis present

## 2017-09-18 DIAGNOSIS — F1994 Other psychoactive substance use, unspecified with psychoactive substance-induced mood disorder: Secondary | ICD-10-CM | POA: Diagnosis present

## 2017-09-18 DIAGNOSIS — F1024 Alcohol dependence with alcohol-induced mood disorder: Secondary | ICD-10-CM | POA: Diagnosis present

## 2017-09-18 HISTORY — DX: Depression, unspecified: F32.A

## 2017-09-18 HISTORY — DX: Anxiety disorder, unspecified: F41.9

## 2017-09-18 HISTORY — DX: Major depressive disorder, single episode, unspecified: F32.9

## 2017-09-18 LAB — HEMOGLOBIN A1C
Hgb A1c MFr Bld: 5.5 % (ref 4.8–5.6)
Mean Plasma Glucose: 111.15 mg/dL

## 2017-09-18 LAB — SALICYLATE LEVEL

## 2017-09-18 LAB — LIPID PANEL
CHOL/HDL RATIO: 1.4 ratio
Cholesterol: 173 mg/dL (ref 0–200)
HDL: 126 mg/dL (ref >40)
LDL CALC: 43 mg/dL (ref 0–99)
TRIGLYCERIDES: 21 mg/dL (ref <150)
VLDL: 4 mg/dL (ref 0–40)

## 2017-09-18 LAB — HEPATIC FUNCTION PANEL
ALK PHOS: 54 U/L (ref 38–126)
ALT: 117 U/L — ABNORMAL HIGH (ref 0–44)
AST: 95 U/L — ABNORMAL HIGH (ref 15–41)
Albumin: 4.4 g/dL (ref 3.5–5.0)
BILIRUBIN INDIRECT: 0.7 mg/dL (ref 0.3–0.9)
BILIRUBIN TOTAL: 0.8 mg/dL (ref 0.3–1.2)
Bilirubin, Direct: 0.1 mg/dL (ref 0.0–0.2)
Total Protein: 7.5 g/dL (ref 6.5–8.1)

## 2017-09-18 LAB — ACETAMINOPHEN LEVEL: Acetaminophen (Tylenol), Serum: <10 ug/mL — ABNORMAL LOW (ref 10–30)

## 2017-09-18 LAB — ETHANOL: Alcohol, Ethyl (B): 361 mg/dL (ref <10)

## 2017-09-18 MED ORDER — LORAZEPAM 1 MG PO TABS: 1.0000 mg | ORAL_TABLET | Freq: Four times a day (QID) | ORAL | Status: DC

## 2017-09-18 MED ORDER — LORAZEPAM 1 MG PO TABS: 1.0000 mg | ORAL_TABLET | Freq: Three times a day (TID) | ORAL | Status: DC

## 2017-09-18 MED ORDER — LOPERAMIDE HCL 2 MG PO CAPS: 2.0000 mg | ORAL_CAPSULE | ORAL | Status: AC | PRN

## 2017-09-18 MED ORDER — FLUOXETINE HCL 10 MG PO CAPS
10.0000 mg | ORAL_CAPSULE | Freq: Every day | ORAL | Status: DC
  Administered 2017-09-18: 10 mg via ORAL
  Filled 2017-09-18: qty 1

## 2017-09-18 MED ORDER — ALUM & MAG HYDROXIDE-SIMETH 200-200-20 MG/5ML PO SUSP: 30.0000 mL | ORAL | Status: DC | PRN

## 2017-09-18 MED ORDER — VITAMIN B-1 100 MG PO TABS: 100.0000 mg | ORAL_TABLET | Freq: Every day | ORAL | Status: DC

## 2017-09-18 MED ORDER — CHLORDIAZEPOXIDE HCL 25 MG PO CAPS: 25.0000 mg | ORAL_CAPSULE | Freq: Three times a day (TID) | ORAL | Status: DC

## 2017-09-18 MED ORDER — LORAZEPAM 1 MG PO TABS: 1.0000 mg | ORAL_TABLET | Freq: Four times a day (QID) | ORAL | Status: DC | PRN

## 2017-09-18 MED ORDER — THIAMINE HCL 100 MG/ML IJ SOLN
100.0000 mg | Freq: Once | INTRAMUSCULAR | Status: AC
  Administered 2017-09-18: 100 mg via INTRAMUSCULAR
  Filled 2017-09-18: qty 2

## 2017-09-18 MED ORDER — LOPERAMIDE HCL 2 MG PO CAPS: 2.0000 mg | ORAL_CAPSULE | ORAL | Status: DC | PRN

## 2017-09-18 MED ORDER — LORAZEPAM 1 MG PO TABS
1.0000 mg | ORAL_TABLET | Freq: Every day | ORAL | Status: DC
Start: 2017-09-22 — End: 2017-09-18

## 2017-09-18 MED ORDER — GABAPENTIN 100 MG PO CAPS
200.0000 mg | ORAL_CAPSULE | Freq: Two times a day (BID) | ORAL | Status: DC
  Administered 2017-09-18 – 2017-09-19 (×3): 200 mg via ORAL
  Filled 2017-09-18 (×6): qty 2
  Filled 2017-09-18: qty 28
  Filled 2017-09-18: qty 2
  Filled 2017-09-18: qty 28
  Filled 2017-09-18 (×3): qty 2

## 2017-09-18 MED ORDER — ADULT MULTIVITAMIN W/MINERALS CH
1.0000 | ORAL_TABLET | Freq: Every day | ORAL | Status: DC
  Administered 2017-09-18: 1 via ORAL
  Filled 2017-09-18: qty 1

## 2017-09-18 MED ORDER — MAGNESIUM HYDROXIDE 400 MG/5ML PO SUSP: 30.0000 mL | Freq: Every day | ORAL | Status: DC | PRN

## 2017-09-18 MED ORDER — LORAZEPAM 1 MG PO TABS: 1.0000 mg | ORAL_TABLET | Freq: Two times a day (BID) | ORAL | Status: DC

## 2017-09-18 MED ORDER — ONDANSETRON 4 MG PO TBDP
4.0000 mg | ORAL_TABLET | Freq: Four times a day (QID) | ORAL | Status: DC | PRN
  Administered 2017-09-18: 4 mg via ORAL
  Filled 2017-09-18: qty 1

## 2017-09-18 MED ORDER — HYDROXYZINE HCL 25 MG PO TABS
25.0000 mg | ORAL_TABLET | Freq: Four times a day (QID) | ORAL | Status: DC | PRN
  Administered 2017-09-18: 25 mg via ORAL
  Filled 2017-09-18: qty 1

## 2017-09-18 MED ORDER — ACETAMINOPHEN 325 MG PO TABS: 650.0000 mg | ORAL_TABLET | Freq: Four times a day (QID) | ORAL | Status: DC | PRN

## 2017-09-18 MED ORDER — HYDROXYZINE HCL 25 MG PO TABS
25.0000 mg | ORAL_TABLET | Freq: Four times a day (QID) | ORAL | Status: AC | PRN
  Administered 2017-09-18: 25 mg via ORAL
  Filled 2017-09-18: qty 1
  Filled 2017-09-18: qty 10

## 2017-09-18 MED ORDER — LORAZEPAM 1 MG PO TABS: 1.0000 mg | ORAL_TABLET | Freq: Every day | ORAL | Status: DC

## 2017-09-18 MED ORDER — VITAMIN B-1 100 MG PO TABS
100.0000 mg | ORAL_TABLET | Freq: Every day | ORAL | Status: DC
  Administered 2017-09-19 – 2017-09-20 (×2): 100 mg via ORAL
  Filled 2017-09-18 (×6): qty 1

## 2017-09-18 MED ORDER — CHLORDIAZEPOXIDE HCL 25 MG PO CAPS: 25.0000 mg | ORAL_CAPSULE | Freq: Every day | ORAL | Status: DC

## 2017-09-18 MED ORDER — GABAPENTIN 100 MG PO CAPS
200.0000 mg | ORAL_CAPSULE | Freq: Two times a day (BID) | ORAL | Status: DC
  Administered 2017-09-18: 200 mg via ORAL
  Filled 2017-09-18: qty 2

## 2017-09-18 MED ORDER — CHLORDIAZEPOXIDE HCL 25 MG PO CAPS: 25.0000 mg | ORAL_CAPSULE | ORAL | Status: DC

## 2017-09-18 MED ORDER — NICOTINE 21 MG/24HR TD PT24
21.0000 mg | MEDICATED_PATCH | Freq: Every day | TRANSDERMAL | Status: DC
  Administered 2017-09-19 – 2017-09-21 (×3): 21 mg via TRANSDERMAL
  Filled 2017-09-18 (×6): qty 1

## 2017-09-18 MED ORDER — FLUOXETINE HCL 10 MG PO CAPS
10.0000 mg | ORAL_CAPSULE | Freq: Every day | ORAL | Status: DC
  Administered 2017-09-19 – 2017-09-21 (×3): 10 mg via ORAL
  Filled 2017-09-18 (×3): qty 1
  Filled 2017-09-18: qty 7
  Filled 2017-09-18 (×2): qty 1

## 2017-09-18 MED ORDER — LORAZEPAM 1 MG PO TABS
1.0000 mg | ORAL_TABLET | Freq: Four times a day (QID) | ORAL | Status: DC
  Administered 2017-09-18: 1 mg via ORAL
  Filled 2017-09-18: qty 1

## 2017-09-18 MED ORDER — CHLORDIAZEPOXIDE HCL 25 MG PO CAPS
25.0000 mg | ORAL_CAPSULE | Freq: Four times a day (QID) | ORAL | Status: DC
  Administered 2017-09-18 – 2017-09-19 (×3): 25 mg via ORAL
  Filled 2017-09-18 (×3): qty 1

## 2017-09-18 MED ORDER — TRAZODONE HCL 50 MG PO TABS
50.0000 mg | ORAL_TABLET | Freq: Every evening | ORAL | Status: DC | PRN
  Administered 2017-09-18 – 2017-09-20 (×3): 50 mg via ORAL
  Filled 2017-09-18: qty 7
  Filled 2017-09-18 (×3): qty 1

## 2017-09-18 MED ORDER — NICOTINE 21 MG/24HR TD PT24
21.0000 mg | MEDICATED_PATCH | Freq: Every day | TRANSDERMAL | Status: DC
  Administered 2017-09-18: 21 mg via TRANSDERMAL
  Filled 2017-09-18: qty 1

## 2017-09-18 MED ORDER — ADULT MULTIVITAMIN W/MINERALS CH
1.0000 | ORAL_TABLET | Freq: Every day | ORAL | Status: DC
  Administered 2017-09-19 – 2017-09-20 (×2): 1 via ORAL
  Filled 2017-09-18 (×6): qty 1

## 2017-09-18 NOTE — ED Notes (Signed)
Patient sister Jerauld Bostwick [phone]

## 2017-09-18 NOTE — Progress Notes (Signed)
The patient attended the evening A.A.and was appropriate.

## 2017-09-18 NOTE — ED Notes (Signed)
CRITICAL VALUE STICKER  CRITICAL VALUE: Alc [phone]  RECEIVER (on-site recipient of call): Jake T   DATE & TIME NOTIFIED: 09/18/17 0003  MESSENGER (representative from lab): Blanch Media  MD NOTIFIED: Sabra Heck  TIME OF NOTIFICATION: 09/18/17   RESPONSE: see orders

## 2017-09-18 NOTE — Tx Team (Signed)
Initial Treatment Plan 09/18/2017 6:10 PM Mark Mcdaniel LLV:[id number]    PATIENT STRESSORS: Financial difficulties Health problems Legal issue Marital or family conflict Medication change or noncompliance Occupational concerns Substance abuse   PATIENT STRENGTHS: Ability for insight Active sense of humor Average or above average intelligence Capable of independent living Communication skills General fund of knowledge Motivation for treatment/growth Supportive family/friends   PATIENT IDENTIFIED PROBLEMS: "substance abuse"  "suicide thoughts"  "depression"  "anxiety"               DISCHARGE CRITERIA:  Ability to meet basic life and health needs Adequate post-discharge living arrangements Improved stabilization in mood, thinking, and/or behavior Medical problems require only outpatient monitoring Motivation to continue treatment in a less acute level of care Need for constant or close observation no longer present Reduction of life-threatening or endangering symptoms to within safe limits Safe-care adequate arrangements made Verbal commitment to aftercare and medication compliance Withdrawal symptoms are absent or subacute and managed without 24-hour nursing intervention  PRELIMINARY DISCHARGE PLAN: Attend aftercare/continuing care group Attend PHP/IOP Attend 12-step recovery group Outpatient therapy Participate in family therapy Return to previous living arrangement  PATIENT/FAMILY INVOLVEMENT: This treatment plan has been presented to and reviewed with the patient, Mark Mcdaniel.  The patient and family have been given the opportunity to ask questions and make suggestions.  Grayland Ormond Moshannon, RN 09/18/2017, 6:10 PM
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
## 2017-09-18 NOTE — Plan of Care (Signed)
Nurse discussed anxiety, depression, coping skills with patient. 
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
## 2017-09-18 NOTE — ED Notes (Signed)
Bed: HQ19 Expected date:  Expected time:  Means of arrival:  Comments: [place] after IVC

## 2017-09-18 NOTE — Consult Note (Addendum)
[facility] Consult   Reason for Consult:  Suicidal ideations  Referring Physician:  EDP Patient Identification: Mark Mcdaniel MRN:  [id number] Principal Diagnosis: Major depressive disorder, single episode, severe ([place]) Diagnosis:   Patient Active Problem List   Diagnosis Date Noted  . Alcohol abuse with alcohol-induced mood disorder ([place]) [F10.14] 09/18/2017  . Major depressive disorder, single episode, severe ([place]) [F32.2] 09/18/2017  . Suicidal thoughts [R45.851]     Total Time spent with patient: 45 minutes  Subjective:   Mark Mcdaniel is a 27 y.o. male patient admitted with intoxicated with suicidal ideation.  HPI:  Pt was seen and chart reviewed with treatment team and Dr Darleene Cleaver.  Pt denies homicidal ideation, denies auditory/visual hallucinations and does not appear to be responding to internal stimuli. Pt does not endorse suicidal ideation and sates that he only drinks occasionally but the Pt is minimizing. His BAL on admission was 361 and UDS negative. Pt stated his first drink was at age 58 and he started drinking more at age 61. Pt's liver enzymes are elevated at AST 92 and ALT 123. Pt complained of stomach upset this morning but has not been vomiting. Pt was placed n CIWA protocol today for withdrawal symptoms. Pt's other labs are unremarkable. No other diagnostic tests were ordered for this admission. Pt would benefit from an inpatient psychiatric admission for crisis stabilization and medication management.   Past Psychiatric History: As above  Risk to Self: Suicidal Ideation: Yes-Currently Present Suicidal Intent: Yes-Currently Present Is patient at risk for suicide?: Yes Suicidal Plan?: Yes-Currently Present Specify Current Suicidal Plan: pt refuses to disclose plan to this writer Access to Means: (unknown) What has been your use of drugs/alcohol within the last 12 months?: heavy alcohol use How many times?: (multiple) Other Self Harm Risks:  depression, alcoholism, unemployed, hx of suicide attempts Triggers for Past Attempts: Unknown Intentional Self Injurious Behavior: Cutting Comment - Self Injurious Behavior: pt has self-inflicted cuts on his arms  Risk to Others: Homicidal Ideation: No-Not Currently/Within Last 6 Months(passive thoughts, wants to join Development worker, international aid) Thoughts of Harm to Others: No Current Homicidal Intent: No Current Homicidal Plan: No Access to Homicidal Means: No History of harm to others?: No Assessment of Violence: None Noted Does patient have access to weapons?: Yes (Comment)(guns in the house) Criminal Charges Pending?: No Does patient have a court date: No Prior Inpatient Therapy: Prior Inpatient Therapy: No Prior Outpatient Therapy: Prior Outpatient Therapy: No Does patient have an ACCT team?: No Does patient have Intensive In-House Services?  : No Does patient have [facility] services? : No Does patient have P4CC services?: No  Past Medical History:  Past Medical History:  Diagnosis Date  . ADHD (attention deficit hyperactivity disorder)   . Alcohol abuse     Past Surgical History:  Procedure Laterality Date  . HERNIA REPAIR     Performed when 70 months old   Family History:  Family History  Problem Relation Age of Onset  . Cancer Maternal Grandmother   . Diabetes Maternal Grandmother   . Cancer Paternal Grandmother    Family Psychiatric  History: Unknown Social History:  Social History   Substance and Sexual Activity  Alcohol Use Yes   Comment: daily; fifth daily     Social History   Substance and Sexual Activity  Drug Use Yes  . Types: Marijuana    Social History   Socioeconomic History  . Marital status: Single    Spouse name: Not on file  .  Number of children: Not on file  . Years of education: Not on file  . Highest education level: Not on file  Occupational History  . Not on file  Social Needs  . Financial resource strain: Not on file  . Food insecurity:     Worry: Not on file    Inability: Not on file  . Transportation needs:    Medical: Not on file    Non-medical: Not on file  Tobacco Use  . Smoking status: Current Every Day Smoker    Packs/day: 1.00  . Smokeless tobacco: Never Used  Substance and Sexual Activity  . Alcohol use: Yes    Comment: daily; fifth daily  . Drug use: Yes    Types: Marijuana  . Sexual activity: Not on file  Lifestyle  . Physical activity:    Days per week: Not on file    Minutes per session: Not on file  . Stress: Not on file  Relationships  . Social connections:    Talks on phone: Not on file    Gets together: Not on file    Attends religious service: Not on file    Active member of club or organization: Not on file    Attends meetings of clubs or organizations: Not on file    Relationship status: Not on file  Other Topics Concern  . Not on file  Social History Narrative  . Not on file   Additional Social History:    Allergies:   Allergies  Allergen Reactions  . Tape Other (See Comments)    Medical tapes irritates the patient's skin  . Tea Nausea And Vomiting    Labs:  Results for orders placed or performed during the hospital encounter of 09/17/17 (from the past 48 hour(s))  Comprehensive metabolic panel     Status: Abnormal   Collection Time: 09/17/17 11:14 PM  Result Value Ref Range   Sodium 143 135 - 145 mmol/L   Potassium 3.9 3.5 - 5.1 mmol/L   Chloride 107 98 - 111 mmol/L    Comment: Please note change in reference range.   CO2 21 (L) 22 - 32 mmol/L   Glucose, Bld 99 70 - 99 mg/dL    Comment: Please note change in reference range.   BUN 6 6 - 20 mg/dL    Comment: Please note change in reference range.   Creatinine, Ser 0.67 0.61 - 1.24 mg/dL   Calcium 9.3 8.9 - 10.3 mg/dL   Total Protein 7.8 6.5 - 8.1 g/dL   Albumin 4.7 3.5 - 5.0 g/dL   AST 92 (H) 15 - 41 U/L   ALT 123 (H) 0 - 44 U/L    Comment: Please note change in reference range.   Alkaline Phosphatase 67 38 - 126 U/L    Total Bilirubin 0.5 0.3 - 1.2 mg/dL   GFR calc non Af Amer >60 >60 mL/min   GFR calc Af Amer >60 >60 mL/min    Comment: (NOTE) The eGFR has been calculated using the CKD EPI equation. This calculation has not been validated in all clinical situations. eGFR's persistently <60 mL/min signify possible Chronic Kidney Disease.    Anion gap 15 5 - 15    Comment: Performed at [facility], [place] [address] [place], [place] [address]  Ethanol     Status: Abnormal   Collection Time: 09/17/17 11:14 PM  Result Value Ref Range   Alcohol, Ethyl (B) 361 (HH) <10 mg/dL    Comment: CRITICAL  RESULT CALLED TO, READ BACK BY AND VERIFIED WITH: TALKINGTON,J AT 0002 ON 09/18/2017 BY MOSLEY,J (NOTE) Lowest detectable limit for serum alcohol is 10 mg/dL. For medical purposes only. Performed at [facility], [place] [address] [place], [place] [address]   Salicylate level     Status: None   Collection Time: 09/17/17 11:14 PM  Result Value Ref Range   Salicylate Lvl <4.2 2.8 - 30.0 mg/dL    Comment: Performed at [facility], [place] [address], [place], [place] [address]  Acetaminophen level     Status: Abnormal   Collection Time: 09/17/17 11:14 PM  Result Value Ref Range   Acetaminophen (Tylenol), Serum <10 (L) 10 - 30 ug/mL    Comment: (NOTE) Therapeutic concentrations vary significantly. A range of 10-30 ug/mL  may be an effective concentration for many patients. However, some  are best treated at concentrations outside of this range. Acetaminophen concentrations >150 ug/mL at 4 hours after ingestion  and >50 ug/mL at 12 hours after ingestion are often associated with  toxic reactions. Performed at [facility], [place] [address] [place], [place] [address]   cbc     Status: Abnormal   Collection Time: 09/17/17 11:14 PM  Result Value Ref Range   WBC 10.3 4.0 - 10.5 K/uL   RBC 4.93 4.22 - 5.81 MIL/uL   Hemoglobin 17.0 13.0 -  17.0 g/dL   HCT 47.1 39.0 - 52.0 %   MCV 95.5 78.0 - 100.0 fL   MCH 34.5 (H) 26.0 - 34.0 pg   MCHC 36.1 (H) 30.0 - 36.0 g/dL   RDW 13.2 11.5 - 15.5 %   Platelets 271 150 - 400 K/uL    Comment: Performed at [facility], [place] [address], [place], [place] [address]  Rapid urine drug screen (hospital performed)     Status: None   Collection Time: 09/17/17 11:14 PM  Result Value Ref Range   Opiates NONE DETECTED NONE DETECTED   Cocaine NONE DETECTED NONE DETECTED   Benzodiazepines NONE DETECTED NONE DETECTED   Amphetamines NONE DETECTED NONE DETECTED   Tetrahydrocannabinol NONE DETECTED NONE DETECTED   Barbiturates NONE DETECTED NONE DETECTED    Comment: (NOTE) DRUG SCREEN FOR MEDICAL PURPOSES ONLY.  IF CONFIRMATION IS NEEDED FOR ANY PURPOSE, NOTIFY LAB WITHIN 5 DAYS. LOWEST DETECTABLE LIMITS FOR URINE DRUG SCREEN Drug Class                     Cutoff (ng/mL) Amphetamine and metabolites    1000 Barbiturate and metabolites    200 Benzodiazepine                 168 Tricyclics and metabolites     300 Opiates and metabolites        300 Cocaine and metabolites        300 THC                            50 Performed at [facility], [place] [address], [place], [place] [address]     Current Facility-Administered Medications  Medication Dose Route Frequency Provider Last Rate Last Dose  . FLUoxetine (PROZAC) capsule 10 mg  10 mg Oral Daily Zona Pedro, MD      . gabapentin (NEURONTIN) capsule 200 mg  200 mg Oral BID Kinda Pottle, MD      . hydrOXYzine (ATARAX/VISTARIL) tablet 25 mg  25 mg  Oral Q6H PRN Joshlynn Alfonzo, MD      . loperamide (IMODIUM) capsule 2-4 mg  2-4 mg Oral PRN Rufina Kimery, MD      . LORazepam (ATIVAN) tablet 1 mg  1 mg Oral Q6H PRN Vallarie Fei, MD      . LORazepam (ATIVAN) tablet 1 mg  1 mg Oral QID Corena Pilgrim, MD       Followed by  . [START ON 09/19/2017] LORazepam (ATIVAN) tablet 1 mg  1 mg Oral TID  Corena Pilgrim, MD       Followed by  . [START ON 09/20/2017] LORazepam (ATIVAN) tablet 1 mg  1 mg Oral BID Corena Pilgrim, MD       Followed by  . [START ON 09/22/2017] LORazepam (ATIVAN) tablet 1 mg  1 mg Oral Daily Sandrine Bloodsworth, MD      . multivitamin with minerals tablet 1 tablet  1 tablet Oral Daily Graydon Fofana, MD      . nicotine (NICODERM CQ - dosed in mg/24 hours) patch 21 mg  21 mg Transdermal Daily Ethelene Hal, NP   21 mg at 09/18/17 1133  . ondansetron (ZOFRAN-ODT) disintegrating tablet 4 mg  4 mg Oral Q6H PRN Elycia Woodside, MD      . thiamine (B-1) injection 100 mg  100 mg Intramuscular Once Zorina Mallin, MD      . Derrill Memo ON 09/19/2017] thiamine (VITAMIN B-1) tablet 100 mg  100 mg Oral Daily Sheray Grist, MD       Current Outpatient Medications  Medication Sig Dispense Refill  . chlordiazePOXIDE (LIBRIUM) 25 MG capsule 14m PO TID x 1D, then 25-590mPO BID X 1D, then 25-5036mO QD X 1D (Patient not taking: Reported on 09/18/2017) 10 capsule 0    Musculoskeletal: Strength & Muscle Tone: within normal limits Gait & Station: normal Patient leans: N/A  Psychiatric Specialty Exam: Physical Exam  Constitutional: He is oriented to person, place, and time. He appears well-developed and well-nourished.  HENT:  Head: Normocephalic.  Respiratory: Effort normal.  Musculoskeletal: Normal range of motion.  Neurological: He is alert and oriented to person, place, and time.  Psychiatric: Thought content normal. His mood appears anxious. His affect is not labile. His speech is not rapid and/or pressured. He is agitated. Cognition and memory are normal. He expresses impulsivity. He exhibits a depressed mood.    Review of Systems  Psychiatric/Behavioral: Positive for depression, substance abuse and suicidal ideas. Negative for hallucinations and memory loss. The patient is nervous/anxious. The patient does not have insomnia.   All other systems reviewed and are  negative.   Blood pressure 100/78, pulse 99, temperature 97.7 F (36.5 C), temperature source Oral, resp. rate 18, SpO2 97 %.There is no height or weight on file to calculate BMI.  General Appearance: Casual  Eye Contact:  Good  Speech:  Clear and Coherent and Normal Rate  Volume:  Normal  Mood:  Anxious, Depressed and Irritable  Affect:  Congruent and Depressed  Thought Process:  Coherent and Linear  Orientation:  Full (Time, Place, and Person)  Thought Content:  Logical  Suicidal Thoughts:  Yes.  without intent/plan  Homicidal Thoughts:  No  Memory:  Immediate;   Good Recent;   Good Remote;   Fair  Judgement:  Poor  Insight:  Lacking  Psychomotor Activity:  Normal  Concentration:  Concentration: Good and Attention Span: Good  Recall:  Good  Fund of Knowledge:  Good  Language:  Good  Akathisia:  No  Handed:  Right  AIMS (if indicated):     Assets:  Agricultural consultant Housing Social Support  ADL's:  Intact  Cognition:  WNL  Sleep:        Treatment Plan Summary: Daily contact with patient to assess and evaluate symptoms and progress in treatment and Medication management (see MAR )  Disposition: Recommend psychiatric Inpatient admission when medically cleared. TTS to seek placement  Ethelene Hal, NP 09/18/2017 1:00 PM  Patient seen face-to-face for psychiatric evaluation, chart reviewed and case discussed with the physician extender and developed treatment plan. Reviewed the information documented and agree with the treatment plan. Corena Pilgrim, MD

## 2017-09-18 NOTE — Progress Notes (Signed)
Nurse talked to patient this afternoon about  using his coping skills for self control this afternoon and tonight.  Patient agreed.
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
## 2017-09-18 NOTE — ED Notes (Addendum)
Sheriff on unit to transfer pt to [facility] Adult unit per MD order. Personal property given to sheriff for transfer. Pt ambulatory off unit in law enforcement custody.

## 2017-09-18 NOTE — ED Notes (Signed)
Pt stated "I tried to hurt myself but I didn't want to kill myself."

## 2017-09-18 NOTE — ED Notes (Signed)
[place] canvas shoes, blue jeans, w/tan belt 1 orange sock, 1 pink sock, Toboggan, white shirt, plaid button down shirt, black cell phone, black wallet, [place] driver license, BBT debit card [id number], $6.00, gold chain, silver ring

## 2017-09-19 DIAGNOSIS — F1994 Other psychoactive substance use, unspecified with psychoactive substance-induced mood disorder: Secondary | ICD-10-CM

## 2017-09-19 LAB — TSH: TSH: 4.975 u[IU]/mL — ABNORMAL HIGH (ref 0.350–4.500)

## 2017-09-19 MED ORDER — LORAZEPAM 1 MG PO TABS: 1.0000 mg | ORAL_TABLET | Freq: Every day | ORAL | Status: DC

## 2017-09-19 MED ORDER — THIAMINE HCL 100 MG/ML IJ SOLN: 100.0000 mg | Freq: Once | INTRAMUSCULAR | Status: DC

## 2017-09-19 MED ORDER — LORAZEPAM 1 MG PO TABS: 1.0000 mg | ORAL_TABLET | Freq: Three times a day (TID) | ORAL | Status: AC

## 2017-09-19 MED ORDER — LORAZEPAM 1 MG PO TABS: 1.0000 mg | ORAL_TABLET | Freq: Two times a day (BID) | ORAL | Status: DC

## 2017-09-19 MED ORDER — ENSURE ENLIVE PO LIQD
237.0000 mL | Freq: Two times a day (BID) | ORAL | Status: DC
  Administered 2017-09-19 – 2017-09-21 (×5): 237 mL via ORAL

## 2017-09-19 MED ORDER — LORAZEPAM 1 MG PO TABS
1.0000 mg | ORAL_TABLET | Freq: Four times a day (QID) | ORAL | Status: AC
  Administered 2017-09-19 (×3): 1 mg via ORAL
  Filled 2017-09-19 (×4): qty 1

## 2017-09-19 MED ORDER — ONDANSETRON 4 MG PO TBDP: 4.0000 mg | ORAL_TABLET | Freq: Four times a day (QID) | ORAL | Status: DC | PRN

## 2017-09-19 NOTE — BHH Suicide Risk Assessment (Signed)
[place] INPATIENT:  Family/Significant Other Suicide Prevention Education  Suicide Prevention Education:  Education Completed: Mark Mcdaniel (pt's grandmother) [phone] has been identified by the patient as the family member/significant other with whom the patient will be residing, and identified as the person(s) who will aid the patient in the event of a mental health crisis (suicidal ideations/suicide attempt).  With written consent from the patient, the family member/significant other has been provided the following suicide prevention education, prior to the and/or following the discharge of the patient.  The suicide prevention education provided includes the following:  Suicide risk factors  Suicide prevention and interventions  National Suicide Hotline telephone number  [facility] assessment telephone number  [facility] Emergency Assistance [place] and/or Residential Mobile Crisis Unit telephone number  Request made of family/significant other to:  Remove weapons (e.g., guns, rifles, knives), all items previously/currently identified as safety concern.    Remove drugs/medications (over-the-counter, prescriptions, illicit drugs), all items previously/currently identified as a safety concern.  The family member/significant other verbalizes understanding of the suicide prevention education information provided.  The family member/significant other agrees to remove the items of safety concern listed above.  SPE and aftercare plan reviewed with pt's grandmother. She has no safety concerns regarding him returning home. She states that pt is sounding "more like the old Einar Pheasant" on the phone and is pleased with his progress. She hopes that he can stay in the hospital until Wednesday in order to get more from the groups and treatment. Pt's grandmother confirmed that pt does not have access to weapons/firearms.    Avelina Laine LCSW 09/19/2017, 2:21 PM

## 2017-09-19 NOTE — Progress Notes (Signed)
D:  Patient's self inventory sheet, patient sleeps good, sleep medication helpful.  Good appetite, normal energy level, good concentration.  Rated depression 2.5, hopeless 1, anxiety 3.  Denied withdrawals.  Denied SI.  Denied physical problems.  Denied physical pain.  Goal is attend meetings and be more sociable, positive.  Plans to attend meetings.  Does have discharge plans. A:  Medications administered per MD orders.  Emotional support and encouragement given patient. R:  Denied SI and HI, contracts for safety.  Denied A/V hallucinations.  Safety maintained with 15 minute checks.
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
## 2017-09-19 NOTE — Tx Team (Signed)
Interdisciplinary Treatment and Diagnostic Plan Update  09/19/2017 Time of Session: 0830AM Mark Mcdaniel MRN: [id number]  Principal Diagnosis: Substance induced mood disorder ([place])  Secondary Diagnoses: Principal Problem:   Substance induced mood disorder ([place])   Current Medications:  Current Facility-Administered Medications  Medication Dose Route Frequency Provider Last Rate Last Dose  . acetaminophen (TYLENOL) tablet 650 mg  650 mg Oral Q6H PRN Ethelene Hal, NP      . alum & mag hydroxide-simeth (MAALOX/MYLANTA) 200-200-20 MG/5ML suspension 30 mL  30 mL Oral Q4H PRN Ethelene Hal, NP      . chlordiazePOXIDE (LIBRIUM) capsule 25 mg  25 mg Oral QID Nanci Pina, FNP   25 mg at 09/19/17 0748   Followed by  . [START ON 09/20/2017] chlordiazePOXIDE (LIBRIUM) capsule 25 mg  25 mg Oral TID Nanci Pina, FNP       Followed by  . [START ON 09/21/2017] chlordiazePOXIDE (LIBRIUM) capsule 25 mg  25 mg Oral BH-qamhs Starkes, Gayland Curry, FNP       Followed by  . [START ON 09/22/2017] chlordiazePOXIDE (LIBRIUM) capsule 25 mg  25 mg Oral Daily Starkes, Gayland Curry, FNP      . feeding supplement (ENSURE ENLIVE) (ENSURE ENLIVE) liquid 237 mL  237 mL Oral BID BM Sharma Covert, MD      . FLUoxetine (PROZAC) capsule 10 mg  10 mg Oral Daily Ethelene Hal, NP   10 mg at 09/19/17 0748  . gabapentin (NEURONTIN) capsule 200 mg  200 mg Oral BID Ethelene Hal, NP   200 mg at 09/19/17 0748  . hydrOXYzine (ATARAX/VISTARIL) tablet 25 mg  25 mg Oral Q6H PRN Ethelene Hal, NP   25 mg at 09/18/17 1710  . loperamide (IMODIUM) capsule 2-4 mg  2-4 mg Oral PRN Ethelene Hal, NP      . magnesium hydroxide (MILK OF MAGNESIA) suspension 30 mL  30 mL Oral Daily PRN Ethelene Hal, NP      . multivitamin with minerals tablet 1 tablet  1 tablet Oral Daily Ethelene Hal, NP   1 tablet at 09/19/17 0749  . nicotine (NICODERM CQ - dosed in mg/24 hours) patch 21  mg  21 mg Transdermal Daily Ethelene Hal, NP   21 mg at 09/19/17 0749  . ondansetron (ZOFRAN-ODT) disintegrating tablet 4 mg  4 mg Oral Q6H PRN Ethelene Hal, NP   4 mg at 09/18/17 1709  . thiamine (VITAMIN B-1) tablet 100 mg  100 mg Oral Daily Ethelene Hal, NP   100 mg at 09/19/17 0749  . traZODone (DESYREL) tablet 50 mg  50 mg Oral QHS PRN Ethelene Hal, NP   50 mg at 09/18/17 2122   PTA Medications: Medications Prior to Admission  Medication Sig Dispense Refill Last Dose  . chlordiazePOXIDE (LIBRIUM) 25 MG capsule 31m PO TID x 1D, then 25-559mPO BID X 1D, then 25-5057mO QD X 1D (Patient not taking: Reported on 09/18/2017) 10 capsule 0 Not Taking at Unknown time    Patient Stressors: Financial difficulties Health problems Legal issue Marital or family conflict Medication change or noncompliance Occupational concerns Substance abuse  Patient Strengths: Ability for insight Active sense of humor Average or above average intelligence Capable of independent living ComCuratornd of knowledge Motivation for treatment/growth Supportive family/friends  Treatment Modalities: Medication Management, Group therapy, Case management,  1 to 1 session with clinician, Psychoeducation, Recreational therapy.   Physician  Treatment Plan for Primary Diagnosis: Substance induced mood disorder ([place]) Long Term Goal(s): Improvement in symptoms so as ready for discharge Improvement in symptoms so as ready for discharge   Short Term Goals: Ability to identify changes in lifestyle to reduce recurrence of condition will improve Ability to verbalize feelings will improve Ability to disclose and discuss suicidal ideas Ability to demonstrate self-control will improve Ability to identify and develop effective coping behaviors will improve Ability to maintain clinical measurements within normal limits will improve Compliance with prescribed medications  will improve Ability to identify triggers associated with substance abuse/mental health issues will improve  Medication Management: Evaluate patient's response, side effects, and tolerance of medication regimen.  Therapeutic Interventions: 1 to 1 sessions, Unit Group sessions and Medication administration.  Evaluation of Outcomes: Not Met  Physician Treatment Plan for Secondary Diagnosis: Principal Problem:   Substance induced mood disorder ([place])  Long Term Goal(s): Improvement in symptoms so as ready for discharge Improvement in symptoms so as ready for discharge   Short Term Goals: Ability to identify changes in lifestyle to reduce recurrence of condition will improve Ability to verbalize feelings will improve Ability to disclose and discuss suicidal ideas Ability to demonstrate self-control will improve Ability to identify and develop effective coping behaviors will improve Ability to maintain clinical measurements within normal limits will improve Compliance with prescribed medications will improve Ability to identify triggers associated with substance abuse/mental health issues will improve     Medication Management: Evaluate patient's response, side effects, and tolerance of medication regimen.  Therapeutic Interventions: 1 to 1 sessions, Unit Group sessions and Medication administration.  Evaluation of Outcomes: Not Met   RN Treatment Plan for Primary Diagnosis: Substance induced mood disorder ([place]) Long Term Goal(s): Knowledge of disease and therapeutic regimen to maintain health will improve  Short Term Goals: Ability to remain free from injury will improve, Ability to verbalize frustration and anger appropriately will improve, Ability to demonstrate self-control and Ability to disclose and discuss suicidal ideas  Medication Management: RN will administer medications as ordered by provider, will assess and evaluate patient's response and provide education to patient for  prescribed medication. RN will report any adverse and/or side effects to prescribing provider.  Therapeutic Interventions: 1 on 1 counseling sessions, Psychoeducation, Medication administration, Evaluate responses to treatment, Monitor vital signs and CBGs as ordered, Perform/monitor CIWA, COWS, AIMS and Fall Risk screenings as ordered, Perform wound care treatments as ordered.  Evaluation of Outcomes: Not Met   LCSW Treatment Plan for Primary Diagnosis: Substance induced mood disorder ([place]) Long Term Goal(s): Safe transition to appropriate next level of care at discharge, Engage patient in therapeutic group addressing interpersonal concerns.  Short Term Goals: Engage patient in aftercare planning with referrals and resources, Facilitate patient progression through stages of change regarding substance use diagnoses and concerns and Identify triggers associated with mental health/substance abuse issues  Therapeutic Interventions: Assess for all discharge needs, 1 to 1 time with Social worker, Explore available resources and support systems, Assess for adequacy in community support network, Educate family and significant other(s) on suicide prevention, Complete Psychosocial Assessment, Interpersonal group therapy.  Evaluation of Outcomes: Not Met   Progress in Treatment: Attending groups: No. Participating in groups: No. New to unit. Continuing to assess.  Taking medication as prescribed: Yes. Toleration medication: Yes. Family/Significant other contact made: No, will contact:  pt's grandmother for collateral information/SPE. Pt also has legal guardian-CSW in process of finding out who this is in order to contact as well.  Patient understands diagnosis: No. Minimal insight Discussing patient identified problems/goals with staff: Yes. Medical problems stabilized or resolved: Yes. Denies suicidal/homicidal ideation: Yes. Issues/concerns per patient self-inventory: No. Other: n/a   New  problem(s) identified: No, Describe:  n/a  New Short Term/Long Term Goal(s): detox, medication management for mood stabilization; elimination of SI thoughts; development of comprehensive mental wellness/sobriety plan.   Patient Goals:  "To get help with suicidal thoughts and depression."   Discharge Plan or Barriers: CSW assessing for appropriate referrals. Pt lives with his grandmother. No current mental health providers.  [facility] pamphlet, Mobile Crisis information, and AA/NA information provided to patient for additional community support and resources.   Reason for Continuation of Hospitalization: Anxiety Depression Medication stabilization Suicidal ideation Withdrawal symptoms  Estimated Length of Stay: Thursday, 09/22/17  Attendees: Patient: 09/19/2017 8:51 AM  Physician:  Dr. Leverne Humbles MD; Dr. Mallie Darting MD 09/19/2017 8:51 AM  Nursing: Rise Paganini RN; Legrand Como RN 09/19/2017 8:51 AM  RN Care Manager:x 09/19/2017 8:51 AM  Social Worker: Janice Norrie LCSW 09/19/2017 8:51 AM  Recreational Therapist: x 09/19/2017 8:51 AM  Other: Lindell Spar NP 09/19/2017 8:51 AM  Other:  09/19/2017 8:51 AM  Other: 09/19/2017 8:51 AM    Scribe for Treatment Team: Avelina Laine, LCSW 09/19/2017 8:51 AM

## 2017-09-19 NOTE — Plan of Care (Signed)
Nurse discussed anxiety, depression, coping skills with patient. 

## 2017-09-19 NOTE — Progress Notes (Signed)
Recreation Therapy Notes  Date: 7.22.19 Time: 0930 Location: [address] Dayroom  Group Topic: Stress Management  Goal Area(s) Addresses:  Patient will verbalize importance of using healthy stress management.  Patient will identify positive emotions associated with healthy stress management.   Behavioral Response: Engaged  Intervention: Stress Management  Activity :  Guided Imagery.  LRT introduced the stress management technique of guided imagery.  LRT read a script that allowed patients to envision floating on a cloud.  Patients were to listen and follow along as LRT read script to engage in the activity.  Education:  Stress Management, Discharge Planning.   Education Outcome: Acknowledges edcuation/In group clarification offered/Needs additional education  Clinical Observations/Feedback: Pt attended and participated in group.     Victorino Sparrow, LRT/CTRS        Ria Comment, Amenda Duclos A 09/19/2017 1:40 PM
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
## 2017-09-19 NOTE — Progress Notes (Signed)
D.  Pt pleasant on approach, denies complaints at this time.  Pt was positive for evening AA group, observed engaged in appropriate interaction with peers on the unit.  Minimal s/s of withdrawal noted.  Pt denies SI/HI/AVH at this time.  A.  Support and encouragement offered, medication given as ordered  R.  Pt remains safe on the unit, will continue to monitor.

## 2017-09-20 ENCOUNTER — Other Ambulatory Visit: Payer: Self-pay

## 2017-09-20 LAB — HEPATITIS PANEL, ACUTE
HCV Ab: <0.1 s/co ratio (ref 0.0–0.9)
Hep A IgM: NEGATIVE
Hep B C IgM: NEGATIVE
Hepatitis B Surface Ag: NEGATIVE

## 2017-09-20 NOTE — Progress Notes (Addendum)
[facility] Advocate MD Progress Note  09/20/2017 2:09 PM Mark Mcdaniel  MRN:  [id number]  Subjective: Mark Mcdaniel reports, "I'm doing better. I had some great news few minutes ago. I called my grand-mother, she said we are getting my daughter today. I have not seen my daughter in a while. I think I'm feeling good now. I'm an alcoholic. When I'm going through things, I cut on my wrist, a relief mechanism for me. But, the other night, I was gonna cut real deep because of all the stressors surrounding me ( financial, unemployment, having to live with my grand-mother), but, my grand-mother stopped me. But, I'm feeling a lot better today".  Patient is a 27 year old male with a past psychiatric history significant for alcohol use disorder who was placed under involuntary commitment secondary to suicidal ideation.  The patient was brought to the [facility] emergency department.  The patient admitted to an alcohol use disorder.  He had been in the emergency room multiple times attempting outpatient detox.  He stated that "I am a pretty nice person most the time, but when I drink a change post ". He stated he was considering killing himself, but his grandmother stopped them. He admitted to increased psychosocial stressors including unemployment, inability to pay for his bills, and to care for what he thought properly for his daughter.  He has superficial wounds on his left forearm that came while he was drinking yesterday.  He stated his first drink was at age 69, and started drinking more at age 76.  He has a 41-year-old child.  He and his wife/significant other attempted to make it work, but it has not.  His blood alcohol in the emergency room was 361.  Urine drug screen was negative.  He was admitted to the hospital for evaluation and stabilization.  Mark Mcdaniel is seen, chart reviewed. The chart findings discussed with the treatment team. He presents alert, oriented & aware of situation. He is visible on the unit,  attending & participating in the group sessions. He is tolerating his treatment regimen. He currently denies any SIHI, AVH, delusional thoughts or paranoia. He does not appear to be responding to any internal stimuli. Mark Mcdaniel has agreed to continue his current plan of care already in progress. May be discharged tomorrow if stable.  Principal Problem: Substance induced mood disorder ([place])  Diagnosis:   Patient Active Problem List   Diagnosis Date Noted  . Alcohol abuse with alcohol-induced mood disorder ([place]) [F10.14] 09/18/2017  . Major depressive disorder, single episode, severe ([place]) [F32.2] 09/18/2017  . Substance induced mood disorder ([place]) [F19.94] 09/18/2017  . Suicidal thoughts [R45.851]    Total Time spent with patient: 25 minutes  Past Psychiatric History: Alcohol use disorder, MDD  Past Medical History:  Past Medical History:  Diagnosis Date  . ADHD (attention deficit hyperactivity disorder)   . Alcohol abuse   . Anxiety   . Depression     Past Surgical History:  Procedure Laterality Date  . HERNIA REPAIR     Performed when 44 months old   Family History:  Family History  Problem Relation Age of Onset  . Cancer Maternal Grandmother   . Diabetes Maternal Grandmother   . Cancer Paternal Grandmother    Family Psychiatric  History: See H&P  Social History:  Social History   Substance and Sexual Activity  Alcohol Use Yes  . Alcohol/week: 4.8 oz  . Types: 5 Shots of liquor, 3 Cans of beer per week  Comment: 2/5 daily, three beers daily     Social History   Substance and Sexual Activity  Drug Use Yes  . Frequency: 1.0 times per week  . Types: Marijuana   Comment: stated he quit THC one month ago    Social History   Socioeconomic History  . Marital status: Single    Spouse name: Not on file  . Number of children: Not on file  . Years of education: Not on file  . Highest education level: Not on file  Occupational History  . Not on file  Social Needs  .  Financial resource strain: Not on file  . Food insecurity:    Worry: Not on file    Inability: Not on file  . Transportation needs:    Medical: Not on file    Non-medical: Not on file  Tobacco Use  . Smoking status: Current Every Day Smoker    Packs/day: 1.50    Years: 15.00    Pack years: 22.50    Types: Cigarettes  . Smokeless tobacco: Never Used  Substance and Sexual Activity  . Alcohol use: Yes    Alcohol/week: 4.8 oz    Types: 5 Shots of liquor, 3 Cans of beer per week    Comment: 2/5 daily, three beers daily  . Drug use: Yes    Frequency: 1.0 times per week    Types: Marijuana    Comment: stated he quit THC one month ago  . Sexual activity: Not Currently  Lifestyle  . Physical activity:    Days per week: Not on file    Minutes per session: Not on file  . Stress: Not on file  Relationships  . Social connections:    Talks on phone: Not on file    Gets together: Not on file    Attends religious service: Not on file    Active member of club or organization: Not on file    Attends meetings of clubs or organizations: Not on file    Relationship status: Not on file  Other Topics Concern  . Not on file  Social History Narrative  . Not on file   Additional Social History:    Pain Medications: see mar Prescriptions: see mar Over the Counter: see mar History of alcohol / drug use?: Yes Longest period of sobriety (when/how long): unknown Negative Consequences of Use: Financial, Legal, Personal relationships Withdrawal Symptoms: Patient aware of relationship between substance abuse and physical/medical complications, Sweats, Tremors Name of Substance 1: alcohol 1 - Age of First Use: 27 yrs old 1 - Amount (size/oz): 2/5 liquor daily and 3 beers daily 1 - Frequency: daily 1 - Duration: ongoing 1 - Last Use / Amount: 09/17/2017  Sleep: Good  Appetite:  Good  Current Medications: Current Facility-Administered Medications  Medication Dose Route Frequency Provider  Last Rate Last Dose  . acetaminophen (TYLENOL) tablet 650 mg  650 mg Oral Q6H PRN Ethelene Hal, NP      . alum & mag hydroxide-simeth (MAALOX/MYLANTA) 200-200-20 MG/5ML suspension 30 mL  30 mL Oral Q4H PRN Ethelene Hal, NP      . feeding supplement (ENSURE ENLIVE) (ENSURE ENLIVE) liquid 237 mL  237 mL Oral BID BM Sharma Covert, MD   237 mL at 09/20/17 0944  . FLUoxetine (PROZAC) capsule 10 mg  10 mg Oral Daily Ethelene Hal, NP   10 mg at 09/20/17 0754  . gabapentin (NEURONTIN) capsule 200 mg  200 mg Oral BID Romilda Garret,  Billey Chang, NP   200 mg at 09/19/17 1655  . hydrOXYzine (ATARAX/VISTARIL) tablet 25 mg  25 mg Oral Q6H PRN Ethelene Hal, NP   25 mg at 09/18/17 1710  . loperamide (IMODIUM) capsule 2-4 mg  2-4 mg Oral PRN Ethelene Hal, NP      . LORazepam (ATIVAN) tablet 1 mg  1 mg Oral TID Nanci Pina, FNP       Followed by  . [START ON 09/21/2017] LORazepam (ATIVAN) tablet 1 mg  1 mg Oral BID Nanci Pina, FNP       Followed by  . [START ON 09/22/2017] LORazepam (ATIVAN) tablet 1 mg  1 mg Oral Daily Starkes, Takia S, FNP      . magnesium hydroxide (MILK OF MAGNESIA) suspension 30 mL  30 mL Oral Daily PRN Ethelene Hal, NP      . multivitamin with minerals tablet 1 tablet  1 tablet Oral Daily Ethelene Hal, NP   1 tablet at 09/20/17 0754  . nicotine (NICODERM CQ - dosed in mg/24 hours) patch 21 mg  21 mg Transdermal Daily Ethelene Hal, NP   21 mg at 09/20/17 0754  . ondansetron (ZOFRAN-ODT) disintegrating tablet 4 mg  4 mg Oral Q6H PRN Starkes, Takia S, FNP      . thiamine (B-1) injection 100 mg  100 mg Intramuscular Once Starkes, Takia S, FNP      . thiamine (VITAMIN B-1) tablet 100 mg  100 mg Oral Daily Ethelene Hal, NP   100 mg at 09/20/17 0754  . traZODone (DESYREL) tablet 50 mg  50 mg Oral QHS PRN Ethelene Hal, NP   50 mg at 09/19/17 2129   Lab Results:  Results for orders placed or performed  during the hospital encounter of 09/18/17 (from the past 48 hour(s))  Hepatic function panel     Status: Abnormal   Collection Time: 09/18/17  4:28 PM  Result Value Ref Range   Total Protein 7.5 6.5 - 8.1 g/dL   Albumin 4.4 3.5 - 5.0 g/dL   AST 95 (H) 15 - 41 U/L   ALT 117 (H) 0 - 44 U/L    Comment: Please note change in reference range.   Alkaline Phosphatase 54 38 - 126 U/L   Total Bilirubin 0.8 0.3 - 1.2 mg/dL   Bilirubin, Direct 0.1 0.0 - 0.2 mg/dL    Comment: Please note change in reference range.   Indirect Bilirubin 0.7 0.3 - 0.9 mg/dL    Comment: Performed at [facility], [place] [address] [place], [place] [address]  Hepatitis panel, acute     Status: None   Collection Time: 09/18/17  4:28 PM  Result Value Ref Range   Hepatitis B Surface Ag Negative Negative   HCV Ab <0.1 0.0 - 0.9 s/co ratio    Comment: (NOTE)                                  Negative:     < 0.8                             Indeterminate: 0.8 - 0.9  Positive:     > 0.9 The CDC recommends that a positive HCV antibody result be followed up with a HCV Nucleic Acid Amplification test ([id number]). Performed At: [facility] [place], [place] [id number] Rush Farmer MD GE:[id number]    Hep A IgM Negative Negative   Hep B C IgM Negative Negative  Ethanol     Status: None   Collection Time: 09/18/17  4:28 PM  Result Value Ref Range   Alcohol, Ethyl (B) <10 <10 mg/dL    Comment: (NOTE) Lowest detectable limit for serum alcohol is 10 mg/dL. For medical purposes only. Performed at [facility], [place] [address], [place], [place] [address]   Lipid panel     Status: None   Collection Time: 09/18/17  7:30 PM  Result Value Ref Range   Cholesterol 173 0 - 200 mg/dL   Triglycerides 21 <150 mg/dL   HDL 126 >40 mg/dL   Total CHOL/HDL Ratio 1.4 RATIO   VLDL 4 0 - 40 mg/dL   LDL Cholesterol 43 0 - 99 mg/dL    Comment:         Total Cholesterol/HDL:CHD Risk Coronary Heart Disease Risk Table                     Men   Women  1/2 Average Risk   3.4   3.3  Average Risk       5.0   4.4  2 X Average Risk   9.6   7.1  3 X Average Risk  23.4   11.0        Use the calculated Patient Ratio above and the CHD Risk Table to determine the patient's CHD Risk.        ATP III CLASSIFICATION (LDL):  <100     mg/dL   Optimal  100-129  mg/dL   Near or Above                    Optimal  130-159  mg/dL   Borderline  160-189  mg/dL   High  >190     mg/dL   Very High Performed at [place] [address] [place], [place] [address]   Hemoglobin A1c     Status: None   Collection Time: 09/18/17  7:30 PM  Result Value Ref Range   Hgb A1c MFr Bld 5.5 4.8 - 5.6 %    Comment: (NOTE) Pre diabetes:          5.7%-6.4% Diabetes:              >6.4% Glycemic control for   <7.0% adults with diabetes    Mean Plasma Glucose 111.15 mg/dL    Comment: Performed at [place] [address], [place], [place] [address]  TSH     Status: Abnormal   Collection Time: 09/19/17  6:16 AM  Result Value Ref Range   TSH 4.975 (H) 0.350 - 4.500 uIU/mL    Comment: Performed by a 3rd Generation assay with a functional sensitivity of <=0.01 uIU/mL. Performed at [facility], [place] [address], [place], [place] [address]    Blood Alcohol level:  Lab Results  Component Value Date   ETH <10 09/18/2017   ETH 361 (HH) 56/38/7564   Metabolic Disorder Labs: Lab Results  Component Value Date   HGBA1C 5.5 09/18/2017   MPG 111.15 09/18/2017   No results found for: PROLACTIN Lab Results  Component Value Date   CHOL 173 09/18/2017   TRIG 21 09/18/2017   HDL 126 09/18/2017   CHOLHDL 1.4 09/18/2017   VLDL 4 09/18/2017   LDLCALC 43 09/18/2017   Physical Findings: AIMS: Facial and Oral Movements Muscles of Facial Expression: None, normal Lips and Perioral Area: None, normal Jaw: None,  normal Tongue: None, normal,Extremity Movements Upper (arms, wrists, hands, fingers): None, normal Lower (legs, knees, ankles, toes): None, normal, Trunk Movements Neck, shoulders, hips: None, normal, Overall Severity Severity of abnormal movements (highest score from questions above): None, normal Incapacitation due to abnormal movements: None, normal Patient's awareness of abnormal movements (rate only patient's report): No Awareness, Dental Status Current problems with teeth and/or dentures?: No Does patient usually wear dentures?: No  CIWA:  CIWA-Ar Total: 0 COWS:  COWS Total Score: 1  Musculoskeletal: Strength & Muscle Tone: within normal limits Gait & Station: normal Patient leans: N/A  Psychiatric Specialty Exam: Physical Exam  Nursing note and vitals reviewed.   ROS  Blood pressure 115/86, pulse 90, temperature 97.7 F (36.5 C), temperature source Oral, resp. rate 16, height 6' (1.829 m), weight 56.2 kg (124 lb), SpO2 99 %.Body mass index is 16.82 kg/m.  General Appearance: Casual  Eye Contact:  Fair  Speech:  Slow  Volume:  Decreased  Mood:  Depressed  Affect:  Congruent  Thought Process:  Coherent  Orientation:  Full (Time, Place, and Person)  Thought Content:  Logical  Suicidal Thoughts:  No  Homicidal Thoughts:  No  Memory:  Immediate;   Fair Recent;   Fair Remote;   Fair  Judgement:  Impaired  Insight:  Fair  Psychomotor Activity:  Increased  Concentration:  Concentration: Fair and Attention Span: Fair  Recall:  [employer] of Knowledge:  Fair  Language:  Fair  Akathisia:  Negative  Handed:  Right  AIMS (if indicated):     Assets:  Communication Skills Desire for Improvement Physical Health Resilience Social Support  ADL's:  Intact  Cognition:  WNL  Sleep:  Number of Hours: 6.25     Treatment Plan Summary: Daily contact with patient to assess and evaluate symptoms and progress in treatment.  - Continue inpatient hospitalization.  - Will  continue today 09/20/2017 plan as below except where it is noted.  Alcohol withdrawal symptoms.      - Continue the Ativan detox regimen as recommended.  Depression.     - Continue Fluoxetine 10 mg po daily.  Agitation/substance withdrawal symptoms.      - Continue gabapentin 200 mg bid.  Nicotine withdrawal.     - Continue the Nicoderm patch topically Q 24 hours.  Insomnia.      - Continue Trazodone 50 mg po prn Q hs.  Patient to continue to attend & participate in the group counseling sessions.  Discharge disposition plan ongoing.  Lindell Spar, NP, PMHNP, FNP-Bc 09/20/2017, 2:09 PM    ..Agree with NP Progress Note

## 2017-09-20 NOTE — Plan of Care (Signed)
Pt progressing in the following metrics  D: pt found in the dayroom interacting with his peers. Pt compliant with medication administration. Pt states he slept well last night. Pt declined most of his meds stating he only wants Prozac. Pt denies any withdrawal symptoms. Pt denies any physical pain at this time. Pt rates his depression/hopelessness/anxiety a 0/0/1 out of 10 respectively. Pt states his goal for today is read his AA book and continue the meetings. Pt denies any si/hi/ah/vh and verbally agrees to approach staff if these become apparent.  A: pt provided support and encouragement. Pt provided medications per protocol and standing orders. Q75m safety checks implemented and continued.  R: pt safe on the unit. Will continue to monitor.   Problem: Education: Goal: Emotional status will improve Outcome: Progressing Goal: Mental status will improve Outcome: Progressing   Problem: Activity: Goal: Interest or engagement in activities will improve Outcome: Progressing Goal: Sleeping patterns will improve Outcome: Progressing   Problem: Physical Regulation: Goal: Ability to maintain clinical measurements within normal limits will improve Outcome: Progressing   Problem: Health Behavior/Discharge Planning: Goal: Identification of resources available to assist in meeting health care needs will improve Outcome: Progressing   Problem: Medication: Goal: Compliance with prescribed medication regimen will improve Outcome: Progressing   Problem: Safety: Goal: Ability to remain free from injury will improve Outcome: Progressing

## 2017-09-20 NOTE — BHH Group Notes (Signed)
[facility] Mental [facility] Association Group Therapy 09/20/2017 1:15pm  Type of Therapy: Mental Health Association Presentation  Participation Level: Active  Participation Quality: Attentive  Affect: Appropriate  Cognitive: Oriented  Insight: Developing/Improving  Engagement in Therapy: Engaged  Modes of Intervention: Discussion, Education and Socialization  Summary of Progress/Problems: Mark ([place]) Speaker came to talk about his personal journey with mental health. The pt processed ways by which to relate to the speaker. [place] speaker provided handouts and educational information pertaining to groups and services offered by the [facility]. Pt was engaged in speaker's presentation and was receptive to resources provided.    Mark Laine, LCSW 09/20/2017 3:01 PM

## 2017-09-20 NOTE — Progress Notes (Signed)
Recreation Therapy Notes  Animal-Assisted Activity (AAA) Program Checklist/Progress Notes Patient Eligibility Criteria Checklist & Daily Group note for Rec Tx Intervention  Date: 7.23.19 Time: 38 Location: [facility] Room   AAA/T Program Assumption of Risk Form signed by Patient/ or Parent Legal Guardian YES   Patient is free of allergies or sever asthma YES   Patient reports no fear of animals YES  Patient reports no history of cruelty to animals YES   Patient understands his/her participation is voluntary YES   Patient washes hands before animal contact YES   Patient washes hands after animal contact YES   Behavioral Response: Engaged  Education: Contractor, Appropriate Animal Interaction   Education Outcome: Acknowledges understanding/In group clarification offered/Needs additional education.   Clinical Observations/Feedback:  Pt attended and participated in activity.    Victorino Sparrow, LRT/CTRS         Victorino Sparrow A 09/20/2017 3:38 PM

## 2017-09-20 NOTE — Progress Notes (Signed)
Pt attended AA group this evening.  

## 2017-09-20 NOTE — Progress Notes (Signed)
Pt reports he had a good day.  He denies having any withdrawal symptoms at this time, and says he does not need the Ativan 1 mg ordered for the night.  He did take Trazodone 50 mg for sleep.  He voices no needs or concerns at this time.  He has been pleasant and cooperative.  He makes his needs known to staff.  Support and encouragement offered.  Discharge plans are in process.  He plans to return to his grandmother's home.  Safety maintained with q15 minute checks.

## 2017-09-21 MED ORDER — NICOTINE 21 MG/24HR TD PT24: 21.0000 mg | MEDICATED_PATCH | Freq: Every day | TRANSDERMAL | 0 refills | Status: DC

## 2017-09-21 MED ORDER — FLUOXETINE HCL 10 MG PO CAPS: 10.0000 mg | ORAL_CAPSULE | Freq: Every day | ORAL | 0 refills | Status: DC

## 2017-09-21 MED ORDER — HYDROXYZINE HCL 25 MG PO TABS: ORAL_TABLET | ORAL | 0 refills | Status: DC

## 2017-09-21 MED ORDER — TRAZODONE HCL 50 MG PO TABS: 50.0000 mg | ORAL_TABLET | Freq: Every evening | ORAL | 0 refills | Status: DC | PRN

## 2017-09-21 MED ORDER — GABAPENTIN 100 MG PO CAPS: 200.0000 mg | ORAL_CAPSULE | Freq: Two times a day (BID) | ORAL | 0 refills | Status: DC

## 2017-09-21 NOTE — Progress Notes (Signed)
  [facility] Adult Case Management Discharge Plan :  Will you be returning to the same living situation after discharge:  Yes,  home  At discharge, do you have transportation home?: Yes,  grandmother Do you have the ability to pay for your medications: Yes,  mental health  Release of information consent forms completed and submitted to medical records by CSW.  Patient to Follow up at: Follow-up Information    [facility] Follow up on 09/28/2017.   Specialty:  Behavioral Health Why:  Hospital follow-up appointment on Wednesday, 7/30/ at 8:00AM. Please bring: photo ID, social security card, any proof of household income if you have it, and hospital discharge paperwork. Thank you. Contact information: [place] [address] [phone]           Next level of care provider has access to [place] and Suicide Prevention discussed: Yes,  SPE completed with pt's grandmother. SPI pamphlet and mobile crisis information provided to pt.   Have you used any form of tobacco in the last 30 days? (Cigarettes, Smokeless Tobacco, Cigars, and/or Pipes): Yes  Has patient been referred to the Quitline?: Patient refused referral  Patient has been referred for addiction treatment: Yes  Avelina Laine, LCSW 09/21/2017, 9:14 AM

## 2017-09-21 NOTE — Progress Notes (Signed)
Pt reports he had a good day.  He denies SI/HI/AVH.  He denies having any withdrawal symptoms at this time.  He has been refusing the detox medications, stating he does not need them.  He has been observed in the dayroom this evening talking with peers and interacting appropriately.  He has been pleasant and cooperative with staff.  Pt makes his needs known to staff.  He took a sleep aid per request at bedtime.  Support and encouragement offered.  Discharge plans are in process.  Safety maintained with q15 minute checks.

## 2017-09-21 NOTE — Discharge Summary (Addendum)
Physician Discharge Summary Note  Patient:  Mark Mcdaniel is an 27 y.o., male  MRN:  [id number]  DOB:  08-31-1990  Patient phone:  [phone] (home)   Patient address:   [address] [place] [address],   Total Time spent with patient: Greater than 30 minutes  Date of Admission:  09/18/2017  Date of Discharge: 09-21-17  Reason for Admission:  Suicidal ideations & difficulty coping with personal stressors.  Principal Problem: Substance induced mood disorder [facility])  Discharge Diagnoses: Patient Active Problem List   Diagnosis Date Noted  . Alcohol abuse with alcohol-induced mood disorder ([place]) [F10.14] 09/18/2017  . Major depressive disorder, single episode, severe ([place]) [F32.2] 09/18/2017  . Substance induced mood disorder ([place]) [F19.94] 09/18/2017  . Suicidal thoughts [R45.851]    Past Psychiatric History: Alcohol use disorder, chronic, MDD  Past Medical History:  Past Medical History:  Diagnosis Date  . ADHD (attention deficit hyperactivity disorder)   . Alcohol abuse   . Anxiety   . Depression     Past Surgical History:  Procedure Laterality Date  . HERNIA REPAIR     Performed when 36 months old   Family History:  Family History  Problem Relation Age of Onset  . Cancer Maternal Grandmother   . Diabetes Maternal Grandmother   . Cancer Paternal Grandmother    Family Psychiatric  History: See H&P  Social History:  Social History   Substance and Sexual Activity  Alcohol Use Yes  . Alcohol/week: 4.8 oz  . Types: 5 Shots of liquor, 3 Cans of beer per week   Comment: 2/5 daily, three beers daily     Social History   Substance and Sexual Activity  Drug Use Yes  . Frequency: 1.0 times per week  . Types: Marijuana   Comment: stated he quit THC one month ago    Social History   Socioeconomic History  . Marital status: Single    Spouse name: Not on file  . Number of children: Not on file  . Years of education: Not on file  . Highest education  level: Not on file  Occupational History  . Not on file  Social Needs  . Financial resource strain: Not on file  . Food insecurity:    Worry: Not on file    Inability: Not on file  . Transportation needs:    Medical: Not on file    Non-medical: Not on file  Tobacco Use  . Smoking status: Current Every Day Smoker    Packs/day: 1.50    Years: 15.00    Pack years: 22.50    Types: Cigarettes  . Smokeless tobacco: Never Used  Substance and Sexual Activity  . Alcohol use: Yes    Alcohol/week: 4.8 oz    Types: 5 Shots of liquor, 3 Cans of beer per week    Comment: 2/5 daily, three beers daily  . Drug use: Yes    Frequency: 1.0 times per week    Types: Marijuana    Comment: stated he quit THC one month ago  . Sexual activity: Not Currently  Lifestyle  . Physical activity:    Days per week: Not on file    Minutes per session: Not on file  . Stress: Not on file  Relationships  . Social connections:    Talks on phone: Not on file    Gets together: Not on file    Attends religious service: Not on file    Active member of club or  organization: Not on file    Attends meetings of clubs or organizations: Not on file    Relationship status: Not on file  Other Topics Concern  . Not on file  Social History Narrative  . Not on file   Hospital Course: (Per Md's admission SRA): Patient is a 27 year old male with a past psychiatric history significant for alcohol use disorder who was placed under involuntary commitment secondary to suicidal ideation. The patient was brought to the [facility] emergency department. The patient admitted to an alcohol use disorder.  He had been in the emergency room multiple times attempting outpatient detox. He stated that "I am a pretty nice person most the time, but when I drink a change post ".  He stated he was considering killing himself, but his grandmother stopped them.  He admitted to increased psychosocial stressors including  unemployment, inability to pay for his bills, and to care for what he thought properly for his daughter.  He has superficial wounds on his left forearm that came while he was drinking yesterday.  He stated his first drink was at age 15, and started drinking more at age 48.  He has a 21-year-old child.  He and his wife/significant other attempted to make it work, but it has not.  His blood alcohol in the emergency room was 361.  Urine drug screen was negative.  He was admitted to the hospital for evaluation and stabilization.  Mark Mcdaniel was admitted to the [facility] hospital with a BAL of 361 per toxicology tests results & UDS negative of all other drugs. He admitted having been drinking a lot & considered himself an alcoholic. He came in to this hospital under an IVC petition due to suicidal threats. He was admitted for alcohol detoxification & mood stabilization treatments. Mark Mcdaniel's recent lab reports indicated elevated liver enzymes (AST, ALT), possibly from chronic alcoholism. As a result, not a candidate for Librium detoxification treatment protocols. This is because Librium is a long acting Benzodiazepine used in the detoxification treatment for alcohol & other Benzodiazepine intoxications. This Librium is not suitable for detox treatment in an individual with elevated liver enzymes such as noted above. His detoxification treatment was achieved using Ativan detox regimen on a tapering dose format. Ativan is short acting regimen (has a short half-life). It does not stay long in the system. By using Ativan for this detox treatment, Mark Mcdaniel received a cleaner detoxification treatment without the lingering adverse effects of the Librium capsules in his system. He was enrolled in the group counseling sessions, AA/NA meetings being offered and held on this unit. He participated and learned coping skills. He tolerated his treatment regimen without any significant adverse effects and or reactions reported.  Besides the  detoxification treatments, Mark Mcdaniel was also medicated & discharged on; Prozac 10 mg for depression, Hydroxyzine 25 mg prn for anxiety, Gabapentin 200 mg for agitation/substance withdrawal syndrome, Trazodone 50 mg prn for insomnia & Nicotine patch 21 mg for smoking cessation. He received other medication regimen as there were no other medical issues presented. He tolerated his treatment regimen without any adverse effects or reactions reported.   Mark Mcdaniel has completed detoxification treatment and his mood is stable. This is evidenced by his reports of improved mood & absence of substance withdrawal symptoms. He has asked to be discharged to his home to see his young daughter that he has not seen in a while. He is encouraged to take his medications as recommended, go to his follow-up care visits &  join/attend weekly AA/NA meetings being offered and held within his community to help him maintain maximum sobriety.   Upon discharge,Mark Mcdaniel presents mentally & medically stable. He adamantly denies any suicidal, homicidal ideations, auditory, visual hallucinations, delusional thoughts, paranoia & or substance withdrawal symptoms. He left [facility] with all personal belongings in no apparent distress. He received a 7 days worth supply samples of his [facility] discharge medications provided by [facility] pharmacy.  Physical Findings: AIMS: Facial and Oral Movements Muscles of Facial Expression: None, normal Lips and Perioral Area: None, normal Jaw: None, normal Tongue: None, normal,Extremity Movements Upper (arms, wrists, hands, fingers): None, normal Lower (legs, knees, ankles, toes): None, normal, Trunk Movements Neck, shoulders, hips: None, normal, Overall Severity Severity of abnormal movements (highest score from questions above): None, normal Incapacitation due to abnormal movements: None, normal Patient's awareness of abnormal movements (rate only patient's report): No Awareness, Dental Status Current problems with teeth  and/or dentures?: No Does patient usually wear dentures?: No  CIWA:  CIWA-Ar Total: 0 COWS:  COWS Total Score: 1  Musculoskeletal: Strength & Muscle Tone: within normal limits Gait & Station: normal Patient leans: N/A  Psychiatric Specialty Exam: Physical Exam  Constitutional: He is oriented to person, place, and time. He appears well-developed.  HENT:  Head: Normocephalic.  Eyes: Pupils are equal, round, and reactive to light.  Neck: Normal range of motion.  Cardiovascular: Normal rate.  Respiratory: Effort normal.  GI: Soft.  Genitourinary:  Genitourinary Comments: Deferred  Musculoskeletal: Normal range of motion.  Neurological: He is alert and oriented to person, place, and time.  Skin: Skin is warm and dry.    Review of Systems  Constitutional: Negative.   HENT: Negative.   Eyes: Negative.   Respiratory: Negative.   Cardiovascular: Negative.  Negative for chest pain and palpitations.  Gastrointestinal: Negative.   Genitourinary: Negative.   Musculoskeletal: Negative.   Skin: Negative.   Neurological: Negative.   Endo/Heme/Allergies: Negative.   Psychiatric/Behavioral: Positive for depression (Stable) and substance abuse (Hx. alcoholism, chronic). Negative for hallucinations, memory loss and suicidal ideas. The patient has insomnia (Stable). The patient is not nervous/anxious.     Blood pressure 120/89, pulse 61, temperature 97.6 F (36.4 C), temperature source Oral, resp. rate 16, height 6' (1.829 m), weight 56.2 kg (124 lb), SpO2 99 %.Body mass index is 16.82 kg/m.  See Md's SRA   Have you used any form of tobacco in the last 30 days? (Cigarettes, Smokeless Tobacco, Cigars, and/or Pipes): Yes  Has this patient used any form of tobacco in the last 30 days? (Cigarettes, Smokeless Tobacco, Cigars, and/or Pipes): Yes, an FDA-approved tobacco cessation medication was offered at discharge.  Blood Alcohol level:  Lab Results  Component Value Date   ETH <10 09/18/2017    ETH 361 (HH) 40/97/3532    Metabolic Disorder Labs:  Lab Results  Component Value Date   HGBA1C 5.5 09/18/2017   MPG 111.15 09/18/2017   No results found for: PROLACTIN Lab Results  Component Value Date   CHOL 173 09/18/2017   TRIG 21 09/18/2017   HDL 126 09/18/2017   CHOLHDL 1.4 09/18/2017   VLDL 4 09/18/2017   LDLCALC 43 09/18/2017   See Psychiatric Specialty Exam and Suicide Risk Assessment completed by Attending Physician prior to discharge.  Discharge destination:  Home  Is patient on multiple antipsychotic therapies at discharge:  No   Has Patient had three or more failed trials of antipsychotic monotherapy by history:  No  Recommended Plan for Multiple Antipsychotic  Therapies: NA  Allergies as of 09/21/2017      Reactions   Tape Other (See Comments)   Medical tapes irritates the patient's skin   Tea Nausea And Vomiting      Medication List    STOP taking these medications   chlordiazePOXIDE 25 MG capsule Commonly known as:  LIBRIUM     TAKE these medications     Indication  FLUoxetine 10 MG capsule Commonly known as:  PROZAC Take 1 capsule (10 mg total) by mouth daily. For depression Start taking on:  09/22/2017  Indication:  Generalized Anxiety Disorder, Major Depressive Disorder   gabapentin 100 MG capsule Commonly known as:  NEURONTIN Take 2 capsules (200 mg total) by mouth 2 (two) times daily. For agitation  Indication:  Alcohol Withdrawal Syndrome, Agitation   hydrOXYzine 25 MG tablet Commonly known as:  ATARAX/VISTARIL Take 1 tablet (25 mg) by mouth every 6 hours as needed: For anxiety.  Indication:  Feeling Anxious   nicotine 21 mg/24hr patch Commonly known as:  NICODERM CQ - dosed in mg/24 hours Place 1 patch (21 mg total) onto the skin daily. (May buy from over the counter): For smoking cessation Start taking on:  09/22/2017  Indication:  Nicotine Addiction   traZODone 50 MG tablet Commonly known as:  DESYREL Take 1 tablet (50 mg  total) by mouth at bedtime as needed for sleep.  Indication:  Trouble Sleeping      Follow-up Information    [facility] Follow up on 09/28/2017.   Specialty:  Behavioral Health Why:  Hospital follow-up appointment on Wednesday, 7/30/ at 8:00AM. Please bring: photo ID, social security card, any proof of household income if you have it, and hospital discharge paperwork. Thank you. Contact information: [place] [address] [phone]          Follow-up recommendations: Activity:  As tolerated Diet: As recommended by your primary care doctor. Keep all scheduled follow-up appointments as recommended.   Comments: Patient is instructed prior to discharge to: Take all medications as prescribed by his/her mental healthcare provider. Report any adverse effects and or reactions from the medicines to his/her outpatient provider promptly. Patient has been instructed & cautioned: To not engage in alcohol and or illegal drug use while on prescription medicines. In the event of worsening symptoms, patient is instructed to call the crisis hotline, 911 and or go to the nearest ED for appropriate evaluation and treatment of symptoms. To follow-up with his/her primary care provider for your other medical issues, concerns and or health care needs.   Signed: Lindell Spar, NP, pmhnp, fnp-bc 09/21/2017, 9:04 AM   Patient seen, Suicide Assessment Completed.  Disposition Plan Reviewed

## 2017-09-21 NOTE — Progress Notes (Signed)
Recreation Therapy Notes  Date: 7.24.19 Time: 0930 Location: [address] Dayroom  Group Topic: Stress Management  Goal Area(s) Addresses:  Patient will verbalize importance of using healthy stress management.  Patient will identify positive emotions associated with healthy stress management.   Behavioral Response: Engaged  Intervention: Stress Management  Activity : Meditation.  LRT introduced the stress management technique of meditation.  LRT played a meditation that focused on choices.  Patients were to follow along as meditation was read to fully engage in the activity.  Education:  Stress Management, Discharge Planning.   Education Outcome: Acknowledges edcuation/In group clarification offered/Needs additional education  Clinical Observations/Feedback: Pt attended and participated in activity.    Victorino Sparrow, LRT/CTRS         Ria Comment, Donaven Criswell A 09/21/2017 10:46 AM

## 2017-09-21 NOTE — Therapy (Signed)
Occupational Therapy Group Treatment Note  Date:  09/21/2017 Time:  10:57 AM  Group Topic/Focus:  Stress Management  Participation Level:  Active  Participation Quality:  Appropriate  Affect:  Animated  Cognitive:  Appropriate  Insight: Improving  Engagement in Group:  Engaged  Modes of Intervention:  Activity, Discussion, Education and Socialization  Additional Comments:     S: "I like to do oragami"   O: Education given on stress management and healthy coping mechanisms. Pt encouraged to brainstorm with other peers and discuss what has worked in the past vs what has not. Pts further encouraged to discuss new coping stress management strategies to implement this date. Art activity made to display preferred coping mechanisms, along with incorporating the stress management outlet of coloring/art. PMR script delivered to facilitate relaxation response to help significant engagement in BADL/IADL.   A: Pt presents to group with animated affect, appears very excited to engage in activity and discuss with others. Pt brainstormed activities with other peers, stating doing origami, watching movies, and petting animals are his most preferred activities. Pt engaged in art activity with success and was offering to assist other group members who were not as quick with instructions.   P: Pt provided with education on stress management activities to implement into daily routine. Handouts given to facilitate carryover when reintegrating into community   [facility], [place], OTR/L  [employer] 09/21/2017, 10:57 AM

## 2017-09-21 NOTE — BHH Suicide Risk Assessment (Signed)
[facility] Discharge Suicide Risk Assessment   Principal Problem: Substance induced mood disorder [facility]) Discharge Diagnoses:  Patient Active Problem List   Diagnosis Date Noted  . Alcohol abuse with alcohol-induced mood disorder ([place]) [F10.14] 09/18/2017  . Major depressive disorder, single episode, severe ([place]) [F32.2] 09/18/2017  . Substance induced mood disorder ([place]) [F19.94] 09/18/2017  . Suicidal thoughts [R45.851]     Total Time spent with patient: 30 minutes  Musculoskeletal: Strength & Muscle Tone: within normal limits Gait & Station: normal Patient leans: N/A  Psychiatric Specialty Exam: ROS no headache, no chest pain, no shortness of breath, no vomiting , no fever   Blood pressure 120/89, pulse 61, temperature 97.6 F (36.4 C), temperature source Oral, resp. rate 16, height 6' (1.829 m), weight 56.2 kg (124 lb), SpO2 99 %.Body mass index is 16.82 kg/m.  General Appearance: Well Groomed  Eye Contact::  Good  Speech:  Normal Rate409  Volume:  Normal  Mood:  reports mood is improved " I feel more positive "  Affect:  Appropriate and reactive  Thought Process:  Linear and Descriptions of Associations: Intact  Orientation:  Full (Time, Place, and Person)  Thought Content:  no hallucinations, no delusions, not internally preoccupied  Suicidal Thoughts:  No denies any suicidal or self injurious ideations, no homicidal or violent ideations  Homicidal Thoughts:  No  Memory:  recent and remote grossly intact   Judgement:  Other:  improving   Insight:  improving   Psychomotor Activity:  Normal  Concentration:  Good  Recall:  Good  Fund of Knowledge:Good  Language: Good  Akathisia:  Negative  Handed:  Right  AIMS (if indicated):     Assets:  Communication Skills Desire for Improvement Resilience  Sleep:  Number of Hours: 5.5  Cognition: WNL  ADL's:  Intact   Mental Status Per Nursing Assessment::   On Admission:  Suicidal ideation indicated by patient, Self-harm  thoughts, Self-harm behaviors  Demographic Factors:  27 year old male, single, has one daughter who is with grandmother, patient lives with grandmother, currently unemployed   Loss Factors: Alcohol abuse , unemployment , strained relationship with his child's mother  Historical Factors: History of alcohol use disorder , no prior psychiatric admissions, no prior history of suicidal attempts, history of self cutting, which he states occurred when under the influence of alcohol  Risk Reduction Factors:   Responsible for children under 102 years of age, Living with another person, especially a relative and Positive coping skills or problem solving skills  Continued Clinical Symptoms:  At this time alert, attentive, well related, well groomed, reports improved, normalized mood and presents euthymic, with full range of affect, no thought disorder, no suicidal or self injurious ideations, no homicidal or violent ideations, no hallucinations, no delusions, not internally preoccupied . No current withdrawal symptoms. Tolerating medications well thus far - Prozac . Denies current cravings for alcohol and describes high level of motivation in sobriety. Visible on unit, behavior in good control, pleasant on approach.   Cognitive Features That Contribute To Risk:  No gross cognitive deficits noted upon discharge. Is alert , attentive, and oriented x 3   Suicide Risk:  Mild:  Suicidal ideation of limited frequency, intensity, duration, and specificity.  There are no identifiable plans, no associated intent, mild dysphoria and related symptoms, good self-control (both objective and subjective assessment), few other risk factors, and identifiable protective factors, including available and accessible social support.  Follow-up Information    [facility] Follow up on  09/28/2017.   Specialty:  Behavioral Health Why:  Hospital follow-up appointment on Wednesday, 7/30/ at 8:00AM. Please bring: photo ID, social  security card, any proof of household income if you have it, and hospital discharge paperwork. Thank you. Contact information: [place] [address] [phone]           Plan Of Care/Follow-up recommendations:  Activity:  as tolerated  Diet:  regular Tests:  NA Other:  See below Patient is expressing readiness for discharge and is leaving unit in good spirits Plans to return home- lives with grandmother States he plans to attend AA meetings regularly. Follow up as above. Jenne Campus, MD 09/21/2017, 2:26 PM

## 2017-09-21 NOTE — Progress Notes (Signed)
Patient ID: Mark Mcdaniel, male   DOB: [birth year], 27 y.o.   MRN: [id number] Patient discharged to home/self care in the presence of family.  Patient denies SI, H and AVH upon discharge and acknowledges the need for continued treatment within the community.  Patient acknowledged understanding of all discharge instructions and receipt of all personal belongings from locker.

## 2018-03-27 ENCOUNTER — Ambulatory Visit (HOSPITAL_COMMUNITY)
Admission: RE | Admit: 2018-03-27 | Discharge: 2018-03-27 | Disposition: A | Discharge status: Home / Self Care | Payer: Self-pay | Attending: Psychiatry | Admitting: Psychiatry

## 2018-03-27 ENCOUNTER — Other Ambulatory Visit: Payer: Self-pay | Admitting: Behavioral Health

## 2018-03-27 DIAGNOSIS — Z833 Family history of diabetes mellitus: Secondary | ICD-10-CM | POA: Insufficient documentation

## 2018-03-27 DIAGNOSIS — Z91018 Allergy to other foods: Secondary | ICD-10-CM | POA: Insufficient documentation

## 2018-03-27 DIAGNOSIS — F1020 Alcohol dependence, uncomplicated: Secondary | ICD-10-CM | POA: Insufficient documentation

## 2018-03-27 DIAGNOSIS — F17210 Nicotine dependence, cigarettes, uncomplicated: Secondary | ICD-10-CM | POA: Insufficient documentation

## 2018-03-27 DIAGNOSIS — F329 Major depressive disorder, single episode, unspecified: Secondary | ICD-10-CM | POA: Insufficient documentation

## 2018-03-27 DIAGNOSIS — F909 Attention-deficit hyperactivity disorder, unspecified type: Secondary | ICD-10-CM | POA: Insufficient documentation

## 2018-03-27 DIAGNOSIS — F419 Anxiety disorder, unspecified: Secondary | ICD-10-CM | POA: Insufficient documentation

## 2018-03-27 DIAGNOSIS — Z888 Allergy status to other drugs, medicaments and biological substances status: Secondary | ICD-10-CM | POA: Insufficient documentation

## 2018-03-27 NOTE — H&P (Signed)
Behavioral Health Medical Screening Exam  Mark Mcdaniel is an 28 y.o. male.  Presents to behavioral health as a walk-in and was accompanied by grandmother.  Patient is requesting information for detox as he reports a recent relapse. Currently denies suicidal homicidal ideations.  Denies auditory or visual hallucinations.   Does report a history of anxiety and depression.  Patient reports he is followed by [facility] however has not made any follow-up appointments.  Patient reports a history of substance abuse use to beer and marijuana.  Support encouragement reassurance was provided  Total Time spent with patient: 15 minutes  Psychiatric Specialty Exam: Physical Exam  Constitutional: He is oriented to person, place, and time. He appears well-developed.  Neck: Normal range of motion. Neck supple.  Respiratory: Effort normal.  Musculoskeletal: Normal range of motion.  Neurological: He is alert and oriented to person, place, and time.  Skin: Skin is warm and dry.  Psychiatric: He has a normal mood and affect. His behavior is normal.    Review of Systems  Psychiatric/Behavioral: Positive for depression and substance abuse. Negative for hallucinations and memory loss. The patient is not nervous/anxious and does not have insomnia.     Blood pressure (!) 156/111, pulse (!) 106, temperature 98.6 F (37 C), temperature source Oral, resp. rate 18, SpO2 98 %.There is no height or weight on file to calculate BMI.  General Appearance: Casual  Eye Contact:  Fair  Speech:  Clear and Coherent  Volume:  Normal  Mood:  Anxious and Depressed  Affect:  Congruent  Thought Process:  Coherent  Orientation:  Full (Time, Place, and Person)  Thought Content:  WDL  Suicidal Thoughts:  No  Homicidal Thoughts:  No  Memory:  Immediate;   Fair Recent;   Fair Remote;   Fair  Judgement:  Fair  Insight:  Fair  Psychomotor Activity:  Normal  Concentration: Concentration: Fair  Recall:  [employer] of  Knowledge:Fair  Language: Fair  Akathisia:  No  Handed:  Right  AIMS (if indicated):     Assets:  Communication Skills Desire for Improvement Resilience Social Support Talents/Skills  Sleep:       Musculoskeletal: Strength & Muscle Tone: within normal limits Gait & Station: normal Patient leans: N/A  Blood pressure (!) 156/111, pulse (!) 106, temperature 98.6 F (37 C), temperature source Oral, resp. rate 18, SpO2 98 %.  Recommendations: Patient was provided with additional outpatient resources for substance abuse treatment facilities. Patient to follow-up with primary care provider for elevated blood pressure  Based on my evaluation the patient does not appear to have an emergency medical condition.  Derrill Center, NP 03/27/2018, 7:08 PM

## 2018-03-27 NOTE — BH Assessment (Signed)
Assessment Note  Mark Mcdaniel is an 28 y.o. male presenting voluntarily to [facility] requesting assistance with alcohol use. Patient is accompanied by his grandmother, Grounds Sjogren, present for assessment and provides collateral information. Patient states that he "relapsed hard" about 4 weeks ago after 3 months of sobriety. Patient reports drinking 3 40 oz beers daily and using 1/4 ounce of marijuana per month. Patient's longest period of sobriety was 6 months. Patient has 1 prior hospitalization at [facility] in 2019 for suicidal ideation. Patient denies SI/HI/AVH. Patient denies any self harming behavior. Patient reports he receives medication management and out patient therapy services at [facility]. Patient states after he was discharged from the hospital he attended [place] meetings for a period of time but stopped going.Patient denies any current criminal charges or history of abuse.   Patient was alert and oriented x 4. He was dressed appropriately. His speech was logical/coherent and he made good eye contact. Patients affect was anxious and mood was congruent. His insight, judgement, and impulse control were limited. He did not appear to be responding to internal stimuli or experiencing delusional thought content at time of assessment.  Diagnosis: F10.20 Alcohol use disorder, severe  Past Medical History:  Past Medical History:  Diagnosis Date  . ADHD (attention deficit hyperactivity disorder)   . Alcohol abuse   . Anxiety   . Depression     Past Surgical History:  Procedure Laterality Date  . HERNIA REPAIR     Performed when 83 months old    Family History:  Family History  Problem Relation Age of Onset  . Cancer Maternal Grandmother   . Diabetes Maternal Grandmother   . Cancer Paternal Grandmother     Social History:  reports that he has been smoking cigarettes. He has a 22.50 pack-year smoking history. He has never used smokeless tobacco. He reports current alcohol use of about 8.0 standard  drinks of alcohol per week. He reports current drug use. Frequency: 1.00 time per week. Drug: Marijuana.  Additional Social History:  Alcohol / Drug Use Pain Medications: see MAR Prescriptions: see MAR Over the Counter: see MAR History of alcohol / drug use?: Yes Longest period of sobriety (when/how long): 6 months Substance #1 Name of Substance 1: Alcohol 1 - Age of First Use: 22 1 - Amount (size/oz): 3 40 oz beers 1 - Frequency: daily 1 - Duration: 4 weeks 1 - Last Use / Amount: 03/26/2018 Substance #2 Name of Substance 2: THC 2 - Age of First Use: UTA 2 - Amount (size/oz): 1/4 oz 2 - Frequency: monthly 2 - Duration: "years" 2 - Last Use / Amount: 03/26/2018  CIWA:   COWS:    Allergies:  Allergies  Allergen Reactions  . Tape Other (See Comments)    Medical tapes irritates the patient's skin  . Tea Nausea And Vomiting    Home Medications: (Not in a hospital admission)   OB/GYN Status:  No LMP for male patient.  General Assessment Data Location of Assessment: [facility] Assessment Services TTS Assessment: In system Is this a Tele or Face-to-Face Assessment?: Face-to-Face Is this an Initial Assessment or a Re-assessment for this encounter?: Initial Assessment Patient Accompanied by:: (grandmother, Stanton Kidney) Language Other than English: No Living Arrangements: Other (Comment)(with grandmother) What gender do you identify as?: Male Marital status: Single Maiden name: Prowse Pregnancy Status: No Living Arrangements: Other relatives Can pt return to current living arrangement?: Yes Admission Status: Voluntary Is patient capable of signing voluntary admission?: Yes Referral Source: Self/Family/Friend Insurance  type: none     Crisis Care Plan Living Arrangements: Other relatives     Risk to self with the past 6 months Suicidal Ideation: No Has patient been a risk to self within the past 6 months prior to admission? : No Suicidal Intent: No Has patient had any suicidal  intent within the past 6 months prior to admission? : No Is patient at risk for suicide?: No Suicidal Plan?: No Has patient had any suicidal plan within the past 6 months prior to admission? : No Access to Means: No What has been your use of drugs/alcohol within the last 12 months?: daily alcohol and THC Previous Attempts/Gestures: Yes How many times?: 1 Other Self Harm Risks: none reported Triggers for Past Attempts: None known Intentional Self Injurious Behavior: None Family Suicide History: No Recent stressful life event(s): Job Loss, Other (Comment)(relapse) Persecutory voices/beliefs?: No Depression: Yes Depression Symptoms: Feeling angry/irritable, Feeling worthless/self pity, Loss of interest in usual pleasures, Insomnia, Tearfulness Substance abuse history and/or treatment for substance abuse?: Yes Suicide prevention information given to non-admitted patients: Not applicable  Risk to Others within the past 6 months Homicidal Ideation: No Does patient have any lifetime risk of violence toward others beyond the six months prior to admission? : No Thoughts of Harm to Others: No Current Homicidal Intent: No Current Homicidal Plan: No Access to Homicidal Means: No Identified Victim: none  History of harm to others?: No Assessment of Violence: None Noted Violent Behavior Description: none Does patient have access to weapons?: No Criminal Charges Pending?: No Does patient have a court date: No Is patient on probation?: No  Psychosis Hallucinations: None noted Delusions: None noted  Mental Status Report Appearance/Hygiene: Unremarkable Eye Contact: Good Motor Activity: Freedom of movement Speech: Logical/coherent Level of Consciousness: Alert Mood: Anxious Affect: Anxious Anxiety Level: Moderate Thought Processes: Coherent, Relevant Judgement: Impaired Orientation: Person, Place, Time, Situation Obsessive Compulsive Thoughts/Behaviors: None  Cognitive  Functioning Concentration: Normal Memory: Recent Intact, Remote Intact Is patient IDD: No Insight: Fair Impulse Control: Poor Appetite: Poor Have you had any weight changes? : No Change Sleep: Decreased Total Hours of Sleep: 5 Vegetative Symptoms: None  ADLScreening [facility] Assessment Services) Patient's cognitive ability adequate to safely complete daily activities?: Yes Patient able to express need for assistance with ADLs?: Yes Independently performs ADLs?: Yes (appropriate for developmental age)  Prior Inpatient Therapy Prior Inpatient Therapy: Yes Prior Therapy Dates: [facility] Prior Therapy Facilty/Provider(s): 2019 Reason for Treatment: SI, alcohol use  Prior Outpatient Therapy Prior Outpatient Therapy: Yes Prior Therapy Dates: ongoing Prior Therapy Facilty/Provider(s): [facility] Reason for Treatment: depression, anxiety, alcohol use Does patient have an ACCT team?: No Does patient have Intensive In-House Services?  : No Does patient have [facility] services? : No Does patient have P4CC services?: No  ADL Screening (condition at time of admission) Patient's cognitive ability adequate to safely complete daily activities?: Yes Is the patient deaf or have difficulty hearing?: No Does the patient have difficulty seeing, even when wearing glasses/contacts?: No Does the patient have difficulty concentrating, remembering, or making decisions?: No Patient able to express need for assistance with ADLs?: Yes Does the patient have difficulty dressing or bathing?: No Independently performs ADLs?: Yes (appropriate for developmental age) Does the patient have difficulty walking or climbing stairs?: No Weakness of Legs: None Weakness of Arms/Hands: None  Home Assistive Devices/Equipment Home Assistive Devices/Equipment: None  Therapy Consults (therapy consults require a physician order) PT Evaluation Needed: No OT Evalulation Needed: No SLP Evaluation Needed: No Abuse/Neglect Assessment  (  Assessment to be complete while patient is alone) Abuse/Neglect Assessment Can Be Completed: Yes Physical Abuse: Denies Verbal Abuse: Denies Sexual Abuse: Denies Exploitation of patient/patient's resources: Denies Self-Neglect: Denies Values / Beliefs Cultural Requests During Hospitalization: None Spiritual Requests During Hospitalization: None Consults Spiritual Care Consult Needed: No Social Work Consult Needed: No Regulatory affairs officer (For Healthcare) Does Patient Have a Medical Advance Directive?: No Would patient like information on creating a medical advance directive?: No - Patient declined          Disposition: Per Ricky Ala, NP patient does not meet in patient criteria. Discharged with substance use resources. Disposition Initial Assessment Completed for this Encounter: Yes Disposition of Patient: Discharge Patient refused recommended treatment: No Mode of transportation if patient is discharged/movement?: Car Patient referred to: Outpatient clinic referral, [facility]  On Site Evaluation by:   Reviewed with Physician:    Orvis Brill 03/27/2018 7:07 PM

## 2018-07-07 ENCOUNTER — Observation Stay (HOSPITAL_COMMUNITY)
Admission: EM | Admit: 2018-07-07 | Discharge: 2018-07-08 | Disposition: A | Discharge status: Home / Self Care | Payer: Self-pay | Attending: Internal Medicine | Admitting: Internal Medicine

## 2018-07-07 ENCOUNTER — Other Ambulatory Visit: Payer: Self-pay

## 2018-07-07 ENCOUNTER — Encounter (HOSPITAL_COMMUNITY): Payer: Self-pay | Admitting: Student

## 2018-07-07 DIAGNOSIS — F1020 Alcohol dependence, uncomplicated: Secondary | ICD-10-CM | POA: Diagnosis present

## 2018-07-07 DIAGNOSIS — E872 Acidosis: Secondary | ICD-10-CM

## 2018-07-07 DIAGNOSIS — F10230 Alcohol dependence with withdrawal, uncomplicated: Secondary | ICD-10-CM

## 2018-07-07 DIAGNOSIS — E8729 Other acidosis: Secondary | ICD-10-CM | POA: Diagnosis present

## 2018-07-07 DIAGNOSIS — D72829 Elevated white blood cell count, unspecified: Secondary | ICD-10-CM | POA: Insufficient documentation

## 2018-07-07 DIAGNOSIS — F17210 Nicotine dependence, cigarettes, uncomplicated: Secondary | ICD-10-CM | POA: Insufficient documentation

## 2018-07-07 DIAGNOSIS — Z91048 Other nonmedicinal substance allergy status: Secondary | ICD-10-CM

## 2018-07-07 DIAGNOSIS — F1099 Alcohol use, unspecified with unspecified alcohol-induced disorder: Secondary | ICD-10-CM | POA: Insufficient documentation

## 2018-07-07 DIAGNOSIS — F10188 Alcohol abuse with other alcohol-induced disorder: Secondary | ICD-10-CM

## 2018-07-07 DIAGNOSIS — E860 Dehydration: Secondary | ICD-10-CM

## 2018-07-07 DIAGNOSIS — Z79899 Other long term (current) drug therapy: Secondary | ICD-10-CM | POA: Insufficient documentation

## 2018-07-07 DIAGNOSIS — F329 Major depressive disorder, single episode, unspecified: Secondary | ICD-10-CM | POA: Insufficient documentation

## 2018-07-07 DIAGNOSIS — Z1159 Encounter for screening for other viral diseases: Secondary | ICD-10-CM | POA: Insufficient documentation

## 2018-07-07 DIAGNOSIS — F10930 Alcohol use, unspecified with withdrawal, uncomplicated: Secondary | ICD-10-CM | POA: Diagnosis present

## 2018-07-07 DIAGNOSIS — R825 Elevated urine levels of drugs, medicaments and biological substances: Secondary | ICD-10-CM

## 2018-07-07 HISTORY — DX: Acidosis: E87.2

## 2018-07-07 HISTORY — DX: Other acidosis: E87.29

## 2018-07-07 LAB — COMPREHENSIVE METABOLIC PANEL
ALT: 21 U/L (ref 0–44)
AST: 28 U/L (ref 15–41)
Albumin: 4.5 g/dL (ref 3.5–5.0)
Alkaline Phosphatase: 78 U/L (ref 38–126)
Anion gap: 23 — ABNORMAL HIGH (ref 5–15)
BUN: 9 mg/dL (ref 6–20)
CO2: 17 mmol/L — ABNORMAL LOW (ref 22–32)
Calcium: 9.8 mg/dL (ref 8.9–10.3)
Chloride: 99 mmol/L (ref 98–111)
Creatinine, Ser: 1.19 mg/dL (ref 0.61–1.24)
GFR calc Af Amer: >60 mL/min (ref >60)
GFR calc non Af Amer: >60 mL/min (ref >60)
Glucose, Bld: 86 mg/dL (ref 70–99)
Potassium: 4.3 mmol/L (ref 3.5–5.1)
Sodium: 139 mmol/L (ref 135–145)
Total Bilirubin: 1.6 mg/dL — ABNORMAL HIGH (ref 0.3–1.2)
Total Protein: 7.6 g/dL (ref 6.5–8.1)

## 2018-07-07 LAB — CBC
HCT: 49.2 % (ref 39.0–52.0)
Hemoglobin: 16.7 g/dL (ref 13.0–17.0)
MCH: 31.9 pg (ref 26.0–34.0)
MCHC: 33.9 g/dL (ref 30.0–36.0)
MCV: 94.1 fL (ref 80.0–100.0)
Platelets: 316 10*3/uL (ref 150–400)
RBC: 5.23 MIL/uL (ref 4.22–5.81)
RDW: 13.3 % (ref 11.5–15.5)
WBC: 26.3 10*3/uL — ABNORMAL HIGH (ref 4.0–10.5)
nRBC: 0 % (ref 0.0–0.2)

## 2018-07-07 LAB — ETHANOL: Alcohol, Ethyl (B): <10 mg/dL (ref <10)

## 2018-07-07 LAB — SARS CORONAVIRUS 2 BY RT PCR (HOSPITAL ORDER, PERFORMED IN ~~LOC~~ HOSPITAL LAB): SARS Coronavirus 2: NEGATIVE

## 2018-07-07 LAB — BETA-HYDROXYBUTYRIC ACID: Beta-Hydroxybutyric Acid: 0.15 mmol/L (ref 0.05–0.27)

## 2018-07-07 LAB — PHOSPHORUS: Phosphorus: 2.9 mg/dL (ref 2.5–4.6)

## 2018-07-07 LAB — OSMOLALITY: Osmolality: 282 mOsm/kg (ref 275–295)

## 2018-07-07 LAB — MAGNESIUM: Magnesium: 2.2 mg/dL (ref 1.7–2.4)

## 2018-07-07 LAB — LIPASE, BLOOD: Lipase: 25 U/L (ref 11–51)

## 2018-07-07 LAB — LACTIC ACID, PLASMA: Lactic Acid, Venous: 1.1 mmol/L (ref 0.5–1.9)

## 2018-07-07 MED ORDER — LORAZEPAM 2 MG/ML IJ SOLN
0.0000 mg | Freq: Four times a day (QID) | INTRAMUSCULAR | Status: DC
  Administered 2018-07-07: 13:00:00 1 mg via INTRAVENOUS
  Filled 2018-07-07: qty 1

## 2018-07-07 MED ORDER — FAMOTIDINE IN NACL 20-0.9 MG/50ML-% IV SOLN
20.0000 mg | Freq: Once | INTRAVENOUS | Status: AC
  Administered 2018-07-07: 12:00:00 20 mg via INTRAVENOUS
  Filled 2018-07-07: qty 50

## 2018-07-07 MED ORDER — SODIUM CHLORIDE 0.9 % IV BOLUS
1000.0000 mL | Freq: Once | INTRAVENOUS | Status: AC
  Administered 2018-07-07: 12:00:00 1000 mL via INTRAVENOUS

## 2018-07-07 MED ORDER — ACETAMINOPHEN 650 MG RE SUPP: 650.0000 mg | Freq: Four times a day (QID) | RECTAL | Status: DC | PRN

## 2018-07-07 MED ORDER — THIAMINE HCL 100 MG/ML IJ SOLN
100.0000 mg | Freq: Every day | INTRAMUSCULAR | Status: DC
  Administered 2018-07-07: 100 mg via INTRAVENOUS
  Filled 2018-07-07: qty 2

## 2018-07-07 MED ORDER — ADULT MULTIVITAMIN W/MINERALS CH
1.0000 | ORAL_TABLET | Freq: Every day | ORAL | Status: DC
  Administered 2018-07-07 – 2018-07-08 (×2): 1 via ORAL
  Filled 2018-07-07 (×2): qty 1

## 2018-07-07 MED ORDER — LORAZEPAM 2 MG/ML IJ SOLN: 0.0000 mg | Freq: Two times a day (BID) | INTRAMUSCULAR | Status: DC

## 2018-07-07 MED ORDER — VITAMIN B-1 100 MG PO TABS
100.0000 mg | ORAL_TABLET | Freq: Every day | ORAL | Status: DC
  Administered 2018-07-08: 100 mg via ORAL
  Filled 2018-07-07: qty 1

## 2018-07-07 MED ORDER — LORAZEPAM 1 MG PO TABS: 0.0000 mg | ORAL_TABLET | Freq: Four times a day (QID) | ORAL | Status: DC

## 2018-07-07 MED ORDER — SODIUM CHLORIDE 0.9 % IV BOLUS
1000.0000 mL | Freq: Once | INTRAVENOUS | Status: AC
  Administered 2018-07-07: 13:00:00 1000 mL via INTRAVENOUS

## 2018-07-07 MED ORDER — LORAZEPAM 2 MG/ML IJ SOLN
1.0000 mg | Freq: Once | INTRAMUSCULAR | Status: AC
  Administered 2018-07-07: 12:00:00 1 mg via INTRAVENOUS
  Filled 2018-07-07: qty 1

## 2018-07-07 MED ORDER — ENOXAPARIN SODIUM 40 MG/0.4ML ~~LOC~~ SOLN
40.0000 mg | SUBCUTANEOUS | Status: DC
  Administered 2018-07-07: 16:00:00 40 mg via SUBCUTANEOUS
  Filled 2018-07-07: qty 0.4

## 2018-07-07 MED ORDER — DEXTROSE-NACL 5-0.9 % IV SOLN
INTRAVENOUS | Status: DC
  Administered 2018-07-07 (×2): via INTRAVENOUS

## 2018-07-07 MED ORDER — VITAMIN B-1 100 MG PO TABS: 100.0000 mg | ORAL_TABLET | Freq: Every day | ORAL | Status: DC

## 2018-07-07 MED ORDER — ONDANSETRON HCL 4 MG/2ML IJ SOLN: 4.0000 mg | Freq: Four times a day (QID) | INTRAMUSCULAR | Status: DC | PRN

## 2018-07-07 MED ORDER — DEXTROSE 50 % IV SOLN
1.0000 | Freq: Once | INTRAVENOUS | Status: AC
  Administered 2018-07-07: 13:00:00 50 mL via INTRAVENOUS
  Filled 2018-07-07: qty 50

## 2018-07-07 MED ORDER — LORAZEPAM 1 MG PO TABS: 0.0000 mg | ORAL_TABLET | Freq: Two times a day (BID) | ORAL | Status: DC

## 2018-07-07 MED ORDER — FOLIC ACID 1 MG PO TABS
1.0000 mg | ORAL_TABLET | Freq: Every day | ORAL | Status: DC
  Administered 2018-07-07 – 2018-07-08 (×2): 1 mg via ORAL
  Filled 2018-07-07 (×2): qty 1

## 2018-07-07 MED ORDER — ONDANSETRON HCL 4 MG PO TABS: 4.0000 mg | ORAL_TABLET | Freq: Four times a day (QID) | ORAL | Status: DC | PRN

## 2018-07-07 MED ORDER — ACETAMINOPHEN 325 MG PO TABS: 650.0000 mg | ORAL_TABLET | Freq: Four times a day (QID) | ORAL | Status: DC | PRN

## 2018-07-07 NOTE — Progress Notes (Signed)
Pt arrived to 3W17. A&O x4. Denies pain. POC has been provided to patient. Call bell within reach. Nurse will continue to monitor. Arby Barrette, RN

## 2018-07-07 NOTE — ED Provider Notes (Signed)
[facility] EMERGENCY DEPARTMENT Provider Note   CSN: [id number] Arrival date & time: 07/07/18  1114    History   Chief Complaint Chief Complaint  Patient presents with   Withdrawal    HPI Mark Mcdaniel is a 28 y.o. male with a hx of tobacco abuse, anxiety, depression, & EtOH abuse who presents to the ED via EMS with concern for EtOH withdrawal. Patient states he has been drinking daily since age 73. He states he previously drank liquor (1-2 fifths per day) recently had been drinking beer (2-3 40 ounce beers). He decided he wanted to stop drinking. Last EtOH intake was Tuesday (05/05) he is unsure what time. The next AM he woke up w/ nausea TNTC episodes of emesis and intermittent tremors. No alleviating/aggravating factors. Has vomited so much that he is having abdominal pain mostly in the epigastric area. Denies hematemesis, diarrhea, melena, hematochezia, seizure, hallucinations, chest pain, dyspnea, palpitations, or syncope. Denies SI/HI. Admits to marijuana use, no other drug use. Hx of EtOH withdrawal, states he has a hx fo DTs & has required medical admission for this in the past, no hx of withdrawal seizures.     HPI  Past Medical History:  Diagnosis Date   ADHD (attention deficit hyperactivity disorder)    Alcohol abuse    Anxiety    Depression     Patient Active Problem List   Diagnosis Date Noted   Alcohol abuse with alcohol-induced mood disorder ([place]) 09/18/2017   Major depressive disorder, single episode, severe ([place]) 09/18/2017   Substance induced mood disorder ([place]) 09/18/2017   Suicidal thoughts     Past Surgical History:  Procedure Laterality Date   HERNIA REPAIR     Performed when 39 months old        Home Medications    Prior to Admission medications   Medication Sig Start Date End Date Taking? Authorizing Provider  FLUoxetine (PROZAC) 10 MG capsule Take 1 capsule (10 mg total) by mouth daily. For depression 09/22/17    Lindell Spar I, NP  gabapentin (NEURONTIN) 100 MG capsule Take 2 capsules (200 mg total) by mouth 2 (two) times daily. For agitation 09/21/17   Lindell Spar I, NP  hydrOXYzine (ATARAX/VISTARIL) 25 MG tablet Take 1 tablet (25 mg) by mouth every 6 hours as needed: For anxiety. 09/21/17   Lindell Spar I, NP  nicotine (NICODERM CQ - DOSED IN MG/24 HOURS) 21 mg/24hr patch Place 1 patch (21 mg total) onto the skin daily. (May buy from over the counter): For smoking cessation 09/22/17   Lindell Spar I, NP  traZODone (DESYREL) 50 MG tablet Take 1 tablet (50 mg total) by mouth at bedtime as needed for sleep. 09/21/17   Encarnacion Slates, NP    Family History Family History  Problem Relation Age of Onset   Cancer Maternal Grandmother    Diabetes Maternal Grandmother    Cancer Paternal Grandmother     Social History Social History   Tobacco Use   Smoking status: Current Every Day Smoker    Packs/day: 1.50    Years: 15.00    Pack years: 22.50    Types: Cigarettes   Smokeless tobacco: Never Used  Substance Use Topics   Alcohol use: Yes    Alcohol/week: 8.0 standard drinks    Types: 5 Shots of liquor, 3 Cans of beer per week    Comment: 2/5 daily, three beers daily   Drug use: Yes    Frequency: 1.0 times per  week    Types: Marijuana    Comment: stated he quit THC one month ago   Allergies   Tape and Tea   Review of Systems Review of Systems  Constitutional: Negative for chills and fever.  Respiratory: Negative for shortness of breath.   Cardiovascular: Negative for chest pain.  Gastrointestinal: Positive for abdominal pain, nausea and vomiting. Negative for blood in stool, constipation and diarrhea.  Neurological: Positive for tremors. Negative for seizures, syncope and weakness.  Psychiatric/Behavioral: Negative for hallucinations, self-injury and suicidal ideas.  All other systems reviewed and are negative.   Physical Exam Updated Vital Signs BP 111/71    Pulse 80    Temp 98.5  F (36.9 C) (Oral)    Resp 19    Ht 6\' 4"  (1.93 m)    Wt 65.8 kg    SpO2 96%    BMI 17.65 kg/m    Physical Exam Vitals signs and nursing note reviewed.  Constitutional:      General: He is not in acute distress.    Appearance: He is well-developed. He is not toxic-appearing.  HENT:     Head: Normocephalic and atraumatic.  Eyes:     General:        Right eye: No discharge.        Left eye: No discharge.     Conjunctiva/sclera: Conjunctivae normal.  Neck:     Musculoskeletal: Neck supple.  Cardiovascular:     Rate and Rhythm: Normal rate and regular rhythm.  Pulmonary:     Effort: Pulmonary effort is normal. No respiratory distress.     Breath sounds: Normal breath sounds. No wheezing, rhonchi or rales.  Abdominal:     General: There is no distension.     Palpations: Abdomen is soft.     Tenderness: There is abdominal tenderness (mild epigastric region). There is no guarding or rebound. Negative signs include Murphy's sign and McBurney's sign.  Skin:    General: Skin is warm and dry.     Findings: No rash.  Neurological:     General: No focal deficit present.     Mental Status: He is alert and oriented to person, place, and time.     Comments: Clear speech. Intermittent tremulous activity noted to the hands on initial assessment.   Psychiatric:        Behavior: Behavior normal.        Thought Content: Thought content does not include homicidal or suicidal ideation.     Comments: Does not appear to be responding to internal stimuli.     ED Treatments / Results  Labs (all labs ordered are listed, but only abnormal results are displayed) Labs Reviewed  CBC - Abnormal; Notable for the following components:      Result Value   WBC 26.3 (*)    All other components within normal limits  COMPREHENSIVE METABOLIC PANEL - Abnormal; Notable for the following components:   CO2 17 (*)    Total Bilirubin 1.6 (*)    Anion gap 23 (*)    All other components within normal limits    LIPASE, BLOOD  ETHANOL  URINALYSIS, ROUTINE W REFLEX MICROSCOPIC  MAGNESIUM  RAPID URINE DRUG SCREEN, HOSP PERFORMED    EKG None  Radiology No results found.  Procedures Procedures (including critical care time)  Medications Ordered in ED Medications  LORazepam (ATIVAN) injection 0-4 mg (1 mg Intravenous Given 07/07/18 1231)    Or  LORazepam (ATIVAN) tablet 0-4 mg ( Oral See  Alternative 07/07/18 1231)  LORazepam (ATIVAN) injection 0-4 mg (has no administration in time range)    Or  LORazepam (ATIVAN) tablet 0-4 mg (has no administration in time range)  thiamine (VITAMIN B-1) tablet 100 mg ( Oral See Alternative 07/07/18 1230)    Or  thiamine (B-1) injection 100 mg (100 mg Intravenous Given 07/07/18 1230)  dextrose 5 %-0.9 % sodium chloride infusion ( Intravenous New Bag/Given 07/07/18 1321)  sodium chloride 0.9 % bolus 1,000 mL (1,000 mLs Intravenous New Bag/Given 07/07/18 1142)  LORazepam (ATIVAN) injection 1 mg (1 mg Intravenous Given 07/07/18 1140)  famotidine (PEPCID) IVPB 20 mg premix (0 mg Intravenous Stopped 07/07/18 1227)  dextrose 50 % solution 50 mL (50 mLs Intravenous Given 07/07/18 1317)  sodium chloride 0.9 % bolus 1,000 mL (1,000 mLs Intravenous New Bag/Given 07/07/18 1322)     Initial Impression / Assessment and Plan / ED Course  I have reviewed the triage vital signs and the nursing notes.  Pertinent labs & imaging results that were available during my care of the patient were reviewed by me and considered in my medical decision making (see chart for details).   Patient w/ hx of EtOH abuse & prior withdrawal presents to the ED w/ concern for EtOH withdrawal. Last drink was 3 days prior. Sxs include nausea, vomiting, abdominal pain, & tremors. His abdomen is notable for mild epigastric tenderness w/o peritoneal signs. Intermittent mild tremors noted. Oriented. Does not appear to be responding to internal stimuli. Initial CIWA 12- 1mg  of Ativan administered @ 11:40. Will place on  CIWA protocol w/ labs pending.   12:28: RE-EVAL: CIWA @ 10, will administer additional 1mg  of Ativan.   Labs reivewed: nonspecific leukocytosis, somewhat similar but less elevated w/ prior EtOH withdrawal on chart review, abdomen without peritoneal signs, no fever, doubt cholecystitis, appendicitis, diverticulitis, perf/obstruction. Suspect alcoholic ketoacidosis w/ gap of 23 & bicarb of 17, however UA is pending at this time. Plan for amp D50 w/ NS bolus then D5 1/2NS maintenance. Will consult for admission.   13:25: CONSULT: Discussed case with IM teaching service- accepts admission.   This is a shared visit with supervising physician Dr. Sedonia Small who has independently evaluated patient & provided guidance in evaluation/management/disposition, in agreement with care    Glennon Mac was evaluated in Emergency Department on 07/07/2018 for the symptoms described in the history of present illness. He/she was evaluated in the context of the global COVID-19 pandemic, which necessitated consideration that the patient might be at risk for infection with the SARS-CoV-2 virus that causes COVID-19. Institutional protocols and algorithms that pertain to the evaluation of patients at risk for COVID-19 are in a state of rapid change based on information released by regulatory bodies including the CDC and federal and state organizations. These policies and algorithms were followed during the patient's care in the ED.  Final Clinical Impressions(s) / ED Diagnoses   Final diagnoses:  Alcohol withdrawal syndrome without complication [facility])  Alcoholic ketoacidosis    ED Discharge Orders    None       Amaryllis Dyke, PA-C 07/07/18 1450    Maudie Flakes, MD 07/08/18 [phone]

## 2018-07-07 NOTE — H&P (Signed)
Date: 07/07/2018               Patient Name:  Mark Mcdaniel MRN: [id number]  DOB: [birth year] Age / Sex: 28 y.o., male   PCP: Enid Skeens., MD         Medical Service: Internal Medicine Teaching Service         Attending Physician: Dr. Lucious Groves, DO    First Contact: Dr. Sharon Seller Pager: [phone]  Second Contact: Dr. Frederico Hamman Pager: [phone]       After Hours (After 5p/  First Contact Pager: [phone]  weekends / holidays): Second Contact Pager: [phone]   Chief Complaint: Nausea and Vomiting  History of Present Illness:  Mark Mcdaniel is a 28yo male with PMH alcohol use, depression, suicidal ideation presenting with two days of nausea, vomiting, shaking, and abdominal pain. He states this started around two days ago when he drank more alcohol than usual, he is unsure how much but states this was more than 12 beers. He has been unable to eat or drink anything since then and is not sure when he last was able to take anything po. At first his vomit looked like food but is now clear. He denies blood in his vomit. He would like to try drinking ginger ale. He denies dysuria, changes in urination, diarrhea, changes in BM. He endorses chronic dry cough that is non-productive, denies sinus pain, recent travel, or recent ill contacts.  He states he normally drinks anywhere from a 40 to several beers in a day and drinks every day. He has had similar symptoms previously when trying to stop drinking or with drinking excessively. Sometimes  He will stay home and go through the symptoms but has presented to the hospital for this as well.  He states he is supposed to be on medications for depression but was lost to follow-up and ran out of his medications.   In the ED he received thiamine, 1mg  ativan IV for CIWA of 15 which reduced to 10, then received additional 1 mg ativan. He also received an Amp of D50, 2L NS and was started on D5 1/2 NS.  Social:  Lives at home with his grandparents Smokes  1ppd past 14 years Occasional marijuana use Denies other recreational drug use  Family History:  Family History  Problem Relation Age of Onset  . Cancer Maternal Grandmother   . Diabetes Maternal Grandmother   . Cancer Paternal Grandmother      Meds:  No outpatient medications have been marked as taking for the 07/07/18 encounter [facility] Encounter).     Allergies: Allergies as of 07/07/2018 - Review Complete 07/07/2018  Allergen Reaction Noted  . Tape Other (See Comments) 11/16/2016  . Tea Nausea And Vomiting 03/04/2017   Past Medical History:  Diagnosis Date  . ADHD (attention deficit hyperactivity disorder)   . Alcohol abuse   . Anxiety   . Depression      Review of Systems: A complete ROS was negative except as per HPI.   Physical Exam: Blood pressure 120/80, pulse 86, temperature 98.2 F (36.8 C), temperature source Oral, resp. rate 18, height 6\' 4"  (1.93 m), weight 65.8 kg, SpO2 100 %.  Constitution: mild distress, acutely ill appearing, supine in bed HEENT: [place]/AT, no scleral icterus  Cardio: tachycardic, RRR  Respiratory: CTA, no w/r/r  Abdominal: +BS, non-distended, epigastric tenderness, soft MSK: no edema, moving all extremities, no asterixis  Neuro: a&o, normal affect Skin: c/d/i, no jaundice  EKG: personally reviewed my interpretation is right axis deviation, borderline QT prolongation, left atrial enlargement   Assessment & Plan by Problem: Active Problems:   Alcoholic ketoacidosis   Alcohol withdrawal syndrome without complication (HCC)  Mark Mcdaniel is a 28yo male with PMH chronic alcohol use, depression, suicidal ideation presenting with two days of nausea, vomiting, shaking, and abdominal pain after drinking more than 12 beers in one sitting.   Alcoholic Ketoacidosis Anion gapped metabolic acidosis likely 2/2 alcoholic ketoacidosis on clinical presentation and history with recent binge drinking episode followed by abdominal pain, nausea, and  vomiting. Lipase, glucose within normal limits. EKG showed prolonged QTc with possible biatrial enlargement although likely normal for him as he is thin and this appears on previous EKGs.   - f/u serum osms  - beta hydroxybuterate - lactic acid - urinalysis, UDS - cont D5 1/2 NS 125cc/hr - CLD - am BMP - zofran prn q6h - monitor QTc - mildly elevated  - am EKG  Alcohol Use Symptoms appear consistent with alcoholic ketoacidosis. Endorses history of withdrawal as he drinks anywhere from a 40 oz to several beers per day with episodes of binge drinking. He did receive 2mg  ativan in the ED.   - CIWA without ativan  - thiamine, folic acid   Leukocytosis Vitals wnl except tachycardia. No clinical indication of infectious etiology. Possibly inflammatory 2/2 acidosis.   - am CBC   Diet: CLD VTE: lovenox IVF: D5 1/2 NS, received 2L NS in ED Code: full   Dispo: Admit patient to Observation with expected length of stay less than 2 midnights.  SignedMarty Heck, DO 07/07/2018, 4:27 PM  Pager: [phone]

## 2018-07-07 NOTE — ED Notes (Signed)
ED TO INPATIENT HANDOFF REPORT  ED Nurse Name and Phone #: Aydee Mcnew [phone]  S Name/Age/Gender Mark Mcdaniel 28 y.o. male Room/Bed: 038C/038C  Code Status   Code Status: Prior  Home/SNF/Other Home Patient oriented to: self, place, time and situation Is this baseline? Yes   Triage Complete: Triage complete  Chief Complaint ETOH withdrawal  Triage Note Pt  Is currently going through alcohol withdraw. Pt states he is trying to stop drinking. Pt started drinking a fifth a day since 23.    Allergies Allergies  Allergen Reactions  . Tape Other (See Comments)    Medical tapes irritates the patient's skin  . Tea Nausea And Vomiting    Level of Care/Admitting Diagnosis ED Disposition    ED Disposition Condition Comment   Admit  Hospital Area: [facility] [[id number]]  Level of Care: Telemetry Medical [104]  Covid Evaluation: N/A  Diagnosis: Alcoholic ketoacidosis [734193]  Admitting Physician: Bosie [place]per  Attending Physician: HOFFMAN, ERIK C [[id number]  PT Class (Do Not Modify): Observation [104]  PT Acc Code (Do Not Modify): Observation [10022]       B Medical/Surgery History Past Medical History:  Diagnosis Date  . ADHD (attention deficit hyperactivity disorder)   . Alcohol abuse   . Anxiety   . Depression    Past Surgical History:  Procedure Laterality Date  . HERNIA REPAIR     Performed when 35 months old     A IV Location/Drains/Wounds Patient Lines/Drains/Airways Status   Active Line/Drains/Airways    Name:   Placement date:   Placement time:   Site:   Days:   Peripheral IV 07/07/18 Right Forearm   07/07/18    1132    Forearm   less than 1          Intake/Output Last 24 hours No intake or output data in the 24 hours ending 07/07/18 1425  Labs/Imaging Results for orders placed or performed during the hospital encounter of 07/07/18 (from the past 48 hour(s))  CBC     Status: Abnormal   Collection Time: 07/07/18 11:44  AM  Result Value Ref Range   WBC 26.3 (H) 4.0 - 10.5 K/uL   RBC 5.23 4.22 - 5.81 MIL/uL   Hemoglobin 16.7 13.0 - 17.0 g/dL   HCT 49.2 39.0 - 52.0 %   MCV 94.1 80.0 - 100.0 fL   MCH 31.9 26.0 - 34.0 pg   MCHC 33.9 30.0 - 36.0 g/dL   RDW 13.3 11.5 - 15.5 %   Platelets 316 150 - 400 K/uL   nRBC 0.0 0.0 - 0.2 %    Comment: Performed at [facility], [place] [address] [place], [place] [address]  Comprehensive metabolic panel     Status: Abnormal   Collection Time: 07/07/18 11:44 AM  Result Value Ref Range   Sodium 139 135 - 145 mmol/L   Potassium 4.3 3.5 - 5.1 mmol/L   Chloride 99 98 - 111 mmol/L   CO2 17 (L) 22 - 32 mmol/L   Glucose, Bld 86 70 - 99 mg/dL   BUN 9 6 - 20 mg/dL   Creatinine, Ser 1.19 0.61 - 1.24 mg/dL   Calcium 9.8 8.9 - 10.3 mg/dL   Total Protein 7.6 6.5 - 8.1 g/dL   Albumin 4.5 3.5 - 5.0 g/dL   AST 28 15 - 41 U/L   ALT 21 0 - 44 U/L   Alkaline Phosphatase 78 38 - 126 U/L   Total  Bilirubin 1.6 (H) 0.3 - 1.2 mg/dL   GFR calc non Af Amer >60 >60 mL/min   GFR calc Af Amer >60 >60 mL/min   Anion gap 23 (H) 5 - 15    Comment: Performed at [facility] [address], [place], [place] [address]  Lipase, blood     Status: None   Collection Time: 07/07/18 11:44 AM  Result Value Ref Range   Lipase 25 11 - 51 U/L    Comment: Performed at [facility] [address], [place], [place] [address]  Ethanol     Status: None   Collection Time: 07/07/18 12:26 PM  Result Value Ref Range   Alcohol, Ethyl (B) <10 <10 mg/dL    Comment: (NOTE) Lowest detectable limit for serum alcohol is 10 mg/dL. For medical purposes only. Performed at [facility], [place] [address], [place], [place] [address]    No results found.  Pending Labs Unresulted Labs (From admission, onward)    Start     Ordered   07/07/18 1400  Phosphorus  Add-on,   R     07/07/18 1359   07/07/18 1334  SARS Coronavirus 2 (CEPHEID - Performed in [facility] hospital lab), Hosp Order   (Asymptomatic Patients Labs)  Once,   R    Question:  Rule Out  Answer:  Yes   07/07/18 1333   07/07/18 1332  Urine rapid drug screen (hosp performed)  ONCE - STAT,   R     07/07/18 1331   07/07/18 1331  Magnesium  Add-on,   R     07/07/18 1330   07/07/18 1245  Urinalysis, Routine w reflex microscopic  Once,   STAT     07/07/18 1245   Signed and Held  HIV antibody (Routine Testing)  Add-on,   R     Signed and Held   Signed and Held  Basic metabolic panel  Tomorrow morning,   R     Signed and Held   Signed and Held  CBC  Tomorrow morning,   R     Signed and Held          Vitals/Pain Today's Vitals   07/07/18 1245 07/07/18 1300 07/07/18 1345 07/07/18 1400  BP: 118/82 111/71 119/64 118/75  Pulse: 85 80 88 88  Resp: 19 19 19 19   Temp:      TempSrc:      SpO2: 97% 96% 95% 96%  Weight:      Height:      PainSc:        Isolation Precautions No active isolations  Medications Medications  thiamine (VITAMIN B-1) tablet 100 mg ( Oral See Alternative 07/07/18 1230)    Or  thiamine (B-1) injection 100 mg (100 mg Intravenous Given 07/07/18 1230)  dextrose 5 %-0.9 % sodium chloride infusion ( Intravenous New Bag/Given 07/07/18 1321)  sodium chloride 0.9 % bolus 1,000 mL (1,000 mLs Intravenous New Bag/Given 07/07/18 1142)  LORazepam (ATIVAN) injection 1 mg (1 mg Intravenous Given 07/07/18 1140)  famotidine (PEPCID) IVPB 20 mg premix (0 mg Intravenous Stopped 07/07/18 1227)  dextrose 50 % solution 50 mL (50 mLs Intravenous Given 07/07/18 1317)  sodium chloride 0.9 % bolus 1,000 mL (1,000 mLs Intravenous New Bag/Given 07/07/18 1322)    Mobility walks Low fall risk   Focused Assessments    R Recommendations: See Admitting Provider Note  Report given to:   Additional Notes:

## 2018-07-07 NOTE — ED Triage Notes (Signed)
Pt  Is currently going through alcohol withdraw. Pt states he is trying to stop drinking. Pt started drinking a fifth a day since 23.

## 2018-07-08 DIAGNOSIS — F1020 Alcohol dependence, uncomplicated: Secondary | ICD-10-CM | POA: Diagnosis present

## 2018-07-08 LAB — RAPID URINE DRUG SCREEN, HOSP PERFORMED
Amphetamines: NOT DETECTED
Barbiturates: NOT DETECTED
Benzodiazepines: POSITIVE — AB
Cocaine: POSITIVE — AB
Opiates: NOT DETECTED
Tetrahydrocannabinol: POSITIVE — AB

## 2018-07-08 LAB — BASIC METABOLIC PANEL
Anion gap: 11 (ref 5–15)
BUN: 5 mg/dL — ABNORMAL LOW (ref 6–20)
CO2: 23 mmol/L (ref 22–32)
Calcium: 8.7 mg/dL — ABNORMAL LOW (ref 8.9–10.3)
Chloride: 104 mmol/L (ref 98–111)
Creatinine, Ser: 0.84 mg/dL (ref 0.61–1.24)
GFR calc Af Amer: >60 mL/min (ref >60)
GFR calc non Af Amer: >60 mL/min (ref >60)
Glucose, Bld: 82 mg/dL (ref 70–99)
Potassium: 3.6 mmol/L (ref 3.5–5.1)
Sodium: 138 mmol/L (ref 135–145)

## 2018-07-08 LAB — CBC
HCT: 45.1 % (ref 39.0–52.0)
Hemoglobin: 15.6 g/dL (ref 13.0–17.0)
MCH: 32 pg (ref 26.0–34.0)
MCHC: 34.6 g/dL (ref 30.0–36.0)
MCV: 92.6 fL (ref 80.0–100.0)
Platelets: 217 10*3/uL (ref 150–400)
RBC: 4.87 MIL/uL (ref 4.22–5.81)
RDW: 13.2 % (ref 11.5–15.5)
WBC: 12.2 10*3/uL — ABNORMAL HIGH (ref 4.0–10.5)
nRBC: 0 % (ref 0.0–0.2)

## 2018-07-08 LAB — URINALYSIS, ROUTINE W REFLEX MICROSCOPIC
Bilirubin Urine: NEGATIVE
Glucose, UA: NEGATIVE mg/dL
Hgb urine dipstick: NEGATIVE
Ketones, ur: 20 mg/dL — AB
Leukocytes,Ua: NEGATIVE
Nitrite: NEGATIVE
Protein, ur: NEGATIVE mg/dL
Specific Gravity, Urine: 1.015 (ref 1.005–1.030)
pH: 6 (ref 5.0–8.0)

## 2018-07-08 LAB — HIV ANTIBODY (ROUTINE TESTING W REFLEX): HIV Screen 4th Generation wRfx: NONREACTIVE

## 2018-07-08 MED ORDER — NALTREXONE HCL 50 MG PO TABS: 50.0000 mg | ORAL_TABLET | Freq: Every day | ORAL | 2 refills | Status: DC

## 2018-07-08 NOTE — Progress Notes (Signed)
Pt given discharge summary and discharged home via grandmother as transportation.

## 2018-07-08 NOTE — Progress Notes (Signed)
   Subjective:  He is feeling much better this morning. Nausea and vomiting have resolved and he was able to drink ginger ale yesterday. He denies abdominal pain. Discussed recent medications for depression which he has not taken since December as he felt the prozac was not helping. He is interested in medication to help him stop drinking.   Objective:  Vital signs in last 24 hours: Vitals:   07/07/18 1500 07/07/18 2012 07/08/18 0007 07/08/18 0429  BP: 120/80 119/64 121/78 115/82  Pulse: 86 67 66 66  Resp: 18 16 18 16   Temp: 98.2 F (36.8 C) 98.5 F (36.9 C) 98.7 F (37.1 C) 98.1 F (36.7 C)  TempSrc: Oral Oral Oral Oral  SpO2: 100% 100% 99% 98%  Weight:      Height:       Constitution: NAD, supine in bed Eyes: no icterus or injection  Cardio: RRR, no m/r/g  Respiratory: CTA, no wheezing rales or rhonchi  Abdominal: +BS, soft, non-distended  MSK: no LE edema, moving all extremities   Assessment/Plan:  Active Problems:   Alcoholic ketoacidosis   Alcohol withdrawal syndrome without complication (HCC)  Mark Mcdaniel is a 28yo male with PMH chronic alcohol use, depression, suicidal ideation presenting with two days of nausea, vomiting, shaking, and abdominal pain after drinking more than 12 beers in one sitting.    Alcoholic Ketoacidosis Symptoms and labs greatly improved this morning after fluid resuscitation. Anion gap and acidosis have both resolved. He feels he can tolerate a regular diet. He is interested in starting medication to help him quit drinking.   - regular diet - once he tolerates food po he will be safe for discharge  - start naltrexone at discharge - he does not take opioids  Alcohol Use Disorder Kept on CIWA overnight. Last drink was 4 days ago. No signs of withdrawal and CIWA last night 3.   Depression He has not been taking his prozac as this was not helping. He previously went to [facility] for his depression. No SI. Discussed that there are other  medications he can still try and that anti-depressants often take up two 4-8 weeks to take effect and recommended he f/u with [facility] for further treatment.   VTE: lovenox IVF: d5 1/2 NS 125 cc/hr Diet: CLD Code: full  Dispo: Anticipated discharge in approximately today or tomorrow.   Marty Heck, DO 07/08/2018, 6:27 AM Pager: [phone]

## 2018-07-08 NOTE — Discharge Instructions (Signed)
Alcohol Use Disorder °Alcohol use disorder is when your drinking disrupts your daily life. When you have this condition, you drink too much alcohol and you cannot control your drinking. °Alcohol use disorder can cause serious problems with your physical health. It can affect your brain, heart, liver, pancreas, immune system, stomach, and intestines. Alcohol use disorder can increase your risk for certain cancers and cause problems with your mental health, such as depression, anxiety, psychosis, delirium, and dementia. People with this disorder risk hurting themselves and others. °What are the causes? °This condition is caused by drinking too much alcohol over time. It is not caused by drinking too much alcohol only one or two times. Some people with this condition drink alcohol to cope with or escape from negative life events. Others drink to relieve pain or symptoms of mental illness. °What increases the risk? °You are more likely to develop this condition if: °· You have a family history of alcohol use disorder. °· Your culture encourages drinking to the point of intoxication, or makes alcohol easy to get. °· You had a mood or conduct disorder in childhood. °· You have been a victim of abuse. °· You are an adolescent and: °? You have poor grades or difficulties in school. °? Your caregivers do not talk to you about saying no to alcohol, or supervise your activities. °? You are impulsive or you have trouble with self-control. °What are the signs or symptoms? °Symptoms of this condition include: °· Drinking more than you want to. °· Drinking for longer than you want to. °· Trying several times to drink less or to control your drinking. °· Spending a lot of time getting alcohol, drinking, or recovering from drinking. °· Craving alcohol. °· Having problems at work, at school, or at home due to drinking. °· Having problems in relationships due to drinking. °· Drinking when it is dangerous to drink, such as before  driving a car. °· Continuing to drink even though you know you might have a physical or mental problem related to drinking. °· Needing more and more alcohol to get the same effect you want from the alcohol (building up tolerance). °· Having symptoms of withdrawal when you stop drinking. Symptoms of withdrawal include: °? Fatigue. °? Nightmares. °? Trouble sleeping. °? Depression. °? Anxiety. °? Fever. °? Seizures. °? Severe confusion. °? Feeling or seeing things that are not there (hallucinations). °? Tremors. °? Rapid heart rate. °? Rapid breathing. °? High blood pressure. °· Drinking to avoid symptoms of withdrawal. °How is this diagnosed? °This condition is diagnosed with an assessment. Your health care provider may start the assessment by asking three or four questions about your drinking. °Your health care provider may perform a physical exam or do lab tests to see if you have physical problems resulting from alcohol use. She or he may refer you to a mental health professional for evaluation. °How is this treated? °Some people with alcohol use disorder are able to reduce their alcohol use to low-risk levels. Others need to completely quit drinking alcohol. When necessary, mental health professionals with specialized training in substance use treatment can help. Your health care provider can help you decide how severe your alcohol use disorder is and what type of treatment you need. The following forms of treatment are available: °· Detoxification. Detoxification involves quitting drinking and using prescription medicines within the first week to help lessen withdrawal symptoms. This treatment is important for people who have had withdrawal symptoms before and for heavy drinkers   who are likely to have withdrawal symptoms. Alcohol withdrawal can be dangerous, and in severe cases, it can cause death. Detoxification may be provided in a home, community, or primary care setting, or in a hospital or substance use  treatment facility. °· Counseling. This treatment is also called talk therapy. It is provided by substance use treatment counselors. A counselor can address the reasons you use alcohol and suggest ways to keep you from drinking again or to prevent problem drinking. The goals of talk therapy are to: °? Find healthy activities and ways for you to cope with stress. °? Identify and avoid the things that trigger your alcohol use. °? Help you learn how to handle cravings. °· Medicines. Medicines can help treat alcohol use disorder by: °? Decreasing alcohol cravings. °? Decreasing the positive feeling you have when you drink alcohol. °? Causing an uncomfortable physical reaction when you drink alcohol (aversion therapy). °· Support groups. Support groups are led by people who have quit drinking. They provide emotional support, advice, and guidance. °These forms of treatment are often combined. Some people with this condition benefit from a combination of treatments provided by specialized substance use treatment centers. °Follow these instructions at home: °· Take over-the-counter and prescription medicines only as told by your health care provider. °· Check with your health care provider before starting any new medicines. °· Ask friends and family members not to offer you alcohol. °· Avoid situations where alcohol is served, including gatherings where others are drinking alcohol. °· Create a plan for what to do when you are tempted to use alcohol. °· Find hobbies or activities that you enjoy that do not include alcohol. °· Keep all follow-up visits as told by your health care provider. This is important. °How is this prevented? °· If you drink, limit alcohol intake to no more than 1 drink a day for nonpregnant women and 2 drinks a day for men. One drink equals 12 oz of beer, 5 oz of wine, or 1½ oz of hard liquor. °· If you have a mental health condition, get treatment and support. °· Do not give alcohol to  adolescents. °· If you are an adolescent: °? Do not drink alcohol. °? Do not be afraid to say no if someone offers you alcohol. Speak up about why you do not want to drink. You can be a positive role model for your friends and set a good example for those around you by not drinking alcohol. °? If your friends drink, spend time with others who do not drink alcohol. Make new friends who do not use alcohol. °? Find healthy ways to manage stress and emotions, such as meditation or deep breathing, exercise, spending time in nature, listening to music, or talking with a trusted friend or family member. °Contact a health care provider if: °· You are not able to take your medicines as told. °· Your symptoms get worse. °· You return to drinking alcohol (relapse) and your symptoms get worse. °Get help right away if: °· You have thoughts about hurting yourself or others. °If you ever feel like you may hurt yourself or others, or have thoughts about taking your own life, get help right away. You can go to your nearest emergency department or call: °· Your local emergency services (911 in the [place] °· A suicide crisis helpline, such as the National Suicide Prevention Lifeline at 1-800-273-8255. This is open 24 hours a day. °Summary °· Alcohol use disorder is when your drinking disrupts your daily   about hurting yourself or others.  If you ever feel like you may hurt yourself or others, or have thoughts about taking your own life, get help right away. You can go to your nearest emergency department or call:  · Your local emergency services (911 in the [place]  · A suicide crisis helpline, such as the National Suicide Prevention Lifeline at 1-800-273-8255. This is open 24 hours a day.  Summary  · Alcohol use disorder is when your drinking disrupts your daily life. When you have this condition, you drink too much alcohol and you cannot control your drinking.  · Treatment may include detoxification, counseling, medicine, and support groups.  · Ask friends and family members not to offer you alcohol. Avoid situations where alcohol is served.  · Get help right away if you have thoughts about hurting yourself or others.  This information is not intended to replace advice given to you by your health care provider. Make sure you discuss any questions you have with your health care provider.  Document Released: 03/25/2004 Document Revised: 11/13/2015 Document Reviewed: 11/13/2015  Elsevier  Interactive Patient Education © 2019 [employer] Inc[employer]

## 2018-07-08 NOTE — Discharge Summary (Signed)
Name: Mark Mcdaniel MRN: [id number] DOB: [birth year] 28 y.o. PCP: Enid Skeens., MD  Date of Admission: 07/07/2018 11:15 AM Date of Discharge: 07/08/2018 Attending Physician: Lucious Groves  Discharge Diagnosis: 1. Alcoholic Ketoacidosis 2. Alcohol Use Disorder 3. Depressive Disorder 4. Leukocytosis   Discharge Medications: Allergies as of 07/08/2018      Reactions   Tape Other (See Comments)   Medical tapes irritates the patient's skin   Tea Nausea And Vomiting      Medication List    TAKE these medications   FLUoxetine 10 MG capsule Commonly known as:  PROZAC Take 1 capsule (10 mg total) by mouth daily. For depression   gabapentin 100 MG capsule Commonly known as:  NEURONTIN Take 2 capsules (200 mg total) by mouth 2 (two) times daily. For agitation   hydrOXYzine 25 MG tablet Commonly known as:  ATARAX/VISTARIL Take 1 tablet (25 mg) by mouth every 6 hours as needed: For anxiety.   naltrexone 50 MG tablet Commonly known as:  DEPADE Take 1 tablet (50 mg total) by mouth daily.   nicotine 21 mg/24hr patch Commonly known as:  NICODERM CQ - dosed in mg/24 hours Place 1 patch (21 mg total) onto the skin daily. (May buy from over the counter): For smoking cessation   traZODone 50 MG tablet Commonly known as:  DESYREL Take 1 tablet (50 mg total) by mouth at bedtime as needed for sleep.       Disposition and follow-up:   [name] was discharged from [facility] in Good condition.  At the hospital follow up visit please address:  1.  Alcoholic Ketoacidosis: presented with anion gapped metabolic acidosis with history consistent with alcoholic ketoacidosis. Subsequent urinalysis, osms and and other labs were drawn after he had received 2.5L fluids. . Symptoms resolved with fluid resuscitation and he was able to tolerate a normal diet the morning following admission.  Alcohol Use Disorder: History of withdrawal like symptoms when he tries to  quit. Endorses marijuana use. Placed on CIWA, did not required ativan. He is eager to stop drinking and was started on Naltrexone 50 mgqd at [place]. Educated to not take opioid medications with this. UDS after discharge positive for cocaine and benzos. Depressive Disorder: Previously on prozac for two months, stopped taking it around December as he felt it was not working. Recommended follow-up with [facility] to explore alternative options for treatment.  Leukocytosis: WBC count 26 at admission. Vitals, labs, and symptoms not significant for infectious etiology. Decreased to 12 day after admission and is thought to likely be secondary to stress and dehydration. Recommend CBC with differential at follow-up.  2.  Labs / imaging needed at time of follow-up: CBC with diff  3.  Pending labs/ test needing follow-up: none  Follow-up Appointments:   Hospital Course by problem list: 1. Alcoholic Ketoacidosis 4. Leukocytosis Mark Mcdaniel is a 28yo male with PMH chronic alcohol use, depression, suicidal ideation who presented with two days of nausea, vomiting, shaking, and abdominal pain after drinking more than 12 beers in one sitting. He was found to have anion gapped metabolic acidosis, leukocytosis and was admitted for alcoholic ketoacidosis with concern for alcohol withdrawal. Labs and UA were ordered to confirm diagnosis but were drawn after receiving 2.5L of fluid, which likely skewed results. UDS was positive for cocaine, marijuana, and benzodiazepines. He was fluid resuscitated with D5 1/2 NS and treated symptomatically for his nausea and vomiting. His metabolic acidosis resolved overnight with  fluids, and once he tolerated a regular diet he was stable for discharge.  For his leukocytosis he did not appear to have symptoms or other labs consistent with infectious etiolgoy, and his only significant vital was elevated heart rate. Leukocytosis improved after fluid resuscitation to 12 and is thought to be  secondary to dehydration and stress.   2. Alcohol Use Disorder Received 2mg  ativan in the ED. On admission was placed on CIWA and did not require ativan. Per patient he does have a history of withdrawal symptoms when he tries to quit drinking but is usually able to tolerate this on his own at home. He would like to quit drinking and was started on Naltrexone 50 mg qd. He was educated on not taking opioids with this medication.   3. Depressive Disorder History of depression and SI previously treated with prozac. No suicidal ideation at admission. He is no longer taking any of his medications for depression. He had stopped prozac in December after trying this for two months and feeling that it did not help. He was encouraged to follow-up with [facility] to explore other treatment options for his depression.    Discharge Vitals:   BP 115/80 (BP Location: Right Arm)   Pulse 68   Temp 97.9 F (36.6 C) (Oral)   Resp 14   Ht 6\' 4"  (1.93 m)   Wt 65.8 kg   SpO2 98%   BMI 17.65 kg/m   Pertinent Labs, Studies, and Procedures:     Discharge Instructions: Discharge Instructions    Diet - low sodium heart healthy   Complete by:  As directed    Discharge instructions   Complete by:  As directed    You were hospitalized for Excessive Alcohol Consumption causing Acidosis. Thank you for allowing [place] to be part of your care.   Please follow-up with your primary care physician in the next week.  Please note these changes made to your medications:   Please START taking Naltraxone (DEPADE) 50 mg tablet - please take one tablet daily.   Increase activity slowly   Complete by:  As directed       Signed: Marty Heck, DO 07/10/2018, 6:11 AM   Pager: [phone]

## 2018-07-14 ENCOUNTER — Emergency Department (HOSPITAL_COMMUNITY): Payer: Self-pay

## 2018-07-14 ENCOUNTER — Encounter (HOSPITAL_COMMUNITY): Payer: Self-pay

## 2018-07-14 ENCOUNTER — Other Ambulatory Visit: Payer: Self-pay

## 2018-07-14 ENCOUNTER — Emergency Department (HOSPITAL_COMMUNITY)
Admission: EM | Admit: 2018-07-14 | Discharge: 2018-07-14 | Disposition: A | Discharge status: Home / Self Care | Payer: Self-pay | Attending: Emergency Medicine | Admitting: Emergency Medicine

## 2018-07-14 DIAGNOSIS — S42021A Displaced fracture of shaft of right clavicle, initial encounter for closed fracture: Secondary | ICD-10-CM | POA: Insufficient documentation

## 2018-07-14 DIAGNOSIS — Y939 Activity, unspecified: Secondary | ICD-10-CM | POA: Insufficient documentation

## 2018-07-14 DIAGNOSIS — W010XXA Fall on same level from slipping, tripping and stumbling without subsequent striking against object, initial encounter: Secondary | ICD-10-CM | POA: Insufficient documentation

## 2018-07-14 DIAGNOSIS — F17210 Nicotine dependence, cigarettes, uncomplicated: Secondary | ICD-10-CM | POA: Insufficient documentation

## 2018-07-14 DIAGNOSIS — Y999 Unspecified external cause status: Secondary | ICD-10-CM | POA: Insufficient documentation

## 2018-07-14 DIAGNOSIS — Y929 Unspecified place or not applicable: Secondary | ICD-10-CM | POA: Insufficient documentation

## 2018-07-14 MED ORDER — NAPROXEN 500 MG PO TABS: 500.0000 mg | ORAL_TABLET | Freq: Two times a day (BID) | ORAL | 0 refills | Status: DC

## 2018-07-14 MED ORDER — KETOROLAC TROMETHAMINE 15 MG/ML IJ SOLN
15.0000 mg | Freq: Once | INTRAMUSCULAR | Status: AC
  Administered 2018-07-14: 15 mg via INTRAMUSCULAR
  Filled 2018-07-14: qty 1

## 2018-07-14 NOTE — ED Notes (Signed)
Patient transported to x-ray. ?

## 2018-07-14 NOTE — ED Triage Notes (Signed)
Pt reports falling and landing on his right shoulder. Denies drinking any alcohol tonight but this RN can smell alcohol on him. Reports hitting his head but no LOC. Pt alert. nad noted.

## 2018-07-14 NOTE — ED Provider Notes (Signed)
[facility] EMERGENCY DEPARTMENT Provider Note   CSN: [id number] Arrival date & time: 07/14/18  2129    History   Chief Complaint Chief Complaint  Patient presents with  . Fall  . Shoulder Pain    HPI Mark Mcdaniel is a 28 y.o. male.     Patient presents the emergency department today with complaint of right shoulder and clavicle pain.  Patient states that he tripped over a pipe in the ground just prior to arrival.  He landed on his right shoulder.  He hit his head but did not lose consciousness.  He has had no confusion, vomiting, vision change.  He has been walking without any difficulty.  He denies any chest or abdominal injury.  He denies any numbness or tingling in his right hand or arm.  He has difficulty raising his right arm due to pain in the shoulder area.  He thinks that he broke his clavicle.  Patient states that his last drink of alcohol was 3 days ago.     Past Medical History:  Diagnosis Date  . ADHD (attention deficit hyperactivity disorder)   . Alcohol abuse   . Anxiety   . Depression     Patient Active Problem List   Diagnosis Date Noted  . Alcohol use disorder, moderate, dependence ([place]) 07/08/2018  . Alcoholic ketoacidosis 37/16/9678  . Alcohol withdrawal syndrome without complication ([place])   . Alcohol abuse with alcohol-induced mood disorder ([place]) 09/18/2017  . Major depressive disorder, single episode, severe ([place]) 09/18/2017  . Substance induced mood disorder ([place]) 09/18/2017  . Suicidal thoughts     Past Surgical History:  Procedure Laterality Date  . HERNIA REPAIR     Performed when 24 months old        Home Medications    Prior to Admission medications   Medication Sig Start Date End Date Taking? Authorizing Provider  naltrexone (DEPADE) 50 MG tablet Take 1 tablet (50 mg total) by mouth daily. 07/08/18   Seawell, Jaimie A, DO  naproxen (NAPROSYN) 500 MG tablet Take 1 tablet (500 mg total) by mouth 2 (two) times  daily. 07/14/18   Carlisle Cater, PA-C    Family History Family History  Problem Relation Age of Onset  . Cancer Maternal Grandmother   . Diabetes Maternal Grandmother   . Cancer Paternal Grandmother     Social History Social History   Tobacco Use  . Smoking status: Current Every Day Smoker    Packs/day: 1.50    Years: 15.00    Pack years: 22.50    Types: Cigarettes  . Smokeless tobacco: Never Used  Substance Use Topics  . Alcohol use: Yes    Alcohol/week: 8.0 standard drinks    Types: 5 Shots of liquor, 3 Cans of beer per week    Comment: 2/5 daily, three beers daily  . Drug use: Yes    Frequency: 1.0 times per week    Types: Marijuana    Comment: stated he quit THC one month ago     Allergies   Tape and Tea   Review of Systems Review of Systems  Gastrointestinal: Negative for nausea and vomiting.  Musculoskeletal: Positive for arthralgias, joint swelling and myalgias. Negative for back pain, gait problem and neck pain.  Skin: Negative for wound.  Neurological: Negative for weakness and numbness.  Psychiatric/Behavioral: Negative for confusion.     Physical Exam Updated Vital Signs BP (!) 144/110 (BP Location: Left Arm)   Pulse 87  Temp 98.4 F (36.9 C) (Oral)   Resp 20   SpO2 100%   Physical Exam Vitals signs and nursing note reviewed.  Constitutional:      Appearance: He is well-developed.  HENT:     Head: Normocephalic and atraumatic. No raccoon eyes or Battle's sign.     Right Ear: Tympanic membrane, ear canal and external ear normal. No hemotympanum.     Left Ear: Tympanic membrane, ear canal and external ear normal. No hemotympanum.     Nose: Nose normal.  Eyes:     General: Lids are normal.     Conjunctiva/sclera: Conjunctivae normal.     Pupils: Pupils are equal, round, and reactive to light.     Comments: No visible hyphema  Neck:     Musculoskeletal: Normal range of motion and neck supple.  Cardiovascular:     Rate and Rhythm: Normal  rate and regular rhythm.     Pulses: Normal pulses. No decreased pulses.          Radial pulses are 2+ on the right side and 2+ on the left side.  Pulmonary:     Effort: Pulmonary effort is normal.     Breath sounds: Normal breath sounds.  Abdominal:     Palpations: Abdomen is soft.     Tenderness: There is no abdominal tenderness.  Musculoskeletal: Normal range of motion.        General: Tenderness present.     Right shoulder: He exhibits tenderness. He exhibits normal range of motion and no bony tenderness.     Right elbow: Normal.    Right wrist: Normal.     Cervical back: He exhibits normal range of motion, no tenderness and no bony tenderness.     Thoracic back: He exhibits no tenderness and no bony tenderness.     Lumbar back: He exhibits no tenderness and no bony tenderness.     Right upper arm: He exhibits no tenderness, no bony tenderness and no swelling.       Arms:  Skin:    General: Skin is warm and dry.  Neurological:     Mental Status: He is alert and oriented to person, place, and time.     GCS: GCS eye subscore is 4. GCS verbal subscore is 5. GCS motor subscore is 6.     Cranial Nerves: No cranial nerve deficit.     Sensory: No sensory deficit.     Coordination: Coordination normal.     Deep Tendon Reflexes: Reflexes are normal and symmetric.     Comments: Motor, sensation, and vascular distal to the injury (right upper extremity) is fully intact. Normal grip. Normal sensation in hand and fingers.       ED Treatments / Results  Labs (all labs ordered are listed, but only abnormal results are displayed) Labs Reviewed - No data to display  EKG None  Radiology Dg Shoulder Right  Result Date: 07/14/2018 CLINICAL DATA:  Right shoulder injury in a fall tonight. Initial encounter. EXAM: RIGHT SHOULDER - 2+ VIEW COMPARISON:  None. FINDINGS: Acute fracture seen through the mid shaft of the right clavicle. No other acute bony or joint abnormality is identified.  Imaged right lung and ribs appear normal. IMPRESSION: Acute mid diaphyseal fracture right clavicle. Electronically Signed   By: Inge Rise M.D.   On: 07/14/2018 22:22    Procedures Procedures (including critical care time)  Medications Ordered in ED Medications  ketorolac (TORADOL) 15 MG/ML injection 15 mg (has no administration  in time range)     Initial Impression / Assessment and Plan / ED Course  I have reviewed the triage vital signs and the nursing notes.  Pertinent labs & imaging results that were available during my care of the patient were reviewed by me and considered in my medical decision making (see chart for details).        Patient seen and examined. Work-up initiated. Medications ordered.   Vital signs reviewed and are as follows: BP (!) 144/110 (BP Location: Left Arm)   Pulse 87   Temp 98.4 F (36.9 C) (Oral)   Resp 20   SpO2 100%   Patient with right midshaft clavicle fracture.  Shoulder is otherwise normal.  Patient does not have any pain or swelling over the sternoclavicular joint. Right upper extremity is neurovascularly intact with normal distal pulses and sensation.  Exam does not suggest any significant vascular injury or neurologic injury at this time.  Patient has already been placed in a splint.  He will continue rice protocol, NSAIDs.  He will be given IM Toradol before discharge in the emergency department.  Orthopedic referral given.   Final Clinical Impressions(s) / ED Diagnoses   Final diagnoses:  Closed displaced fracture of shaft of right clavicle, initial encounter   Patient with right clavicular fracture.  No significant vascular or neurological injury suspected.  Patient is not clinically intoxicated in the emergency department.  He has a normal neurological exam.  ED Discharge Orders         Ordered    naproxen (NAPROSYN) 500 MG tablet  2 times daily     07/14/18 2235           Carlisle Cater, Hershal Coria 07/14/18 2241     Quintella Reichert, MD 07/15/18 2314

## 2018-07-14 NOTE — Discharge Instructions (Signed)
Please read and follow all provided instructions.  Your diagnoses today include:  1. Closed displaced fracture of shaft of right clavicle, initial encounter     Tests performed today include:  An x-ray of the affected area -shows a fractured mid clavicle  Vital signs. See below for your results today.   Medications prescribed:   Naproxen - anti-inflammatory pain medication  Do not exceed 500mg  naproxen every 12 hours, take with food  You have been prescribed an anti-inflammatory medication or NSAID. Take with food. Take smallest effective dose for the shortest duration needed for your pain. Stop taking if you experience stomach pain or vomiting.   Take any prescribed medications only as directed.  Home care instructions:   Follow any educational materials contained in this packet  Follow R.I.C.E. Protocol:  R - rest your injury   I  - use ice on injury without applying directly to skin  C - compress injury with bandage or splint  E - elevate the injury as much as possible  Follow-up instructions: Please follow-up with your primary care provider or the provided orthopedic physician (bone specialist) in 1 week.  Return instructions:   Please return if your fingers are numb or tingling, appear gray or blue, or you have severe pain (also elevate the arm and loosen splint or wrap if you were given one)  Please return to the Emergency Department if you experience worsening symptoms.   Please return if you have any other emergent concerns.  Additional Information:  Your vital signs today were: BP (!) 144/110 (BP Location: Left Arm)    Pulse 87    Temp 98.4 F (36.9 C) (Oral)    Resp 20    SpO2 100%  If your blood pressure (BP) was elevated above 135/85 this visit, please have this repeated by your doctor within one month. --------------

## 2018-08-13 IMAGING — CT CT ABD-PELV W/ CM
2 of 4 series · 16 of 46 positions shown, 18 images · IV contrast (Omni 300)
Comparison: None.

CLINICAL DATA: Unspecified abdominal pain and vomiting. Alcohol
withdrawal.

EXAM:
CT ABDOMEN AND PELVIS WITH CONTRAST
TECHNIQUE: Multidetector CT imaging of the abdomen and pelvis was performed
using the standard protocol following bolus administration of
intravenous contrast.
CONTRAST:  100mL DWNUZ9-NMM IOPAMIDOL (DWNUZ9-NMM) INJECTION 61%

[Series 3: a/p w/ 5mm · axial · 0.69mm/px · z∈[+797,+1242]mm · 13 of 97 slices shown, 15 images]
[im 4/97  soft-tissue]
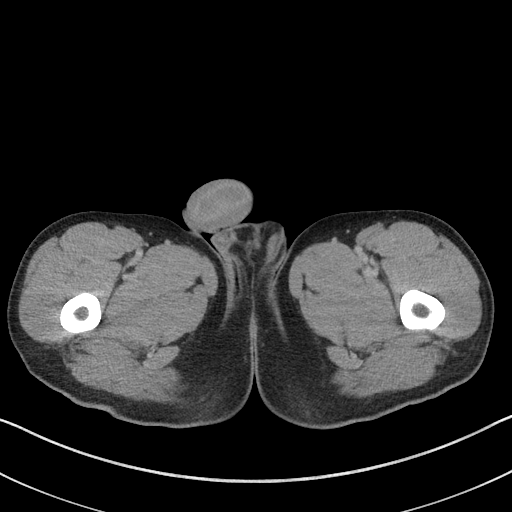
[im 4/97  bone]
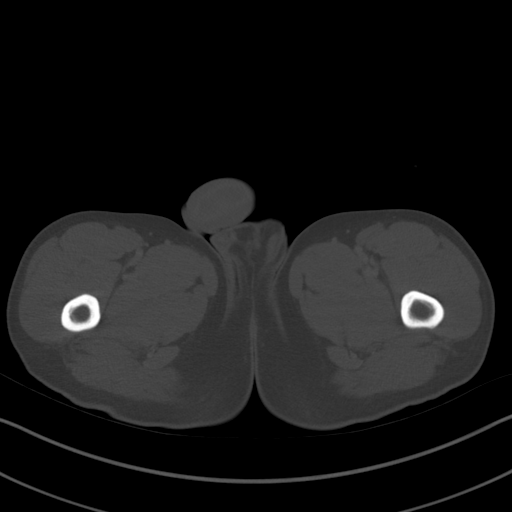
[im 12/97  soft-tissue]
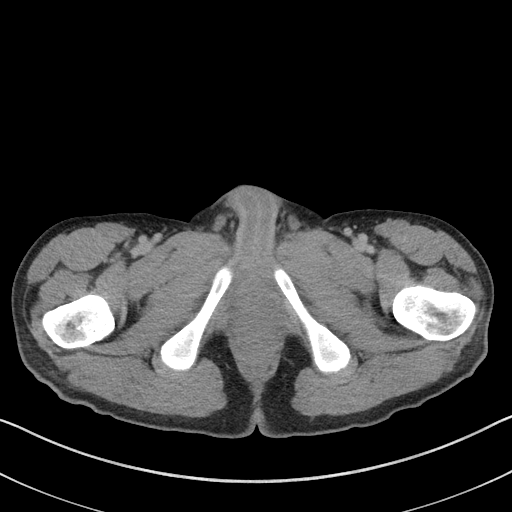
[im 20/97  soft-tissue]
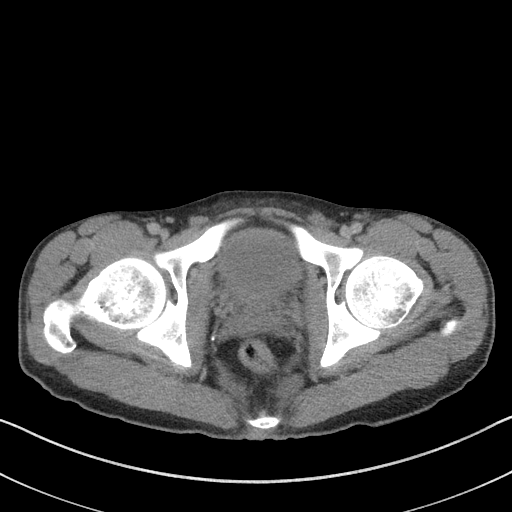
[im 27/97  soft-tissue]
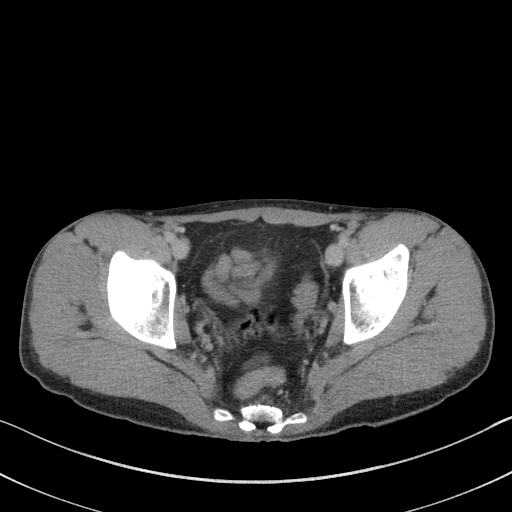
[im 35/97  soft-tissue]
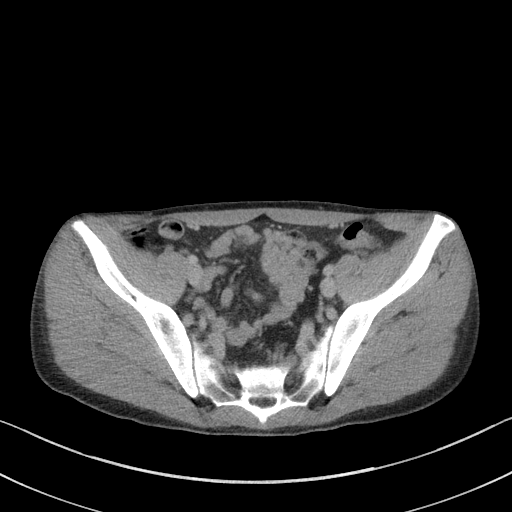
[im 43/97  soft-tissue]
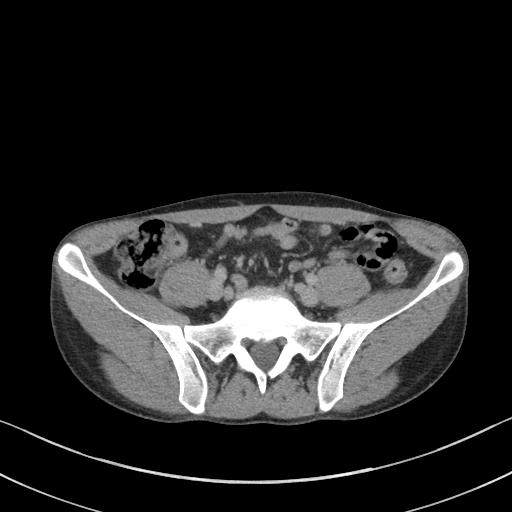
[im 50/97  soft-tissue]
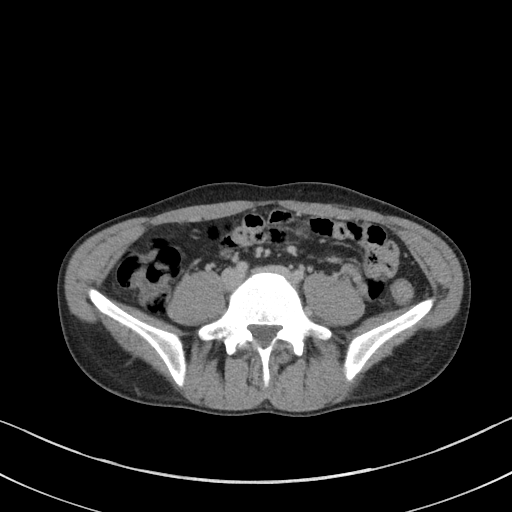
[im 54/97  soft-tissue]
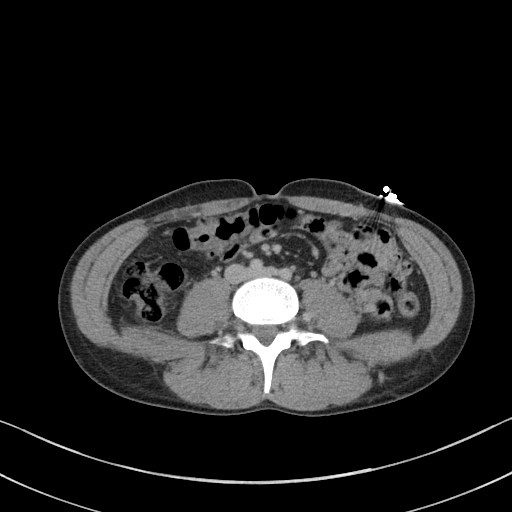
[im 62/97  soft-tissue]
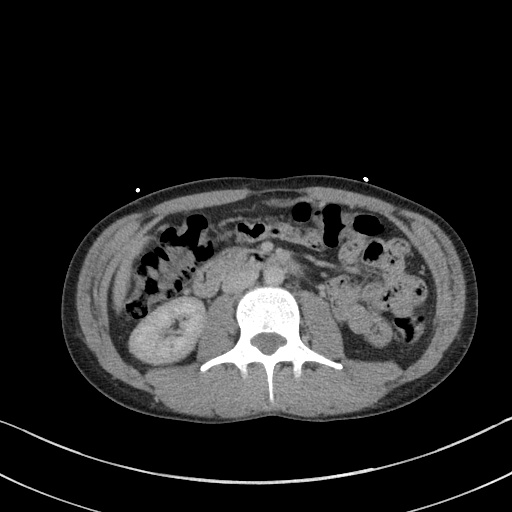
[im 62/97  bone]
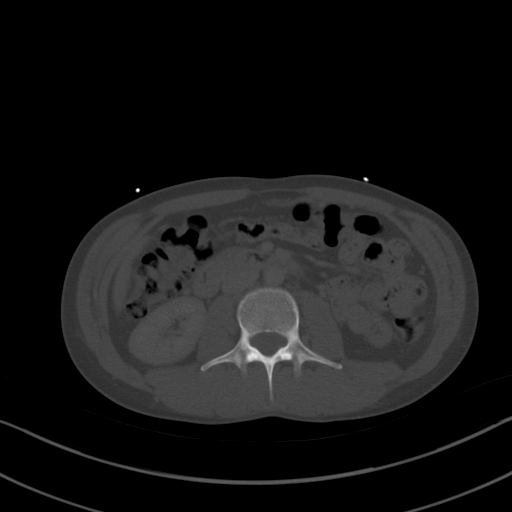
[im 70/97  soft-tissue]
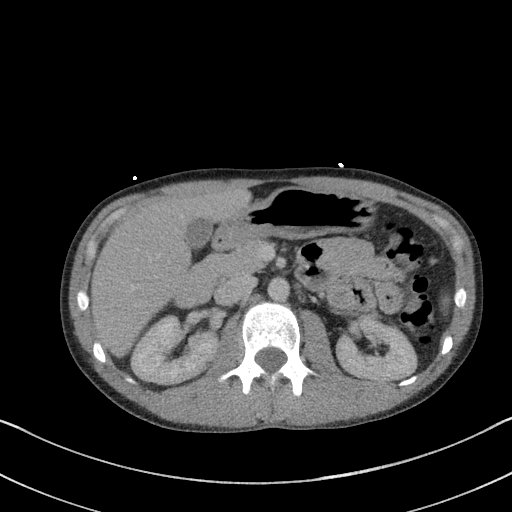
[im 77/97  soft-tissue]
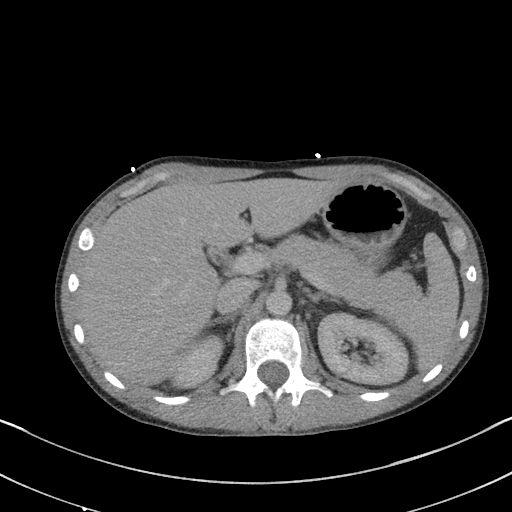
[im 85/97  soft-tissue]
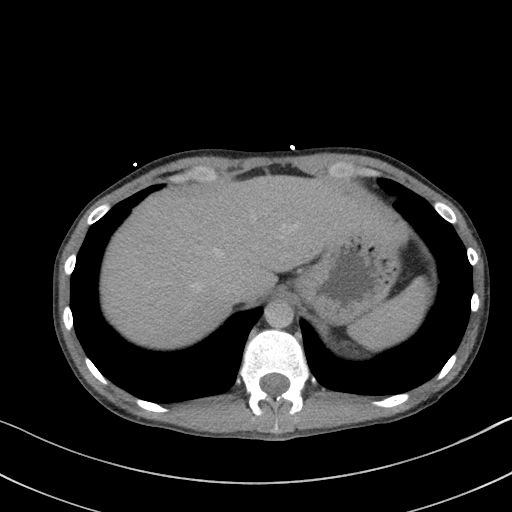
[im 93/97  soft-tissue]
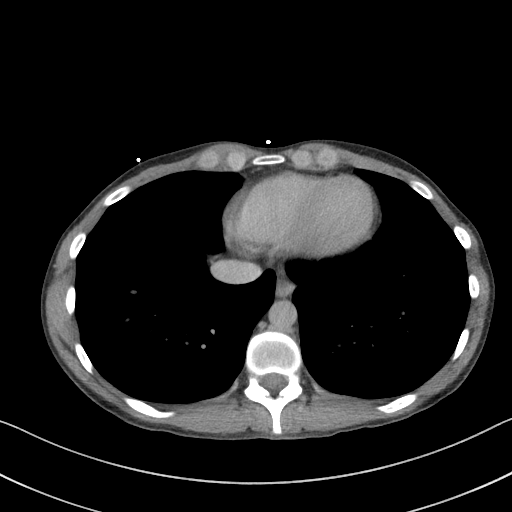

[Series 6: a/p w/ cor · coronal · 0.69mm/px · 3 of 117 slices shown]
[im 39/117  soft-tissue]
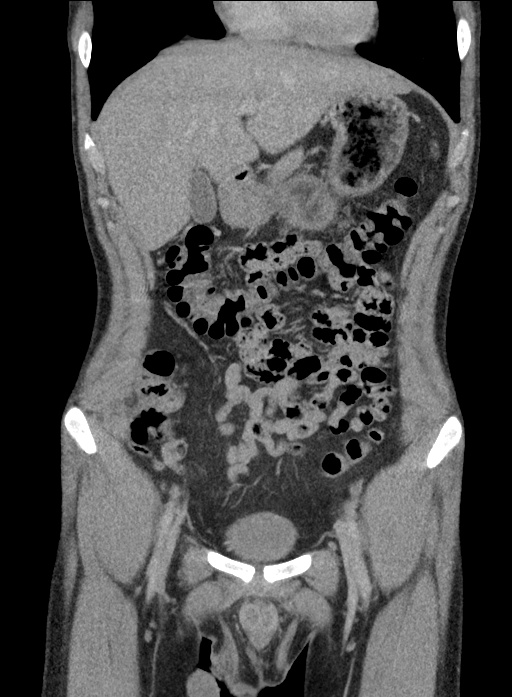
[im 52/117  soft-tissue]
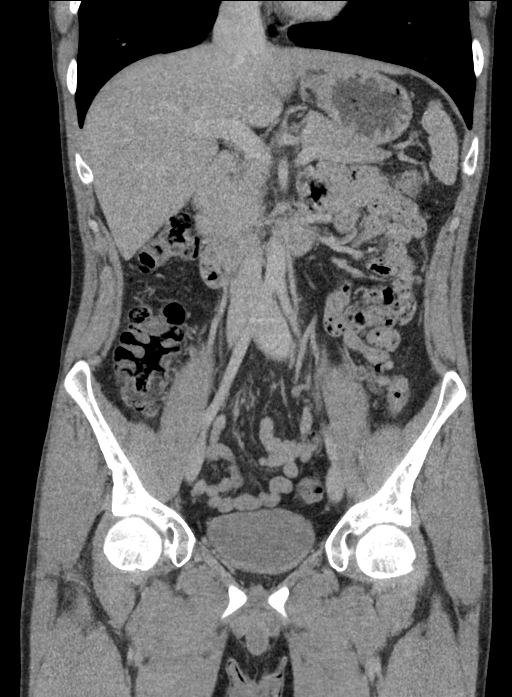
[im 65/117  soft-tissue]
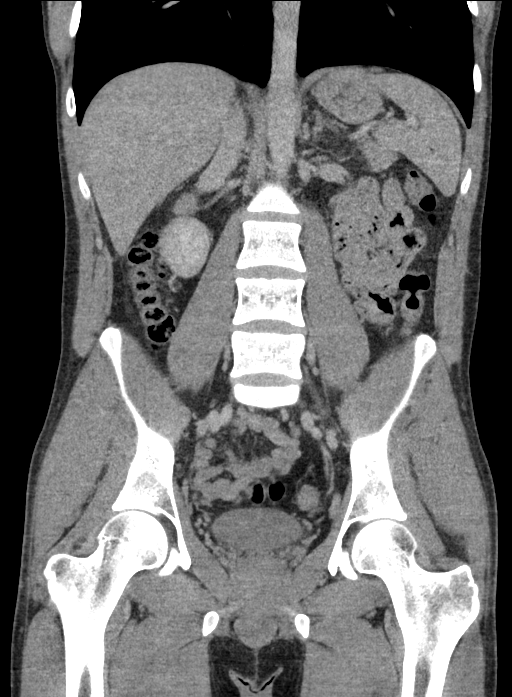

[16 of 46 positions shown; findings below may reference images not displayed]

FINDINGS: Lower chest:  No contributory findings.

Hepatobiliary: No focal liver abnormality.No evidence of biliary
obstruction or stone.

Pancreas: Unremarkable.

Spleen: Unremarkable.

Adrenals/Urinary Tract: Negative adrenals. No hydronephrosis or
stone. Unremarkable bladder.

Stomach/Bowel:  No obstruction. No appendicitis.

Vascular/Lymphatic: No acute vascular abnormality. No mass or
adenopathy.

Reproductive:No pathologic findings.

Other: No ascites or pneumoperitoneum.

Musculoskeletal: No acute abnormalities. Incidental L5 limbus
vertebra.
IMPRESSION: Negative abdominal CT.

## 2018-11-30 DIAGNOSIS — S42001A Fracture of unspecified part of right clavicle, initial encounter for closed fracture: Secondary | ICD-10-CM

## 2018-11-30 HISTORY — DX: Fracture of unspecified part of right clavicle, initial encounter for closed fracture: S42.001A

## 2018-12-27 ENCOUNTER — Encounter: Payer: Self-pay | Admitting: Physician Assistant

## 2018-12-27 ENCOUNTER — Other Ambulatory Visit: Payer: Self-pay

## 2018-12-27 ENCOUNTER — Ambulatory Visit: Payer: Self-pay | Admitting: Physician Assistant

## 2018-12-27 VITALS — BP 120/80 | HR 88 | Temp 98.6°F | Ht 70.5 in | Wt 132.0 lb

## 2018-12-27 DIAGNOSIS — Z1159 Encounter for screening for other viral diseases: Secondary | ICD-10-CM

## 2018-12-27 DIAGNOSIS — K6289 Other specified diseases of anus and rectum: Secondary | ICD-10-CM

## 2018-12-27 DIAGNOSIS — K625 Hemorrhage of anus and rectum: Secondary | ICD-10-CM

## 2018-12-27 MED ORDER — HYDROCORTISONE ACETATE 25 MG RE SUPP: 25.0000 mg | Freq: Every day | RECTAL | 1 refills | Status: DC

## 2018-12-27 NOTE — Patient Instructions (Signed)
Due to recent COVID-19 restrictions implemented by [employer] and state authorities and in an effort to keep both patients and staff as safe as possible, [facility] requires COVID-19 testing prior to any scheduled endoscopic procedure. The testing center is located at [address] [place], [place] [address] in the [facility] [employer]  suite.  Your appointment has been scheduled for 01/02/2019 at 10am.   Please bring your insurance cards to this appointment. You will require your COVID screen 2 business days prior to your endoscopic procedure.  You are not required to quarantine after your screening.  You will only receive a phone call with the results if it is POSITIVE.  If you do not receive a call the day before your procedure you should begin your prep, if ordered, and you should report to the endo center for your procedure at your designated appointment arrival time ( one hour prior to the procedure time). There is no cost to you for the screening on the day of the swab.  [facility] will file with your insurance company for the testing.    You may receive an automated phone call prior to your procedure or have a message in your MyChart that you have an appointment for a BP/15 at the [facility], please disregard this message.  Your testing will be at the [place] [place] location.   If you are leaving [facility] travel [place] on [address], turn left onto [address], turn night onto [address] at the 1st stop light turn right, pass the [employer] on your right and proceed to [place] (white building).   You have been scheduled for a flexible sigmoidoscopy. Please follow the written instructions given to you at your visit today. If you use inhalers (even only as needed), please bring them with you on the day of your  procedure.    Purchase Advanced Recticare apply to area four times daily as needed  We will send hydrocortisone suppositories to your pharmacy  Drink plenty of water 40-60 ounces daily  Use OTC stool softners like colace as needed  Thank you for choosing [facility]  Amy Shane Crutch

## 2018-12-27 NOTE — Progress Notes (Signed)
Subjective:    Patient ID: Mark Mcdaniel, male    DOB: 1990-05-30, 28 y.o.   MRN: [id number]  HPI Mark Mcdaniel is a 28 year old white male, new to GI today, self-referred for evaluation of rectal pain and bleeding.  He does have history of EtOH abuse and depression.  He last had an ER visit in May 2020 with EtOH withdrawal. He says his current symptoms have been present intermittently over the past 5 to 6 years.  In the past he says he would have a flareup of symptoms for 1 to 2 weeks which would then resolve and not bother him for quite a while.  Now over the past 1 month he has been dealing with fairly constant rectal discomfort and pain.  He says it hurts with every bowel movement, and he feels that he has a hemorrhoid that pops out with bowel movements.  Generally with time this will spontaneously reduce.  He has been seeing frequent small-volume bright red blood with bowel movements. He is also developed some constipation which she thinks is primarily related to the fact that he is not wanting to have a bowel movement for fear of pain. He has tried over-the-counter suppositories and Preparation H which is somewhat helpful but has not resolved the symptoms. Family history is negative for colon cancer and polyps as far as he is aware. He has no complaints of abdominal discomfort.  Review of Systems Pertinent positive and negative review of systems were noted in the above HPI section.  All other review of systems was otherwise negative.  Outpatient Encounter Medications as of 12/27/2018  Medication Sig  . hydrocortisone (ANUSOL-HC) 25 MG suppository Place 1 suppository (25 mg total) rectally at bedtime. At bedtime  for 10 days then repeat as needed  . [DISCONTINUED] naltrexone (DEPADE) 50 MG tablet Take 1 tablet (50 mg total) by mouth daily.  . [DISCONTINUED] naproxen (NAPROSYN) 500 MG tablet Take 1 tablet (500 mg total) by mouth 2 (two) times daily.   No facility-administered encounter  medications on file as of 12/27/2018.    Allergies  Allergen Reactions  . Tape Other (See Comments)    Medical tapes irritates the patient's skin  . Tea Nausea And Vomiting   Patient Active Problem List   Diagnosis Date Noted  . Alcohol use disorder, moderate, dependence ([place]) 07/08/2018  . Alcoholic ketoacidosis [id number]  . Alcohol withdrawal syndrome without complication ([place])   . Alcohol abuse with alcohol-induced mood disorder ([place]) 09/18/2017  . Major depressive disorder, single episode, severe ([place]) 09/18/2017  . Substance induced mood disorder ([place]) 09/18/2017  . Suicidal thoughts    Social History   Socioeconomic History  . Marital status: Single    Spouse name: Not on file  . Number of children: 1  . Years of education: Not on file  . Highest education level: Not on file  Occupational History  . Not on file  Social Needs  . Financial resource strain: Not on file  . Food insecurity    Worry: Not on file    Inability: Not on file  . Transportation needs    Medical: Not on file    Non-medical: Not on file  Tobacco Use  . Smoking status: Current Every Day Smoker    Packs/day: 1.50    Years: 15.00    Pack years: 22.50    Types: Cigarettes  . Smokeless tobacco: Never Used  Substance and Sexual Activity  . Alcohol use: Not Currently    Comment:  not currently-recovering  . Drug use: Yes    Frequency: 1.0 times per week    Types: Marijuana    Comment: stated he quit THC one month ago  . Sexual activity: Not Currently  Lifestyle  . Physical activity    Days per week: Not on file    Minutes per session: Not on file  . Stress: Not on file  Relationships  . Social Herbalist on phone: Not on file    Gets together: Not on file    Attends religious service: Not on file    Active member of club or organization: Not on file    Attends meetings of clubs or organizations: Not on file    Relationship status: Not on file  . Intimate partner violence     Fear of current or ex partner: Not on file    Emotionally abused: Not on file    Physically abused: Not on file    Forced sexual activity: Not on file  Other Topics Concern  . Not on file  Social History Narrative  . Not on file    Mr. Gerleman family history includes Breast cancer in his paternal grandmother; Diabetes in his maternal grandmother; Heart disease in an other family member; Kidney disease in his maternal grandmother.      Objective:    Vitals:   12/27/18 1011  BP: 120/80  Pulse: 88  Temp: 98.6 F (37 C)    Physical Exam Well-developed well-nourished thin young white male male in no acute distress.  Height, Weight, 132 BMI 18.6  HEENT; nontraumatic normocephalic, EOMI, PER R LA, sclera anicteric. Oropharynx; not examined/mask/Covid Neck; supple, no JVD Cardiovascular; regular rate and rhythm with S1-S2, no murmur rub or gallop Pulmonary; Clear bilaterally Abdomen; soft, nontender, nondistended, no palpable mass or hepatosplenomegaly, bowel sounds are active Rectal; small noninflamed external hemorrhoids noted, no prolapsed internal hemorrhoid, very uncomfortable with rectal exam,, stool heme positive, no lesion palpable Skin; benign exam, no jaundice rash or appreciable lesions Extremities; no clubbing cyanosis or edema skin warm and dry Neuro/Psych; alert and oriented x4, grossly nonfocal mood and affect appropriate      Assessment & Plan:   #77 28 year old white male with 1 month history of fairly constant anal rectal pain, worse with bowel movements and associated with bright red blood with bowel movements. By history patient may have grade 3 internal hemorrhoid, however on exam no prolapse or palpable internal hemorrhoid.  Stool is heme positive, no fissure palpable  #2 prior history of EtOH abuse #3 history of depression  Plan; patient will be scheduled for flexible sigmoidoscopy with Dr. Carlean Purl.  Procedure was discussed in detail with the patient  including indications risks and benefits and he is agreeable to proceed. Start Anusol HC/hydrocortisone suppository nightly x10 days, then repeat course as needed. Over-the-counter stool softener such as Colace as needed, increase water intake. Advised to get RectiCare advanced over-the-counter which contains lidocaine and use 3-4 times daily externally as needed. We briefly discussed option of in office banding for internal hemorrhoids, if he is not found to have any other pathology of flexible sigmoidoscopy.  At this time patient is not sure whether he would want to pursue that or not.  Buck Mcaffee S Zyrion Coey PA-C 12/27/2018   Cc: Enid Skeens., MD

## 2019-01-02 LAB — SARS CORONAVIRUS 2 (TAT 6-24 HRS): SARS Coronavirus 2: NEGATIVE

## 2019-01-04 ENCOUNTER — Other Ambulatory Visit: Payer: Self-pay

## 2019-01-04 ENCOUNTER — Encounter: Payer: Self-pay | Admitting: Internal Medicine

## 2019-01-04 ENCOUNTER — Ambulatory Visit (INDEPENDENT_AMBULATORY_CARE_PROVIDER_SITE_OTHER)
Admission: RE | Admit: 2019-01-04 | Discharge: 2019-01-04 | Disposition: A | Discharge status: Home / Self Care | Payer: Self-pay | Source: Ambulatory Visit | Attending: Internal Medicine | Admitting: Internal Medicine

## 2019-01-04 ENCOUNTER — Ambulatory Visit (AMBULATORY_SURGERY_CENTER): Payer: Self-pay | Admitting: Internal Medicine

## 2019-01-04 ENCOUNTER — Other Ambulatory Visit: Payer: Self-pay | Admitting: Internal Medicine

## 2019-01-04 VITALS — BP 121/79 | HR 76 | Temp 98.4°F | Resp 21 | Ht 70.0 in | Wt 132.0 lb

## 2019-01-04 DIAGNOSIS — K644 Residual hemorrhoidal skin tags: Secondary | ICD-10-CM

## 2019-01-04 DIAGNOSIS — Z86010 Personal history of colonic polyps: Secondary | ICD-10-CM

## 2019-01-04 DIAGNOSIS — D125 Benign neoplasm of sigmoid colon: Secondary | ICD-10-CM

## 2019-01-04 DIAGNOSIS — K648 Other hemorrhoids: Secondary | ICD-10-CM

## 2019-01-04 DIAGNOSIS — K6289 Other specified diseases of anus and rectum: Secondary | ICD-10-CM

## 2019-01-04 DIAGNOSIS — Z860101 Personal history of adenomatous and serrated colon polyps: Secondary | ICD-10-CM

## 2019-01-04 HISTORY — DX: Personal history of colonic polyps: Z86.010

## 2019-01-04 HISTORY — DX: Personal history of adenomatous and serrated colon polyps: Z86.0101

## 2019-01-04 HISTORY — PX: FLEXIBLE SIGMOIDOSCOPY: SHX1649

## 2019-01-04 MED ORDER — SODIUM CHLORIDE 0.9 % IV SOLN: 500.0000 mL | Freq: Once | INTRAVENOUS | Status: DC

## 2019-01-04 NOTE — Patient Instructions (Addendum)
Hemorrhoids are causing the bleeding.  You must have fractured your tail bone in the past - I want to xray that to understand it better. You will go to xray department before leaving today.  I found and removed a tiny colon polyp.  Will call with results and recommendations.  I appreciate the opportunity to care for you. Gatha Mayer, MD, FACG  YOU HAD AN ENDOSCOPIC PROCEDURE TODAY AT [facility]:   Refer to the procedure report that was given to you for any specific questions about what was found during the examination.  If the procedure report does not answer your questions, please call your gastroenterologist to clarify.  If you requested that your care partner not be given the details of your procedure findings, then the procedure report has been included in a sealed envelope for you to review at your convenience later.  YOU SHOULD EXPECT: Some feelings of bloating in the abdomen. Passage of more gas than usual.  Walking can help get rid of the air that was put into your GI tract during the procedure and reduce the bloating. If you had a lower endoscopy (such as a colonoscopy or flexible sigmoidoscopy) you may notice spotting of blood in your stool or on the toilet paper. If you underwent a bowel prep for your procedure, you may not have a normal bowel movement for a few days.  Please Note:  You might notice some irritation and congestion in your nose or some drainage.  This is from the oxygen used during your procedure.  There is no need for concern and it should clear up in a day or so.  SYMPTOMS TO REPORT IMMEDIATELY:   Following lower endoscopy (colonoscopy or flexible sigmoidoscopy):  Excessive amounts of blood in the stool  Significant tenderness or worsening of abdominal pains  Swelling of the abdomen that is new, acute  Fever of 100F or higher  For urgent or emergent issues, a gastroenterologist can be reached at any hour by calling [phone]  [phone].   DIET:  We do recommend a small meal at first, but then you may proceed to your regular diet.  Drink plenty of fluids but you should avoid alcoholic beverages for 24 hours.  ACTIVITY:  You should plan to take it easy for the rest of today and you should NOT DRIVE or use heavy machinery until tomorrow (because of the sedation medicines used during the test).    FOLLOW UP: Our staff will call the number listed on your records 48-72 hours following your procedure to check on you and address any questions or concerns that you may have regarding the information given to you following your procedure. If we do not reach you, we will leave a message.  We will attempt to reach you two times.  During this call, we will ask if you have developed any symptoms of COVID 19. If you develop any symptoms (ie: fever, flu-like symptoms, shortness of breath, cough etc.) before then, please call [phone].  If you test positive for Covid 19 in the 2 weeks post procedure, please call and report this information to [place].    If any biopsies were taken you will be contacted by phone or by letter within the next 1-3 weeks.  Please call us at [phone] if you have not heard about the biopsies in 3 weeks.    SIGNATURES/CONFIDENTIALITY: You and/or your care partner have signed paperwork which will be entered into your electronic medical record.  These signatures attest to the fact that that the information above on your After Visit Summary has been reviewed and is understood.  Full responsibility of the confidentiality of this discharge information lies with you and/or your care-partner.

## 2019-01-04 NOTE — Progress Notes (Signed)
PT taken to PACU. Monitors in place. VSS. Report given to RN. 

## 2019-01-04 NOTE — Op Note (Signed)
[place] Patient Name: Mark Mcdaniel Procedure Date: 01/04/2019 2:26 PM MRN: [id number] Endoscopist: Gatha Mayer , MD Age: 28 Referring MD:  Date of Birth: November 30, 1990 Gender: Male Account #: [id number] Procedure:                Flexible Sigmoidoscopy Indications:              Rectal hemorrhage, Rectal pain Medicines:                Propofol per Anesthesia, Monitored Anesthesia Care Procedure:                Pre-Anesthesia Assessment:                           - Prior to the procedure, a History and Physical                            was performed, and patient medications and                            allergies were reviewed. The patient's tolerance of                            previous anesthesia was also reviewed. The risks                            and benefits of the procedure and the sedation                            options and risks were discussed with the patient.                            All questions were answered, and informed consent                            was obtained. Prior Anticoagulants: The patient has                            taken no previous anticoagulant or antiplatelet                            agents. ASA Grade Assessment: II - A patient with                            mild systemic disease. After reviewing the risks                            and benefits, the patient was deemed in                            satisfactory condition to undergo the procedure.                           After obtaining informed consent, the scope was  passed under direct vision. The Colonoscope was                            introduced through the anus and advanced to the the                            left transverse colon. The flexible sigmoidoscopy                            was accomplished without difficulty. The patient                            tolerated the procedure well. The quality of the   bowel preparation was good. Scope In: 2:36:10 PM Scope Out: 2:44:36 PM Total Procedure Duration: 0 hours 8 minutes 26 seconds  Findings:                 The perianal exam findings include bony prominence                            proximal coccyx.                           The digital rectal exam findings include mobile                            distal coccyx.                           External and internal hemorrhoids were found. The                            hemorrhoids were Grade II (internal hemorrhoids                            that prolapse but reduce spontaneously).                           A diminutive polyp was found in the sigmoid colon.                            The polyp was sessile. The polyp was removed with a                            cold snare. Resection and retrieval were complete.                            Verification of patient identification for the                            specimen was done. Estimated blood loss was minimal.                           The exam was otherwise without abnormality. Complications:            No  immediate complications. Estimated Blood Loss:     Estimated blood loss was minimal. Impression:               - Bony prominence proximal coccyx found on perianal                            exam.                           - Mobile distal coccyx found on digital rectal exam.                           - External and internal hemorrhoids. Anoscopy also                            performed                           - One diminutive polyp in the sigmoid colon,                            removed with a cold snare. Resected and retrieved.                           - The examination was otherwise normal. into right                            colon though prep not adequate there Recommendation:           - Patient has a contact number available for                            emergencies. The signs and symptoms of potential                             delayed complications were discussed with the                            patient. Return to normal activities tomorrow.                            Written discharge instructions were provided to the                            patient.                           - Resume previous diet.                           - Await polyp pathology                           Sacrum/coccyx xray today Gatha Mayer, MD 01/04/2019 2:58:47 PM This report has been signed electronically.

## 2019-01-04 NOTE — Progress Notes (Signed)
VS done by [place]. Temp by LC.

## 2019-01-04 NOTE — Progress Notes (Signed)
Called to room to assist during endoscopic procedure.  Patient ID and intended procedure confirmed with present staff. Received instructions for my participation in the procedure from the performing physician.  

## 2019-01-08 ENCOUNTER — Telehealth: Payer: Self-pay

## 2019-01-08 NOTE — Progress Notes (Signed)
Results discussed with patient See phone note

## 2019-01-08 NOTE — Telephone Encounter (Signed)
Spoke to Mr. Prizzi and scheduled him for an appointment on Fri 02-02-2019 3:10pm.

## 2019-01-08 NOTE — Telephone Encounter (Signed)
  Follow up Call-  Call back number 01/04/2019  Post procedure Call Back phone  # [phone]  Permission to leave phone message Yes  Some recent data might be hidden     Patient questions:  Do you have a fever, pain , or abdominal swelling? No. Pain Score  0 *  Have you tolerated food without any problems? Yes.    Have you been able to return to your normal activities? Yes.    Do you have any questions about your discharge instructions: Diet   No. Medications  No. Follow up visit  No.  Do you have questions or concerns about your Care? No.  Actions: * If pain score is 4 or above: No action needed, pain <4.  A little leakage afterwards, concerned about that and wanted to speak about x ray results.   1. Have you developed a fever since your procedure? No  2.   Have you had an respiratory symptoms (SOB or cough) since your procedure? No  3.   Have you tested positive for COVID 19 since your procedure No  4.   Have you had any family members/close contacts diagnosed with the COVID 19 since your procedure? No  If yes to any of these questions please route to Joylene John, RN and Alphonsa Gin, RN.

## 2019-01-08 NOTE — Telephone Encounter (Signed)
I reviewed xray results  He is interested in hemorrhoid banding to treat prolapsed internal hemorrhoids  Yesi,  Please arrange for him to have next available banding slot

## 2019-01-17 NOTE — Progress Notes (Signed)
Polyp was a benign precancerous adenoma  He should have a complete colonoscopy  We can schedule now and cancel 12/4 office visit or he can wait on it but if deductible paid down might want to do before end of year  Please call from office - no letter from [facility] but place a 04/2019 colon recall for safety

## 2019-01-22 ENCOUNTER — Telehealth: Payer: Self-pay | Admitting: Internal Medicine

## 2019-01-22 NOTE — Telephone Encounter (Signed)
See pathology results for details 

## 2019-02-01 ENCOUNTER — Ambulatory Visit (AMBULATORY_SURGERY_CENTER): Payer: Self-pay

## 2019-02-01 ENCOUNTER — Other Ambulatory Visit: Payer: Self-pay

## 2019-02-01 VITALS — Temp 97.7°F | Ht 70.5 in | Wt 140.4 lb

## 2019-02-01 DIAGNOSIS — Z86010 Personal history of colonic polyps: Secondary | ICD-10-CM

## 2019-02-01 NOTE — Progress Notes (Signed)
Denies allergies to eggs or soy products. Denies complication of anesthesia or sedation. Denies use of weight loss medication. Denies use of O2.   Emmi instructions given for colonoscopy.  Covid screening Wednesday 02/14/19 @ 2:30 Pm.

## 2019-02-02 ENCOUNTER — Encounter: Payer: Self-pay | Admitting: Internal Medicine

## 2019-02-02 ENCOUNTER — Ambulatory Visit (INDEPENDENT_AMBULATORY_CARE_PROVIDER_SITE_OTHER): Payer: Self-pay | Admitting: Internal Medicine

## 2019-02-02 VITALS — BP 110/74 | HR 77 | Temp 98.5°F | Ht 70.5 in | Wt 137.0 lb

## 2019-02-02 DIAGNOSIS — Z86010 Personal history of colonic polyps: Secondary | ICD-10-CM

## 2019-02-02 DIAGNOSIS — K642 Third degree hemorrhoids: Secondary | ICD-10-CM

## 2019-02-02 NOTE — Progress Notes (Signed)
   Hemorrhoidal ligation for grade 3 prolapsing hemorrhoid symptoms include rectal bleeding discomfort rectal pressure  Rectal exam normal anoderm there is some bulge of the external hemorrhoid in the left lateral decubitus position with Valsalva  Digital exam shows a moderately long anal canal with what seems like palpable hemorrhoids some discomfort no discrete tenderness and no fissure no mass  Anoscopy shows a grade 2 left lateral internal hemorrhoid (he describes grade 3 prolapse by history) and grade 1-2 right anterior and right posterior hemorrhoidal columns.   PROCEDURE NOTE: The patient presents with symptomatic grade grade 2 ~3 hemorrhoids, requesting rubber band ligation of his/her hemorrhoidal disease.  All risks, benefits and alternative forms of therapy were described and informed consent was obtained.   The anorectum was pre-medicated with 0.125% nitroglycerin and 5% lidocaine topical The decision was made to band the left lateral internal hemorrhoid, and the [place] was used to perform band ligation without complication.  Digital anorectal examination was then performed to assure proper positioning of the band, and to adjust the banded tissue as required.  The patient was discharged home without pain or other issues.  Dietary and behavioral recommendations were given and along with follow-up instructions.     The following adjunctive treatments were recommended:   He will be seen for a colonoscopy on December 18, he had an adenoma on his flex sig so he needs a complete colonoscopy.  I will reassess his hemorrhoid symptoms and determine next steps at that time. No complications were encountered and the patient tolerated the procedure well.  I appreciate the opportunity to care for this patient. CC: Enid Skeens., MD

## 2019-02-02 NOTE — Patient Instructions (Signed)
HEMORRHOID BANDING PROCEDURE    FOLLOW-UP CARE   1. The procedure you have had should have been relatively painless since the banding of the area involved does not have nerve endings and there is no pain sensation.  The rubber band cuts off the blood supply to the hemorrhoid and the band may fall off as soon as 48 hours after the banding (the band may occasionally be seen in the toilet bowl following a bowel movement). You may notice a temporary feeling of fullness in the rectum which should respond adequately to plain Tylenol or Motrin.  2. Following the banding, avoid strenuous exercise that evening and resume full activity the next day.  A sitz bath (soaking in a warm tub) or bidet is soothing, and can be useful for cleansing the area after bowel movements.     3. To avoid constipation, take two tablespoons of natural wheat bran, natural oat bran, flax, Benefiber or any over the counter fiber supplement and increase your water intake to 7-8 glasses daily.    4. Unless you have been prescribed anorectal medication, do not put anything inside your rectum for two weeks: No suppositories, enemas, fingers, etc.  5. Occasionally, you may have more bleeding than usual after the banding procedure.  This is often from the untreated hemorrhoids rather than the treated one.  Don't be concerned if there is a tablespoon or so of blood.  If there is more blood than this, lie flat with your bottom higher than your head and apply an ice pack to the area. If the bleeding does not stop within a half an hour or if you feel faint, call our office at [phone] or go to the emergency room.  6. Problems are not common; however, if there is a substantial amount of bleeding, severe pain, chills, fever or difficulty passing urine (very rare) or other problems, you should call us at [phone] or report to the nearest emergency room.  7. Do not stay seated continuously for more than 2-3 hours for a day or two  after the procedure.  Tighten your buttock muscles 10-15 times every two hours and take 10-15 deep breaths every 1-2 hours.  Do not spend more than a few minutes on the toilet if you cannot empty your bowel; instead re-visit the toilet at a later time.    Dr Carlean Purl will follow up with you at your colonoscopy visit.    I appreciate the opportunity to care for you. Silvano Rusk, MD, [facility]

## 2019-02-02 NOTE — Assessment & Plan Note (Signed)
Left lateral banded today

## 2019-02-14 ENCOUNTER — Other Ambulatory Visit: Payer: Self-pay | Admitting: Internal Medicine

## 2019-02-14 ENCOUNTER — Ambulatory Visit (INDEPENDENT_AMBULATORY_CARE_PROVIDER_SITE_OTHER): Payer: Self-pay

## 2019-02-14 DIAGNOSIS — Z1159 Encounter for screening for other viral diseases: Secondary | ICD-10-CM

## 2019-02-15 LAB — SARS CORONAVIRUS 2 (TAT 6-24 HRS): SARS Coronavirus 2: NEGATIVE

## 2019-02-16 ENCOUNTER — Other Ambulatory Visit: Payer: Self-pay

## 2019-02-16 ENCOUNTER — Encounter: Payer: Self-pay | Admitting: Internal Medicine

## 2019-02-16 ENCOUNTER — Ambulatory Visit (AMBULATORY_SURGERY_CENTER): Payer: Self-pay | Admitting: Internal Medicine

## 2019-02-16 VITALS — BP 121/74 | HR 59 | Temp 98.4°F | Resp 15 | Ht 70.5 in | Wt 140.4 lb

## 2019-02-16 DIAGNOSIS — K635 Polyp of colon: Secondary | ICD-10-CM

## 2019-02-16 DIAGNOSIS — Z86010 Personal history of colonic polyps: Secondary | ICD-10-CM

## 2019-02-16 DIAGNOSIS — D125 Benign neoplasm of sigmoid colon: Secondary | ICD-10-CM

## 2019-02-16 HISTORY — PX: COLONOSCOPY: SHX174

## 2019-02-16 MED ORDER — SODIUM CHLORIDE 0.9 % IV SOLN: 500.0000 mL | Freq: Once | INTRAVENOUS | Status: DC

## 2019-02-16 NOTE — Patient Instructions (Addendum)
I found another tiny polyp and removed it.  I am glad the hemorrhoid is better.  If it becomes a problem again let me know - sometimes I have to repeat a banding on them.  I will let you know pathology results and when to have another routine colonoscopy by mail and/or My Chart.  I appreciate the opportunity to care for you. Gatha Mayer, MD, FACG  YOU HAD AN ENDOSCOPIC PROCEDURE TODAY AT [facility]:   Refer to the procedure report that was given to you for any specific questions about what was found during the examination.  If the procedure report does not answer your questions, please call your gastroenterologist to clarify.  If you requested that your care partner not be given the details of your procedure findings, then the procedure report has been included in a sealed envelope for you to review at your convenience later.  YOU SHOULD EXPECT: Some feelings of bloating in the abdomen. Passage of more gas than usual.  Walking can help get rid of the air that was put into your GI tract during the procedure and reduce the bloating. If you had a lower endoscopy (such as a colonoscopy or flexible sigmoidoscopy) you may notice spotting of blood in your stool or on the toilet paper. If you underwent a bowel prep for your procedure, you may not have a normal bowel movement for a few days.  Please Note:  You might notice some irritation and congestion in your nose or some drainage.  This is from the oxygen used during your procedure.  There is no need for concern and it should clear up in a day or so.  SYMPTOMS TO REPORT IMMEDIATELY:   Following lower endoscopy (colonoscopy or flexible sigmoidoscopy):  Excessive amounts of blood in the stool  Significant tenderness or worsening of abdominal pains  Swelling of the abdomen that is new, acute  Fever of 100F or higher  For urgent or emergent issues, a gastroenterologist can be reached at any hour by calling [phone]  [phone].   DIET:  We do recommend a small meal at first, but then you may proceed to your regular diet.  Drink plenty of fluids but you should avoid alcoholic beverages for 24 hours.  ACTIVITY:  You should plan to take it easy for the rest of today and you should NOT DRIVE or use heavy machinery until tomorrow (because of the sedation medicines used during the test).    FOLLOW UP: Our staff will call the number listed on your records 48-72 hours following your procedure to check on you and address any questions or concerns that you may have regarding the information given to you following your procedure. If we do not reach you, we will leave a message.  We will attempt to reach you two times.  During this call, we will ask if you have developed any symptoms of COVID 19. If you develop any symptoms (ie: fever, flu-like symptoms, shortness of breath, cough etc.) before then, please call [phone].  If you test positive for Covid 19 in the 2 weeks post procedure, please call and report this information to [place].    If any biopsies were taken you will be contacted by phone or by letter within the next 1-3 weeks.  Please call us at [phone] if you have not heard about the biopsies in 3 weeks.    SIGNATURES/CONFIDENTIALITY: You and/or your care partner have signed paperwork which will be entered into  your electronic medical record.  These signatures attest to the fact that that the information above on your After Visit Summary has been reviewed and is understood.  Full responsibility of the confidentiality of this discharge information lies with you and/or your care-partner.

## 2019-02-16 NOTE — Progress Notes (Signed)
Pt's states no medical or surgical changes since previsit or office visit.  Vitals by CW Temp by Hiram Comber

## 2019-02-16 NOTE — Op Note (Signed)
[place] Patient Name: Mark Mcdaniel Procedure Date: 02/16/2019 3:21 PM MRN: [id number] Endoscopist: Gatha Mayer , MD Age: 28 Referring MD:  Date of Birth: 11/16/1990 Gender: Male Account #: [id number] Procedure:                Colonoscopy Indications:              Colon polyp seen on flexible sigmoidoscopy removed                            and was adenoma - for full colon exam, Medicines:                Propofol per Anesthesia, Monitored Anesthesia Care Procedure:                Pre-Anesthesia Assessment:                           - Prior to the procedure, a History and Physical                            was performed, and patient medications and                            allergies were reviewed. The patient's tolerance of                            previous anesthesia was also reviewed. The risks                            and benefits of the procedure and the sedation                            options and risks were discussed with the patient.                            All questions were answered, and informed consent                            was obtained. Prior Anticoagulants: The patient has                            taken no previous anticoagulant or antiplatelet                            agents. ASA Grade Assessment: II - A patient with                            mild systemic disease. After reviewing the risks                            and benefits, the patient was deemed in                            satisfactory condition to undergo the procedure.  After obtaining informed consent, the colonoscope                            was passed under direct vision. Throughout the                            procedure, the patient's blood pressure, pulse, and                            oxygen saturations were monitored continuously. The                            Colonoscope was introduced through the anus and   advanced to the the cecum, identified by                            appendiceal orifice and ileocecal valve. The                            colonoscopy was performed without difficulty. The                            patient tolerated the procedure well. The quality                            of the bowel preparation was good. The ileocecal                            valve, appendiceal orifice, and rectum were                            photographed. The bowel preparation used was                            Miralax via split dose instruction. Scope In: 3:24:42 PM Scope Out: 3:34:38 PM Scope Withdrawal Time: 0 hours 7 minutes 17 seconds  Total Procedure Duration: 0 hours 9 minutes 56 seconds  Findings:                 The perianal exam findings include deformed coccyx.                           The digital rectal exam findings include mobile                            coccyx. Pertinent negatives include normal prostate                            (size, shape, and consistency).                           A 1 to 2 mm polyp was found in the proximal sigmoid                            colon. The polyp  was sessile. The polyp was removed                            with a cold biopsy forceps. Resection and retrieval                            were complete. Verification of patient                            identification for the specimen was done. Estimated                            blood loss was minimal.                           Internal hemorrhoids were found.                           Ulcerated mucosa were present in the rectum. From                            recent hemorrhoid ligation                           The exam was otherwise without abnormality on                            direct and retroflexion views. Complications:            No immediate complications. Estimated Blood Loss:     Estimated blood loss was minimal. Impression:               - Deformed coccyx found on perianal  exam.                           - Mobile coccyx found on digital rectal exam.                           - One 1 to 2 mm polyp in the proximal sigmoid                            colon, removed with a cold biopsy forceps. Resected                            and retrieved.                           - Internal hemorrhoids.                           - Mucosal ulceration. in rectum from recent                            hemorrhoid liigation left lateral                           -  The examination was otherwise normal on direct                            and retroflexion views. Recommendation:           - Patient has a contact number available for                            emergencies. The signs and symptoms of potential                            delayed complications were discussed with the                            patient. Return to normal activities tomorrow.                            Written discharge instructions were provided to the                            patient.                           - Resume previous diet.                           - Continue present medications.                           - Repeat colonoscopy is recommended. The                            colonoscopy date will be determined after pathology                            results from today's exam become available for                            review.                           - So far has had resolution of prolapsed hemorrhoid                            - will f/u prn for that Gatha Mayer, MD 02/16/2019 3:43:33 PM This report has been signed electronically.

## 2019-02-16 NOTE — Progress Notes (Signed)
Called to room to assist during endoscopic procedure.  Patient ID and intended procedure confirmed with present staff. Received instructions for my participation in the procedure from the performing physician.  

## 2019-02-16 NOTE — Progress Notes (Signed)
To recovery. Awake. Tolerated procedure well. VSS.

## 2019-02-20 ENCOUNTER — Telehealth: Payer: Self-pay | Admitting: *Deleted

## 2019-02-20 NOTE — Telephone Encounter (Signed)
  Follow up Call-  Call back number 02/16/2019 01/04/2019  Post procedure Call Back phone  # [phone]  Permission to leave phone message Yes Yes  Some recent data might be hidden     Patient questions:  Do you have a fever, pain , or abdominal swelling? No. Pain Score  0 *  Have you tolerated food without any problems? Yes.    Have you been able to return to your normal activities? Yes.    Do you have any questions about your discharge instructions: Diet   No. Medications  No. Follow up visit  No.  Do you have questions or concerns about your Care? No.  Actions: * If pain score is 4 or above: No action needed, pain <4.  1. Have you developed a fever since your procedure? no  2.   Have you had an respiratory symptoms (SOB or cough) since your procedure? no  3.   Have you tested positive for COVID 19 since your procedure no  4.   Have you had any family members/close contacts diagnosed with the COVID 19 since your procedure?  no   If yes to any of these questions please route to Joylene John, RN and Alphonsa Gin, Therapist, sports.

## 2019-03-01 ENCOUNTER — Encounter: Payer: Self-pay | Admitting: Internal Medicine

## 2019-03-01 DIAGNOSIS — Z86010 Personal history of colonic polyps: Secondary | ICD-10-CM

## 2020-03-03 ENCOUNTER — Ambulatory Visit
Admission: EM | Admit: 2020-03-03 | Discharge: 2020-03-03 | Disposition: A | Discharge status: Home / Self Care | Payer: Self-pay | Attending: Emergency Medicine | Admitting: Emergency Medicine

## 2020-03-03 DIAGNOSIS — J069 Acute upper respiratory infection, unspecified: Secondary | ICD-10-CM | POA: Insufficient documentation

## 2020-03-03 LAB — POCT RAPID STREP A (OFFICE): Rapid Strep A Screen: NEGATIVE

## 2020-03-03 MED ORDER — BENZONATATE 200 MG PO CAPS: 200.0000 mg | ORAL_CAPSULE | Freq: Three times a day (TID) | ORAL | 0 refills | Status: AC | PRN

## 2020-03-03 MED ORDER — IBUPROFEN 800 MG PO TABS: 800.0000 mg | ORAL_TABLET | Freq: Three times a day (TID) | ORAL | 0 refills | Status: DC

## 2020-03-03 NOTE — ED Triage Notes (Signed)
Pt c/o sore throat with swelling feeling to lt side of throat since Friday. States on Saturday was coughing hard and now having pain to lt ribs where its difficulty to breath. States had to leave work early today d/t pain. No distress noted. Pt speaking in complete sentences.

## 2020-03-03 NOTE — ED Provider Notes (Signed)
[facility]    CSN: [id number] Arrival date & time: 03/03/20  1048      History   Chief Complaint Chief Complaint  Patient presents with  . Sore Throat    HPI Mark Mcdaniel is a 30 y.o. male presenting today for evaluation of sore throat.  Reports that he has had sore throat and left-sided swelling over the past 4 days.  Has had associated cough and left-sided rib discomfort.  Reports grandparents with similar symptoms.  Denies known Covid exposures.  HPI  Past Medical History:  Diagnosis Date  . ADHD (attention deficit hyperactivity disorder)   . Alcohol abuse   . Alcohol withdrawal syndrome without complication ([place])   . Alcoholic ketoacidosis [id number]  . Anxiety   . Asthma   . Depression    suicidal in past  . History of adenomatous polyp of colon 01/04/2019  . Right clavicle fracture 11/2018    Patient Active Problem List   Diagnosis Date Noted  . Prolapsed internal hemorrhoids, grade 3 02/02/2019  . History of adenomatous polyp of colon 01/04/2019  . Alcohol use disorder, moderate, dependence ([place]) 07/08/2018  . Alcohol abuse with alcohol-induced mood disorder ([place]) 09/18/2017  . Major depressive disorder, single episode, severe ([place]) 09/18/2017  . Substance induced mood disorder ([place]) 09/18/2017    Past Surgical History:  Procedure Laterality Date  . HERNIA REPAIR     Performed when 52 months old       Home Medications    Prior to Admission medications   Medication Sig Start Date End Date Taking? Authorizing Provider  benzonatate (TESSALON) 200 MG capsule Take 1 capsule (200 mg total) by mouth 3 (three) times daily as needed for up to 7 days for cough. 03/03/20 03/10/20 Yes Taejah Ohalloran C, PA-C  ibuprofen (ADVIL) 800 MG tablet Take 1 tablet (800 mg total) by mouth 3 (three) times daily. 03/03/20  Yes Natasja Niday, Elesa Hacker, PA-C    Family History Family History  Problem Relation Age of Onset  . Diabetes Maternal Grandmother   . Kidney  disease Maternal Grandmother   . Breast cancer Paternal Grandmother   . Heart disease Other   . Colon cancer Cousin   . Esophageal cancer Neg Hx   . Rectal cancer Neg Hx   . Stomach cancer Neg Hx     Social History Social History   Tobacco Use  . Smoking status: Current Every Day Smoker    Packs/day: 1.50    Years: 15.00    Pack years: 22.50    Types: Cigarettes  . Smokeless tobacco: Never Used  Vaping Use  . Vaping Use: Never used  Substance Use Topics  . Alcohol use: Yes  . Drug use: Not Currently    Frequency: 1.0 times per week    Types: Marijuana    Comment: stated he quit THC one month ago     Allergies   Tape and Tea   Review of Systems Review of Systems  Constitutional: Negative for activity change, appetite change, chills, fatigue and fever.  HENT: Positive for congestion, rhinorrhea, sinus pressure and sore throat. Negative for ear pain and trouble swallowing.   Eyes: Negative for discharge and redness.  Respiratory: Positive for cough. Negative for chest tightness and shortness of breath.   Cardiovascular: Negative for chest pain.  Gastrointestinal: Negative for abdominal pain, diarrhea, nausea and vomiting.  Musculoskeletal: Negative for myalgias.  Skin: Negative for rash.  Neurological: Negative for dizziness, light-headedness and headaches.  Physical Exam Triage Vital Signs ED Triage Vitals  Enc Vitals Group     BP 03/03/20 1417 (!) 142/99     Pulse Rate 03/03/20 1417 96     Resp 03/03/20 1417 18     Temp 03/03/20 1417 98.8 F (37.1 C)     Temp Source 03/03/20 1417 Oral     SpO2 03/03/20 1417 98 %     Weight --      Height --      Head Circumference --      Peak Flow --      Pain Score 03/03/20 1418 6     Pain Loc --      Pain Edu? --      Excl. in [place]? --    No data found.  Updated Vital Signs BP (!) 142/99 (BP Location: Left Arm)   Pulse 96   Temp 98.8 F (37.1 C) (Oral)   Resp 18   SpO2 98%   Visual Acuity Right Eye  Distance:   Left Eye Distance:   Bilateral Distance:    Right Eye Near:   Left Eye Near:    Bilateral Near:     Physical Exam Vitals and nursing note reviewed.  Constitutional:      Appearance: He is well-developed and well-nourished.     Comments: No acute distress  HENT:     Head: Normocephalic and atraumatic.     Ears:     Comments: Bilateral ears without tenderness to palpation of external auricle, tragus and mastoid, EAC's without erythema or swelling, TM's with good bony landmarks and cone of light. Non erythematous.     Nose: Nose normal.     Mouth/Throat:     Comments: Oral mucosa pink and moist, no tonsillar enlargement or exudate, right tonsillar area erythematous without exudate, no soft palate swelling, posterior pharynx patent and nonerythematous, no uvula deviation or swelling. Normal phonation. Eyes:     Conjunctiva/sclera: Conjunctivae normal.  Cardiovascular:     Rate and Rhythm: Normal rate.  Pulmonary:     Effort: Pulmonary effort is normal. No respiratory distress.     Comments: Breathing comfortably at rest, CTABL, no wheezing, rales or other adventitious sounds auscultated  Significant reproducible tenderness to mild palpation to left lower ribs Abdominal:     General: There is no distension.  Musculoskeletal:        General: Normal range of motion.     Cervical back: Neck supple.  Skin:    General: Skin is warm and dry.  Neurological:     Mental Status: He is alert and oriented to person, place, and time.  Psychiatric:        Mood and Affect: Mood and affect normal.      UC Treatments / Results  Labs (all labs ordered are listed, but only abnormal results are displayed) Labs Reviewed  NOVEL CORONAVIRUS, NAA  CULTURE, GROUP A STREP [facility])  POCT RAPID STREP A (OFFICE)    EKG   Radiology No results found.  Procedures Procedures (including critical care time)  Medications Ordered in UC Medications - No data to display  Initial  Impression / Assessment and Plan / UC Course  I have reviewed the triage vital signs and the nursing notes.  Pertinent labs & imaging results that were available during my care of the patient were reviewed by me and considered in my medical decision making (see chart for details).     Covid test pending for screening, strep test  negative, suspect likely viral etiology, no sign of peritonsillar abscess/tonsillitis at this time.  Lungs clear to auscultation with stable vital signs.  Recommend symptomatic and supportive care rest and fluids.  Discussed strict return precautions. Patient verbalized understanding and is agreeable with plan.  Final Clinical Impressions(s) / UC Diagnoses   Final diagnoses:  Viral URI with cough     Discharge Instructions     Covid test pending, strep negative Ibuprofen and Tylenol for sore throat and discomfort in chest Cough medicine as needed Rest and fluids Follow-up if not improving or worsening    ED Prescriptions    Medication Sig Dispense Auth. Provider   ibuprofen (ADVIL) 800 MG tablet Take 1 tablet (800 mg total) by mouth 3 (three) times daily. 21 tablet Neziah Vogelgesang C, PA-C   benzonatate (TESSALON) 200 MG capsule Take 1 capsule (200 mg total) by mouth 3 (three) times daily as needed for up to 7 days for cough. 28 capsule Amry Cathy, [place] C, PA-C     PDMP not reviewed this encounter.   Janith Lima, [place] 03/03/20 1517

## 2020-03-03 NOTE — Discharge Instructions (Addendum)
Covid test pending, strep negative Ibuprofen and Tylenol for sore throat and discomfort in chest Cough medicine as needed Rest and fluids Follow-up if not improving or worsening

## 2020-03-05 LAB — NOVEL CORONAVIRUS, NAA: SARS-CoV-2, NAA: NOT DETECTED

## 2020-03-05 LAB — SARS-COV-2, NAA 2 DAY TAT

## 2020-03-06 LAB — CULTURE, GROUP A STREP (THRC)

## 2021-01-08 ENCOUNTER — Other Ambulatory Visit: Payer: Self-pay

## 2021-01-08 ENCOUNTER — Encounter: Payer: Self-pay | Admitting: Emergency Medicine

## 2021-01-08 ENCOUNTER — Ambulatory Visit
Admission: EM | Admit: 2021-01-08 | Discharge: 2021-01-08 | Disposition: A | Discharge status: Home / Self Care | Payer: Commercial Managed Care - PPO | Attending: Internal Medicine | Admitting: Internal Medicine

## 2021-01-08 DIAGNOSIS — J069 Acute upper respiratory infection, unspecified: Secondary | ICD-10-CM | POA: Diagnosis not present

## 2021-01-08 DIAGNOSIS — R0602 Shortness of breath: Secondary | ICD-10-CM

## 2021-01-08 LAB — POCT INFLUENZA A/B
Influenza A, POC: NEGATIVE
Influenza B, POC: NEGATIVE

## 2021-01-08 MED ORDER — ALBUTEROL SULFATE HFA 108 (90 BASE) MCG/ACT IN AERS: 1.0000 | INHALATION_SPRAY | Freq: Four times a day (QID) | RESPIRATORY_TRACT | 0 refills | Status: DC | PRN

## 2021-01-08 MED ORDER — PREDNISONE 20 MG PO TABS: 40.0000 mg | ORAL_TABLET | Freq: Every day | ORAL | 0 refills | Status: AC

## 2021-01-08 NOTE — ED Provider Notes (Signed)
[facility]    CSN: [id number] Arrival date & time: 01/08/21  1252      History   Chief Complaint Chief Complaint  Patient presents with   Cough    HPI Mark Mcdaniel is a 30 y.o. male.   Patient presents with productive cough with clear sputum, occasional wheezing, intermittent shortness of breath, body aches, nasal congestion that has been present for 4 days.  Patient reports that he does have a history of asthma but that he has not had any complications in multiple years.  Denies any known fevers.  Patient reports that 2 family numbers have similar symptoms.  Patient has taken Alka-Seltzer and Mucinex with minimal improvement in symptoms.  Denies nausea, vomiting, diarrhea, sore throat, abdominal pain.   Cough  Past Medical History:  Diagnosis Date   ADHD (attention deficit hyperactivity disorder)    Alcohol abuse    Alcohol withdrawal syndrome without complication (H[facility])    Alcoholic ketoacidosis 09/06/2954   Anxiety    Asthma    Depression    suicidal in past   History of adenomatous polyp of colon 01/04/2019   Right clavicle fracture 11/2018    Patient Active Problem List   Diagnosis Date Noted   Prolapsed internal hemorrhoids, grade 3 02/02/2019   History of adenomatous polyp of colon 01/04/2019   Alcohol use disorder, moderate, dependence ([place]) 07/08/2018   Alcohol abuse with alcohol-induced mood disorder ([place]) 09/18/2017   Major depressive disorder, single episode, severe ([place]) 09/18/2017   Substance induced mood disorder ([place]) 09/18/2017    Past Surgical History:  Procedure Laterality Date   HERNIA REPAIR     Performed when 40 months old       Home Medications    Prior to Admission medications   Medication Sig Start Date End Date Taking? Authorizing Provider  albuterol (VENTOLIN HFA) 108 (90 Base) MCG/ACT inhaler Inhale 1-2 puffs into the lungs every 6 (six) hours as needed for wheezing or shortness of breath. 01/08/21  Yes ,  Hildred Alamin E, FNP  ibuprofen (ADVIL) 800 MG tablet Take 1 tablet (800 mg total) by mouth 3 (three) times daily. 03/03/20  Yes Wieters, Hallie C, PA-C  predniSONE (DELTASONE) 20 MG tablet Take 2 tablets (40 mg total) by mouth daily for 5 days. 01/08/21 01/13/21 Yes Teodora Medici, FNP    Family History Family History  Problem Relation Age of Onset   Diabetes Maternal Grandmother    Kidney disease Maternal Grandmother    Breast cancer Paternal Grandmother    Heart disease Other    Colon cancer Cousin    Esophageal cancer Neg Hx    Rectal cancer Neg Hx    Stomach cancer Neg Hx     Social History Social History   Tobacco Use   Smoking status: Every Day    Packs/day: 1.50    Years: 15.00    Pack years: 22.50    Types: Cigarettes   Smokeless tobacco: Never  Vaping Use   Vaping Use: Never used  Substance Use Topics   Alcohol use: Yes   Drug use: Not Currently    Frequency: 1.0 times per week    Types: Marijuana    Comment: stated he quit THC one month ago     Allergies   Tape and Tea   Review of Systems Review of Systems Per HPI  Physical Exam Triage Vital Signs ED Triage Vitals  Enc Vitals Group     BP 01/08/21 1353 120/77  Pulse Rate 01/08/21 1353 90     Resp 01/08/21 1353 18     Temp 01/08/21 1353 98.2 F (36.8 C)     Temp Source 01/08/21 1353 Oral     SpO2 01/08/21 1353 98 %     Weight 01/08/21 1354 135 lb (61.2 kg)     Height 01/08/21 1354 6' (1.829 m)     Head Circumference --      Peak Flow --      Pain Score 01/08/21 1353 0     Pain Loc --      Pain Edu? --      Excl. in [place]? --    No data found.  Updated Vital Signs BP 120/77 (BP Location: Left Arm)   Pulse 90   Temp 98.2 F (36.8 C) (Oral)   Resp 18   Ht 6' (1.829 m)   Wt 135 lb (61.2 kg)   SpO2 98%   BMI 18.31 kg/m   Visual Acuity Right Eye Distance:   Left Eye Distance:   Bilateral Distance:    Right Eye Near:   Left Eye Near:    Bilateral Near:     Physical  Exam Constitutional:      General: He is not in acute distress.    Appearance: Normal appearance. He is not toxic-appearing or diaphoretic.  HENT:     Head: Normocephalic and atraumatic.     Right Ear: Tympanic membrane and ear canal normal.     Left Ear: Tympanic membrane and ear canal normal.     Nose: Congestion present.     Mouth/Throat:     Mouth: Mucous membranes are moist.     Pharynx: No posterior oropharyngeal erythema.  Eyes:     Extraocular Movements: Extraocular movements intact.     Conjunctiva/sclera: Conjunctivae normal.     Pupils: Pupils are equal, round, and reactive to light.  Cardiovascular:     Rate and Rhythm: Normal rate and regular rhythm.     Pulses: Normal pulses.     Heart sounds: Normal heart sounds.  Pulmonary:     Effort: Pulmonary effort is normal. No respiratory distress.     Breath sounds: Normal breath sounds. No stridor. No wheezing, rhonchi or rales.  Abdominal:     General: Abdomen is flat. Bowel sounds are normal.     Palpations: Abdomen is soft.  Musculoskeletal:        General: Normal range of motion.     Cervical back: Normal range of motion.  Skin:    General: Skin is warm and dry.  Neurological:     General: No focal deficit present.     Mental Status: He is alert and oriented to person, place, and time. Mental status is at baseline.  Psychiatric:        Mood and Affect: Mood normal.        Behavior: Behavior normal.     UC Treatments / Results  Labs (all labs ordered are listed, but only abnormal results are displayed) Labs Reviewed  NOVEL CORONAVIRUS, NAA  POCT INFLUENZA A/B    EKG   Radiology No results found.  Procedures Procedures (including critical care time)  Medications Ordered in UC Medications - No data to display  Initial Impression / Assessment and Plan / UC Course  I have reviewed the triage vital signs and the nursing notes.  Pertinent labs & imaging results that were available during my care of  the patient were reviewed by me and  considered in my medical decision making (see chart for details).     Patient presents with symptoms likely from a viral upper respiratory infection. Differential includes bacterial pneumonia, sinusitis, allergic rhinitis, COVID 19. Do not suspect underlying cardiopulmonary process. Symptoms seem unlikely related to ACS, CHF or COPD exacerbations, pneumonia, pneumothorax. Patient is nontoxic appearing and not in need of emergent medical intervention.  Rapid flu was negative.  COVID-19 PCR pending.  Recommended symptom control with over the counter medications: Daily oral anti-histamine, Oral decongestant or IN corticosteroid, saline irrigations, cepacol lozenges, Robitussin, Delsym, honey tea.  Patient at high risk for active exacerbation.  Will prescribe prednisone steroid x5 days.  Albuterol inhaler also prescribed.  Do not think chest imaging is necessary given no adventitious lung sounds and no respiratory distress on exam.  Return if symptoms fail to improve in 1-2 weeks or you develop shortness of breath, chest pain, severe headache. Patient states understanding and is agreeable.  Discharged with PCP followup.  Final Clinical Impressions(s) / UC Diagnoses   Final diagnoses:  Viral upper respiratory tract infection with cough  Shortness of breath     Discharge Instructions      You likely having a viral upper respiratory infection. We recommended symptom control. I expect your symptoms to start improving in the next 1-2 weeks.   1. Take a daily allergy pill/anti-histamine like Zyrtec, Claritin, or Store brand consistently for 2 weeks  2. For congestion you may try an oral decongestant like Mucinex or sudafed. You may also try intranasal flonase nasal spray or saline irrigations (neti pot, sinus cleanse)  3. For your sore throat you may try cepacol lozenges, salt water gargles, throat spray. Treatment of congestion may also help your sore  throat.  4. For cough you may try Robitussen, Mucinex DM  5. Take Tylenol or Ibuprofen to help with pain/inflammation  6. Stay hydrated, drink plenty of fluids to keep throat coated and less irritated  Honey Tea For cough/sore throat try using a honey-based tea. Use 3 teaspoons of honey with juice squeezed from half lemon. Place shaved pieces of ginger into 1/2-1 cup of water and warm over stove top. Then mix the ingredients and repeat every 4 hours as needed.   You have been prescribed prednisone steroid to decrease inflammation and help with shortness of breath.  Albuterol inhaler has also been sent for you.  COVID-19 test is pending.  Rapid flu was negative.  We will call if COVID test is positive.     ED Prescriptions     Medication Sig Dispense Auth. Provider   predniSONE (DELTASONE) 20 MG tablet Take 2 tablets (40 mg total) by mouth daily for 5 days. 10 tablet [place], Hildred Alamin E, [place]   albuterol (VENTOLIN HFA) 108 (90 Base) MCG/ACT inhaler Inhale 1-2 puffs into the lungs every 6 (six) hours as needed for wheezing or shortness of breath. 1 each Teodora Medici, [place]      PDMP not reviewed this encounter.   Teodora Medici, [place] 01/08/21 [phone]

## 2021-01-08 NOTE — ED Triage Notes (Signed)
Patient c/o chest congestion, wheezing, productive cough, body aches x 4 days.  Patient has taken Alka-Seltzer and Mucinex.  Patient is not vaccinated for COVID.

## 2021-01-08 NOTE — Discharge Instructions (Signed)
You likely having a viral upper respiratory infection. We recommended symptom control. I expect your symptoms to start improving in the next 1-2 weeks.   1. Take a daily allergy pill/anti-histamine like Zyrtec, Claritin, or Store brand consistently for 2 weeks  2. For congestion you may try an oral decongestant like Mucinex or sudafed. You may also try intranasal flonase nasal spray or saline irrigations (neti pot, sinus cleanse)  3. For your sore throat you may try cepacol lozenges, salt water gargles, throat spray. Treatment of congestion may also help your sore throat.  4. For cough you may try Robitussen, Mucinex DM  5. Take Tylenol or Ibuprofen to help with pain/inflammation  6. Stay hydrated, drink plenty of fluids to keep throat coated and less irritated  Honey Tea For cough/sore throat try using a honey-based tea. Use 3 teaspoons of honey with juice squeezed from half lemon. Place shaved pieces of ginger into 1/2-1 cup of water and warm over stove top. Then mix the ingredients and repeat every 4 hours as needed.   You have been prescribed prednisone steroid to decrease inflammation and help with shortness of breath.  Albuterol inhaler has also been sent for you.  COVID-19 test is pending.  Rapid flu was negative.  We will call if COVID test is positive.

## 2021-01-09 LAB — NOVEL CORONAVIRUS, NAA: SARS-CoV-2, NAA: NOT DETECTED

## 2021-01-09 LAB — SARS-COV-2, NAA 2 DAY TAT

## 2021-02-08 ENCOUNTER — Emergency Department (HOSPITAL_COMMUNITY)
Admission: EM | Admit: 2021-02-08 | Discharge: 2021-02-09 | Disposition: A | Discharge status: Home / Self Care | Payer: Commercial Managed Care - PPO | Attending: Emergency Medicine | Admitting: Emergency Medicine

## 2021-02-08 ENCOUNTER — Encounter (HOSPITAL_COMMUNITY): Payer: Self-pay | Admitting: Emergency Medicine

## 2021-02-08 ENCOUNTER — Other Ambulatory Visit: Payer: Self-pay

## 2021-02-08 ENCOUNTER — Emergency Department (HOSPITAL_COMMUNITY): Payer: Commercial Managed Care - PPO

## 2021-02-08 DIAGNOSIS — J45909 Unspecified asthma, uncomplicated: Secondary | ICD-10-CM | POA: Diagnosis not present

## 2021-02-08 DIAGNOSIS — R1110 Vomiting, unspecified: Secondary | ICD-10-CM | POA: Diagnosis present

## 2021-02-08 DIAGNOSIS — F17210 Nicotine dependence, cigarettes, uncomplicated: Secondary | ICD-10-CM | POA: Insufficient documentation

## 2021-02-08 DIAGNOSIS — U071 COVID-19: Secondary | ICD-10-CM

## 2021-02-08 DIAGNOSIS — R112 Nausea with vomiting, unspecified: Secondary | ICD-10-CM

## 2021-02-08 LAB — CBC WITH DIFFERENTIAL/PLATELET
Abs Immature Granulocytes: 0.08 10*3/uL — ABNORMAL HIGH (ref 0.00–0.07)
Basophils Absolute: 0 10*3/uL (ref 0.0–0.1)
Basophils Relative: 0 %
Eosinophils Absolute: 0 10*3/uL (ref 0.0–0.5)
Eosinophils Relative: 0 %
HCT: 37.9 % — ABNORMAL LOW (ref 39.0–52.0)
Hemoglobin: 11.6 g/dL — ABNORMAL LOW (ref 13.0–17.0)
Immature Granulocytes: 1 %
Lymphocytes Relative: 2 %
Lymphs Abs: 0.3 10*3/uL — ABNORMAL LOW (ref 0.7–4.0)
MCH: 24.1 pg — ABNORMAL LOW (ref 26.0–34.0)
MCHC: 30.6 g/dL (ref 30.0–36.0)
MCV: 78.6 fL — ABNORMAL LOW (ref 80.0–100.0)
Monocytes Absolute: 1.4 10*3/uL — ABNORMAL HIGH (ref 0.1–1.0)
Monocytes Relative: 10 %
Neutro Abs: 12.4 10*3/uL — ABNORMAL HIGH (ref 1.7–7.7)
Neutrophils Relative %: 87 %
Platelets: 324 10*3/uL (ref 150–400)
RBC: 4.82 MIL/uL (ref 4.22–5.81)
RDW: 21.9 % — ABNORMAL HIGH (ref 11.5–15.5)
WBC: 14.2 10*3/uL — ABNORMAL HIGH (ref 4.0–10.5)
nRBC: 0 % (ref 0.0–0.2)

## 2021-02-08 LAB — COMPREHENSIVE METABOLIC PANEL
ALT: 30 U/L (ref 0–44)
AST: 34 U/L (ref 15–41)
Albumin: 4.3 g/dL (ref 3.5–5.0)
Alkaline Phosphatase: 56 U/L (ref 38–126)
Anion gap: 14 (ref 5–15)
BUN: 12 mg/dL (ref 6–20)
CO2: 20 mmol/L — ABNORMAL LOW (ref 22–32)
Calcium: 9.3 mg/dL (ref 8.9–10.3)
Chloride: 107 mmol/L (ref 98–111)
Creatinine, Ser: 0.85 mg/dL (ref 0.61–1.24)
GFR, Estimated: >60 mL/min (ref >60)
Glucose, Bld: 104 mg/dL — ABNORMAL HIGH (ref 70–99)
Potassium: 3.7 mmol/L (ref 3.5–5.1)
Sodium: 141 mmol/L (ref 135–145)
Total Bilirubin: 0.7 mg/dL (ref 0.3–1.2)
Total Protein: 7.6 g/dL (ref 6.5–8.1)

## 2021-02-08 LAB — RESP PANEL BY RT-PCR (FLU A&B, COVID) ARPGX2
Influenza A by PCR: NEGATIVE
Influenza B by PCR: NEGATIVE
SARS Coronavirus 2 by RT PCR: POSITIVE — AB

## 2021-02-08 LAB — LIPASE, BLOOD: Lipase: 26 U/L (ref 11–51)

## 2021-02-08 LAB — ETHANOL: Alcohol, Ethyl (B): <10 mg/dL (ref <10)

## 2021-02-08 LAB — MAGNESIUM: Magnesium: 1.6 mg/dL — ABNORMAL LOW (ref 1.7–2.4)

## 2021-02-08 MED ORDER — ACETAMINOPHEN 500 MG PO TABS
500.0000 mg | ORAL_TABLET | Freq: Once | ORAL | Status: AC
  Administered 2021-02-08: 500 mg via ORAL
  Filled 2021-02-08: qty 1

## 2021-02-08 MED ORDER — NIRMATRELVIR/RITONAVIR (PAXLOVID)TABLET: 3.0000 | ORAL_TABLET | Freq: Two times a day (BID) | ORAL | 0 refills | Status: AC

## 2021-02-08 MED ORDER — LACTATED RINGERS IV BOLUS
1000.0000 mL | Freq: Once | INTRAVENOUS | Status: AC
  Administered 2021-02-08: 1000 mL via INTRAVENOUS

## 2021-02-08 MED ORDER — PROCHLORPERAZINE EDISYLATE 10 MG/2ML IJ SOLN
5.0000 mg | Freq: Once | INTRAMUSCULAR | Status: AC
  Administered 2021-02-08: 5 mg via INTRAVENOUS
  Filled 2021-02-08: qty 2

## 2021-02-08 MED ORDER — ONDANSETRON 4 MG PO TBDP
4.0000 mg | ORAL_TABLET | Freq: Once | ORAL | Status: AC
  Administered 2021-02-08: 4 mg via ORAL
  Filled 2021-02-08: qty 1

## 2021-02-08 MED ORDER — ONDANSETRON 4 MG PO TBDP: 4.0000 mg | ORAL_TABLET | Freq: Three times a day (TID) | ORAL | 0 refills | Status: DC | PRN

## 2021-02-08 NOTE — ED Triage Notes (Addendum)
Pt states he is here for alcohol poisoning.  Reports severe abd pain and vomiting since waking up at 6am.  Dry heaving.

## 2021-02-08 NOTE — ED Notes (Signed)
PT dry heaving.  MD notified and responded.

## 2021-02-08 NOTE — ED Notes (Signed)
Patient transported to X-ray 

## 2021-02-08 NOTE — ED Provider Notes (Signed)
Emergency Medicine Provider Triage Evaluation Note  Mark Mcdaniel , a 30 y.o. male  was evaluated in triage.  Pt complains of vomiting.  He states that he normally drinks alcohol about 4 days out of the week, his last drink was last night.  He states he did not drink any more than usual.  He has been continuously vomiting since he woke up this morning.  He denies diarrhea or fevers.  His entire abdomen hurts.  Review of Systems  Positive: Abdominal pain, vomiting Negative: Syncope  Physical Exam  BP 131/75   Pulse 71   Temp 98.3 F (36.8 C) (Oral)   Resp 20   SpO2 100%  Gen:   Awake, appears uncomfortable, loudly dry heaving Resp:  Normal effort  MSK:   Moves extremities without difficulty  Other:  Abdomen is generally tender.  Medical Decision Making  Medically screening exam initiated at 5:17 PM.  Appropriate orders placed.  Levander Katzenstein was informed that the remainder of the evaluation will be completed by another provider, this initial triage assessment does not replace that evaluation, and the importance of remaining in the ED until their evaluation is complete.  Patient presents today for evaluation of nausea and vomiting since this morning. Will order labs.  He does use marijuana frequently so he may have an element of cannabinoid hyperemesis.  Zofran ordered.    Lorin Glass, PA-C 02/08/21 1744    Truddie Hidden, MD 02/08/21 1949

## 2021-02-08 NOTE — ED Provider Notes (Signed)
[facility] EMERGENCY DEPARTMENT Provider Note   CSN: [id number] Arrival date & time: 02/08/21  1704     History No chief complaint on file.   Mark Mcdaniel is a 30 y.o. male.  HPI Patient presents with vomiting.  Abdominal pain.  Began this morning.  States he was feeling okay last night.  Just got back from [place].  Is a heavy alcohol drinker.  Drinks about 4 nights a night.  Drank last night but it was a normal amount.  Woke up with vomiting this morning.  No diarrhea.  States he has been vomiting a lot.  States his abdomen hurts it hurts more from all the vomiting.  No fevers.  No chills.  States he does feel bad.    Past Medical History:  Diagnosis Date   ADHD (attention deficit hyperactivity disorder)    Alcohol abuse    Alcohol withdrawal syndrome without complication (HCC)    Alcoholic ketoacidosis 7/0/0174   Anxiety    Asthma    Depression    suicidal in past   History of adenomatous polyp of colon 01/04/2019   Right clavicle fracture 11/2018    Patient Active Problem List   Diagnosis Date Noted   Prolapsed internal hemorrhoids, grade 3 02/02/2019   History of adenomatous polyp of colon 01/04/2019   Alcohol use disorder, moderate, dependence ([place]) 07/08/2018   Alcohol abuse with alcohol-induced mood disorder ([place]) 09/18/2017   Major depressive disorder, single episode, severe ([place]) 09/18/2017   Substance induced mood disorder ([place]) 09/18/2017    Past Surgical History:  Procedure Laterality Date   HERNIA REPAIR     Performed when 29 months old       Family History  Problem Relation Age of Onset   Diabetes Maternal Grandmother    Kidney disease Maternal Grandmother    Breast cancer Paternal Grandmother    Heart disease Other    Colon cancer Cousin    Esophageal cancer Neg Hx    Rectal cancer Neg Hx    Stomach cancer Neg Hx     Social History   Tobacco Use   Smoking status: Every Day    Packs/day: 1.50    Years: 15.00     Pack years: 22.50    Types: Cigarettes   Smokeless tobacco: Never  Vaping Use   Vaping Use: Never used  Substance Use Topics   Alcohol use: Yes   Drug use: Yes    Frequency: 1.0 times per week    Types: Marijuana    Home Medications Prior to Admission medications   Medication Sig Start Date End Date Taking? Authorizing Provider  nirmatrelvir/ritonavir EUA (PAXLOVID) 20 x 150 MG & 10 x 100MG  TABS Take 3 tablets by mouth 2 (two) times daily for 5 days. Patient GFR is 60. Take nirmatrelvir (150 mg) two tablets twice daily for 5 days and ritonavir (100 mg) one tablet twice daily for 5 days. 02/08/21 02/13/21 Yes Davonna Belling, MD  ondansetron (ZOFRAN-ODT) 4 MG disintegrating tablet Take 1 tablet (4 mg total) by mouth every 8 (eight) hours as needed for nausea or vomiting. 02/08/21  Yes Davonna Belling, MD  albuterol (VENTOLIN HFA) 108 (90 Base) MCG/ACT inhaler Inhale 1-2 puffs into the lungs every 6 (six) hours as needed for wheezing or shortness of breath. 01/08/21   Teodora Medici, FNP  ibuprofen (ADVIL) 800 MG tablet Take 1 tablet (800 mg total) by mouth 3 (three) times daily. 03/03/20   Wieters, Elesa Hacker, PA-C  Allergies    Tape and Tea  Review of Systems   Review of Systems  Constitutional:  Positive for appetite change. Negative for fatigue.  HENT:  Negative for congestion.   Respiratory:  Negative for shortness of breath.   Cardiovascular:  Negative for chest pain.  Gastrointestinal:  Positive for abdominal pain, nausea and vomiting.  Genitourinary:  Negative for flank pain.  Musculoskeletal:  Negative for back pain.  Skin:  Negative for rash.  Neurological:  Negative for weakness.  Psychiatric/Behavioral:  Negative for confusion.    Physical Exam Updated Vital Signs BP 131/75   Pulse 71   Temp 99.6 F (37.6 C) (Oral)   Resp 20   SpO2 100%   Physical Exam Vitals and nursing note reviewed.  HENT:     Head: Atraumatic.  Eyes:     Pupils: Pupils are equal,  round, and reactive to light.  Cardiovascular:     Rate and Rhythm: Regular rhythm.  Pulmonary:     Effort: Pulmonary effort is normal.  Abdominal:     Comments: Mild diffuse tenderness without rebound or guarding.  No hernias palpated.  Musculoskeletal:        General: No tenderness.     Cervical back: Neck supple.  Skin:    Coloration: Skin is pale.  Neurological:     General: No focal deficit present.     Mental Status: He is alert.    ED Results / Procedures / Treatments   Labs (all labs ordered are listed, but only abnormal results are displayed) Labs Reviewed  RESP PANEL BY RT-PCR (FLU A&B, COVID) ARPGX2 - Abnormal; Notable for the following components:      Result Value   SARS Coronavirus 2 by RT PCR POSITIVE (*)    All other components within normal limits  COMPREHENSIVE METABOLIC PANEL - Abnormal; Notable for the following components:   CO2 20 (*)    Glucose, Bld 104 (*)    All other components within normal limits  CBC WITH DIFFERENTIAL/PLATELET - Abnormal; Notable for the following components:   WBC 14.2 (*)    Hemoglobin 11.6 (*)    HCT 37.9 (*)    MCV 78.6 (*)    MCH 24.1 (*)    RDW 21.9 (*)    Neutro Abs 12.4 (*)    Lymphs Abs 0.3 (*)    Monocytes Absolute 1.4 (*)    Abs Immature Granulocytes 0.08 (*)    All other components within normal limits  MAGNESIUM - Abnormal; Notable for the following components:   Magnesium 1.6 (*)    All other components within normal limits  LIPASE, BLOOD  ETHANOL  RAPID URINE DRUG SCREEN, HOSP PERFORMED  URINALYSIS, ROUTINE W REFLEX MICROSCOPIC    EKG None  Radiology DG Abdomen Acute W/Chest  Result Date: 02/08/2021 CLINICAL DATA:  Nausea and vomiting. EXAM: DG ABDOMEN ACUTE WITH 1 VIEW CHEST COMPARISON:  09/23/2015 FINDINGS: There is no evidence of dilated bowel loops or free intraperitoneal air. No definite evidence of renal calculus. Phleboliths are noted in the pelvis on the right. Heart size and mediastinal  contours are within normal limits. Hyperinflation of the lungs is noted. Both lungs are clear. IMPRESSION: Negative abdominal radiographs.  No acute cardiopulmonary disease. Electronically Signed   By: Brett Fairy M.D.   On: 02/08/2021 21:11    Procedures Procedures   Medications Ordered in ED Medications  acetaminophen (TYLENOL) tablet 500 mg (has no administration in time range)  ondansetron (ZOFRAN-ODT) disintegrating tablet 4  mg (4 mg Oral Given 02/08/21 1740)  lactated ringers bolus 1,000 mL (0 mLs Intravenous Stopped 02/08/21 2029)  lactated ringers bolus 1,000 mL (1,000 mLs Intravenous New Bag/Given 02/08/21 2037)  prochlorperazine (COMPAZINE) injection 5 mg (5 mg Intravenous Given 02/08/21 2035)    ED Course  I have reviewed the triage vital signs and the nursing notes.  Pertinent labs & imaging results that were available during my care of the patient were reviewed by me and considered in my medical decision making (see chart for details).    MDM Rules/Calculators/A&P                           Patient with nausea and vomiting.  Does have an alcohol abuse history.  However found to be warm and COVID-positive.  Imaging reassuring.  Feels better after fluids.  Lab work overall reassuring.  Will likely discharge home if tolerates orals.  Has been given Zofran fluids and then some Compazine.  Appears to be higher risk and will give Paxil bid.  Care turned over to Dr. Wyvonnia Dusky. Final Clinical Impression(s) / ED Diagnoses Final diagnoses:  COVID-19  Nausea and vomiting, unspecified vomiting type    Rx / DC Orders ED Discharge Orders          Ordered    nirmatrelvir/ritonavir EUA (PAXLOVID) 20 x 150 MG & 10 x 100MG  TABS  2 times daily        02/08/21 2316    ondansetron (ZOFRAN-ODT) 4 MG disintegrating tablet  Every 8 hours PRN        02/08/21 2316             Davonna Belling, MD 02/08/21 2317

## 2021-02-08 NOTE — ED Notes (Signed)
Pt able to tolerate fluids.  Denies pain.  MD notified.

## 2021-05-27 ENCOUNTER — Ambulatory Visit
Admission: EM | Admit: 2021-05-27 | Discharge: 2021-05-27 | Disposition: A | Discharge status: Home / Self Care | Payer: Commercial Managed Care - PPO | Attending: Internal Medicine | Admitting: Internal Medicine

## 2021-05-27 ENCOUNTER — Encounter: Payer: Self-pay | Admitting: Emergency Medicine

## 2021-05-27 ENCOUNTER — Other Ambulatory Visit: Payer: Self-pay

## 2021-05-27 DIAGNOSIS — A084 Viral intestinal infection, unspecified: Secondary | ICD-10-CM

## 2021-05-27 MED ORDER — ONDANSETRON 4 MG PO TBDP: 4.0000 mg | ORAL_TABLET | Freq: Three times a day (TID) | ORAL | 0 refills | Status: DC | PRN

## 2021-05-27 NOTE — ED Triage Notes (Signed)
Patient c/o generalized abdominal pain since yesterday. Patient is having some diarrhea, nausea and vomiting.  Patient denies any OTC meds. ?

## 2021-05-27 NOTE — Discharge Instructions (Signed)
It appears that you have a viral stomach virus. You have been prescribed a nausea medication to take as needed. Please ensure adequate fluid hydration.  ?

## 2021-05-27 NOTE — ED Provider Notes (Signed)
?[facility] ? ? ? ?CSN: [id number] ?Arrival date & time: 05/27/21  1201 ? ? ?  ? ?History   ?Chief Complaint ?Chief Complaint  ?Patient presents with  ? Abdominal Pain  ? ? ?HPI ?Mark Mcdaniel is a 31 y.o. male.  ? ?Patient presents with generalized stomachache, nausea, vomiting, diarrhea that started yesterday.  Denies any known sick contacts or fevers.  Denies any associated upper respiratory symptoms.  Denies blood in stool or emesis.  Patient has been able to keep fluids down.  Patient has not taken any medications to help alleviate symptoms.  Patient has a legal guardian listed in chart but patient denies that he has a legal guardian. ? ? ?Abdominal Pain ? ?Past Medical History:  ?Diagnosis Date  ? ADHD (attention deficit hyperactivity disorder)   ? Alcohol abuse   ? Alcohol withdrawal syndrome without complication ([place])   ? Alcoholic ketoacidosis 0/0/7622  ? Anxiety   ? Asthma   ? Depression   ? suicidal in past  ? History of adenomatous polyp of colon 01/04/2019  ? Right clavicle fracture 11/2018  ? ? ?Patient Active Problem List  ? Diagnosis Date Noted  ? Prolapsed internal hemorrhoids, grade 3 02/02/2019  ? History of adenomatous polyp of colon 01/04/2019  ? Alcohol use disorder, moderate, dependence ([place]) 07/08/2018  ? Alcohol abuse with alcohol-induced mood disorder ([place]) 09/18/2017  ? Major depressive disorder, single episode, severe ([place]) 09/18/2017  ? Substance induced mood disorder ([place]) 09/18/2017  ? ? ?Past Surgical History:  ?Procedure Laterality Date  ? HERNIA REPAIR    ? Performed when 49 months old  ? ? ? ? ? ?Home Medications   ? ?Prior to Admission medications   ?Medication Sig Start Date End Date Taking? Authorizing Provider  ?ondansetron (ZOFRAN-ODT) 4 MG disintegrating tablet Take 1 tablet (4 mg total) by mouth every 8 (eight) hours as needed for nausea or vomiting. 05/27/21  Yes Teodora Medici, FNP  ?albuterol (VENTOLIN HFA) 108 (90 Base) MCG/ACT inhaler Inhale 1-2 puffs into  the lungs every 6 (six) hours as needed for wheezing or shortness of breath. 01/08/21   Teodora Medici, FNP  ?ibuprofen (ADVIL) 800 MG tablet Take 1 tablet (800 mg total) by mouth 3 (three) times daily. 03/03/20   Wieters, Hallie C, PA-C  ? ? ?Family History ?Family History  ?Problem Relation Age of Onset  ? Diabetes Maternal Grandmother   ? Kidney disease Maternal Grandmother   ? Breast cancer Paternal Grandmother   ? Heart disease Other   ? Colon cancer Cousin   ? Esophageal cancer Neg Hx   ? Rectal cancer Neg Hx   ? Stomach cancer Neg Hx   ? ? ?Social History ?Social History  ? ?Tobacco Use  ? Smoking status: Every Day  ?  Packs/day: 1.50  ?  Years: 15.00  ?  Pack years: 22.50  ?  Types: Cigarettes  ? Smokeless tobacco: Never  ?Vaping Use  ? Vaping Use: Never used  ?Substance Use Topics  ? Alcohol use: Yes  ? Drug use: Yes  ?  Frequency: 1.0 times per week  ?  Types: Marijuana  ? ? ? ?Allergies   ?Tape and Tea ? ? ?Review of Systems ?Review of Systems ?Per HPI ? ?Physical Exam ?Triage Vital Signs ?ED Triage Vitals  ?Enc Vitals Group  ?   BP 05/27/21 1256 130/82  ?   Pulse Rate 05/27/21 1256 76  ?   Resp 05/27/21 1256 18  ?  Temp 05/27/21 1256 98.4 ?F (36.9 ?C)  ?   Temp Source 05/27/21 1256 Oral  ?   SpO2 05/27/21 1256 97 %  ?   Weight 05/27/21 1257 135 lb (61.2 kg)  ?   Height 05/27/21 1257 '5\' 11"'$  (1.803 m)  ?   Head Circumference --   ?   Peak Flow --   ?   Pain Score 05/27/21 1257 5  ?   Pain Loc --   ?   Pain Edu? --   ?   Excl. in [place]? --   ? ?No data found. ? ?Updated Vital Signs ?BP 130/82 (BP Location: Right Arm)   Pulse 76   Temp 98.4 ?F (36.9 ?C) (Oral)   Resp 18   Ht '5\' 11"'$  (1.803 m)   Wt 135 lb (61.2 kg)   SpO2 97%   BMI 18.83 kg/m?  ? ?Visual Acuity ?Right Eye Distance:   ?Left Eye Distance:   ?Bilateral Distance:   ? ?Right Eye Near:   ?Left Eye Near:    ?Bilateral Near:    ? ?Physical Exam ?Constitutional:   ?   General: He is not in acute distress. ?   Appearance: Normal appearance. He is not  toxic-appearing or diaphoretic.  ?HENT:  ?   Head: Normocephalic and atraumatic.  ?   Mouth/Throat:  ?   Mouth: Mucous membranes are moist.  ?   Pharynx: No posterior oropharyngeal erythema.  ?Eyes:  ?   Extraocular Movements: Extraocular movements intact.  ?   Conjunctiva/sclera: Conjunctivae normal.  ?Cardiovascular:  ?   Rate and Rhythm: Normal rate and regular rhythm.  ?   Pulses: Normal pulses.  ?   Heart sounds: Normal heart sounds.  ?Pulmonary:  ?   Effort: Pulmonary effort is normal. No respiratory distress.  ?   Breath sounds: Normal breath sounds.  ?Abdominal:  ?   General: Abdomen is flat. Bowel sounds are normal. There is no distension.  ?   Palpations: Abdomen is soft.  ?   Tenderness: There is no abdominal tenderness.  ?Neurological:  ?   General: No focal deficit present.  ?   Mental Status: He is alert and oriented to person, place, and time. Mental status is at baseline.  ?Psychiatric:     ?   Mood and Affect: Mood normal.     ?   Behavior: Behavior normal.     ?   Thought Content: Thought content normal.     ?   Judgment: Judgment normal.  ? ? ? ?UC Treatments / Results  ?Labs ?(all labs ordered are listed, but only abnormal results are displayed) ?Labs Reviewed - No data to display ? ?EKG ? ? ?Radiology ?No results found. ? ?Procedures ?Procedures (including critical care time) ? ?Medications Ordered in UC ?Medications - No data to display ? ?Initial Impression / Assessment and Plan / UC Course  ?I have reviewed the triage vital signs and the nursing notes. ? ?Pertinent labs & imaging results that were available during my care of the patient were reviewed by me and considered in my medical decision making (see chart for details). ? ?  ? ?Symptoms and physical exam consistent with viral illness.  Ondansetron prescribed to take as needed for nausea.  No signs of dehydration on exam and the patient was encouraged to ensure adequate fluid hydration.  Discussed return and ER precautions.  Patient  verbalized understanding and was agreeable with plan. ?Final Clinical Impressions(s) / UC Diagnoses  ? ?  Final diagnoses:  ?Viral gastroenteritis  ? ? ? ?Discharge Instructions   ? ?  ?It appears that you have a viral stomach virus. You have been prescribed a nausea medication to take as needed. Please ensure adequate fluid hydration.  ? ? ? ?ED Prescriptions   ? ? Medication Sig Dispense Auth. Provider  ? ondansetron (ZOFRAN-ODT) 4 MG disintegrating tablet Take 1 tablet (4 mg total) by mouth every 8 (eight) hours as needed for nausea or vomiting. 20 tablet Oswaldo Conroy E, [place]  ? ?  ? ?PDMP not reviewed this encounter. ?  ?Teodora Medici, [place] ?05/27/21 1315 ? ?

## 2021-08-03 ENCOUNTER — Emergency Department (HOSPITAL_COMMUNITY): Payer: Self-pay

## 2021-08-03 ENCOUNTER — Emergency Department (HOSPITAL_BASED_OUTPATIENT_CLINIC_OR_DEPARTMENT_OTHER): Payer: Self-pay

## 2021-08-03 ENCOUNTER — Other Ambulatory Visit: Payer: Self-pay

## 2021-08-03 ENCOUNTER — Inpatient Hospital Stay (HOSPITAL_COMMUNITY)
Admission: EM | Admit: 2021-08-03 | Discharge: 2021-08-07 | DRG: 270 | Disposition: A | Discharge status: Home / Self Care | Payer: Self-pay | Attending: Internal Medicine | Admitting: Internal Medicine

## 2021-08-03 ENCOUNTER — Encounter (HOSPITAL_COMMUNITY): Payer: Self-pay | Admitting: Emergency Medicine

## 2021-08-03 DIAGNOSIS — E861 Hypovolemia: Secondary | ICD-10-CM | POA: Diagnosis present

## 2021-08-03 DIAGNOSIS — F17210 Nicotine dependence, cigarettes, uncomplicated: Secondary | ICD-10-CM | POA: Diagnosis present

## 2021-08-03 DIAGNOSIS — D509 Iron deficiency anemia, unspecified: Secondary | ICD-10-CM | POA: Diagnosis present

## 2021-08-03 DIAGNOSIS — F1994 Other psychoactive substance use, unspecified with psychoactive substance-induced mood disorder: Secondary | ICD-10-CM | POA: Diagnosis present

## 2021-08-03 DIAGNOSIS — I82422 Acute embolism and thrombosis of left iliac vein: Secondary | ICD-10-CM | POA: Diagnosis present

## 2021-08-03 DIAGNOSIS — I82401 Acute embolism and thrombosis of unspecified deep veins of right lower extremity: Secondary | ICD-10-CM

## 2021-08-03 DIAGNOSIS — E871 Hypo-osmolality and hyponatremia: Secondary | ICD-10-CM | POA: Diagnosis present

## 2021-08-03 DIAGNOSIS — I82409 Acute embolism and thrombosis of unspecified deep veins of unspecified lower extremity: Secondary | ICD-10-CM

## 2021-08-03 DIAGNOSIS — J45909 Unspecified asthma, uncomplicated: Secondary | ICD-10-CM | POA: Diagnosis present

## 2021-08-03 DIAGNOSIS — Z560 Unemployment, unspecified: Secondary | ICD-10-CM

## 2021-08-03 DIAGNOSIS — F329 Major depressive disorder, single episode, unspecified: Secondary | ICD-10-CM | POA: Diagnosis present

## 2021-08-03 DIAGNOSIS — F419 Anxiety disorder, unspecified: Secondary | ICD-10-CM | POA: Diagnosis present

## 2021-08-03 DIAGNOSIS — Z681 Body mass index (BMI) 19 or less, adult: Secondary | ICD-10-CM

## 2021-08-03 DIAGNOSIS — E538 Deficiency of other specified B group vitamins: Secondary | ICD-10-CM | POA: Diagnosis present

## 2021-08-03 DIAGNOSIS — Z86010 Personal history of colonic polyps: Secondary | ICD-10-CM

## 2021-08-03 DIAGNOSIS — D649 Anemia, unspecified: Secondary | ICD-10-CM

## 2021-08-03 DIAGNOSIS — F909 Attention-deficit hyperactivity disorder, unspecified type: Secondary | ICD-10-CM | POA: Diagnosis present

## 2021-08-03 DIAGNOSIS — E440 Moderate protein-calorie malnutrition: Secondary | ICD-10-CM | POA: Diagnosis present

## 2021-08-03 DIAGNOSIS — I82402 Acute embolism and thrombosis of unspecified deep veins of left lower extremity: Principal | ICD-10-CM

## 2021-08-03 DIAGNOSIS — I2699 Other pulmonary embolism without acute cor pulmonale: Secondary | ICD-10-CM | POA: Diagnosis present

## 2021-08-03 DIAGNOSIS — I82412 Acute embolism and thrombosis of left femoral vein: Principal | ICD-10-CM | POA: Diagnosis present

## 2021-08-03 DIAGNOSIS — Z91018 Allergy to other foods: Secondary | ICD-10-CM

## 2021-08-03 DIAGNOSIS — M79605 Pain in left leg: Secondary | ICD-10-CM

## 2021-08-03 DIAGNOSIS — I82442 Acute embolism and thrombosis of left tibial vein: Secondary | ICD-10-CM | POA: Diagnosis present

## 2021-08-03 DIAGNOSIS — Z91048 Other nonmedicinal substance allergy status: Secondary | ICD-10-CM

## 2021-08-03 DIAGNOSIS — E46 Unspecified protein-calorie malnutrition: Secondary | ICD-10-CM

## 2021-08-03 DIAGNOSIS — F1014 Alcohol abuse with alcohol-induced mood disorder: Secondary | ICD-10-CM | POA: Diagnosis present

## 2021-08-03 DIAGNOSIS — K625 Hemorrhage of anus and rectum: Secondary | ICD-10-CM | POA: Diagnosis present

## 2021-08-03 LAB — CBC WITH DIFFERENTIAL/PLATELET
Abs Immature Granulocytes: 0.07 10*3/uL (ref 0.00–0.07)
Basophils Absolute: 0 10*3/uL (ref 0.0–0.1)
Basophils Relative: 0 %
Eosinophils Absolute: 0.1 10*3/uL (ref 0.0–0.5)
Eosinophils Relative: 1 %
HCT: 31.8 % — ABNORMAL LOW (ref 39.0–52.0)
Hemoglobin: 8.6 g/dL — ABNORMAL LOW (ref 13.0–17.0)
Immature Granulocytes: 1 %
Lymphocytes Relative: 11 %
Lymphs Abs: 1.6 10*3/uL (ref 0.7–4.0)
MCH: 19.4 pg — ABNORMAL LOW (ref 26.0–34.0)
MCHC: 27 g/dL — ABNORMAL LOW (ref 30.0–36.0)
MCV: 71.6 fL — ABNORMAL LOW (ref 80.0–100.0)
Monocytes Absolute: 1.3 10*3/uL — ABNORMAL HIGH (ref 0.1–1.0)
Monocytes Relative: 9 %
Neutro Abs: 11.6 10*3/uL — ABNORMAL HIGH (ref 1.7–7.7)
Neutrophils Relative %: 78 %
Platelets: 448 10*3/uL — ABNORMAL HIGH (ref 150–400)
RBC: 4.44 MIL/uL (ref 4.22–5.81)
RDW: 18.2 % — ABNORMAL HIGH (ref 11.5–15.5)
WBC: 14.6 10*3/uL — ABNORMAL HIGH (ref 4.0–10.5)
nRBC: 0 % (ref 0.0–0.2)

## 2021-08-03 LAB — URINALYSIS, ROUTINE W REFLEX MICROSCOPIC
Bilirubin Urine: NEGATIVE
Glucose, UA: NEGATIVE mg/dL
Hgb urine dipstick: NEGATIVE
Ketones, ur: 80 mg/dL — AB
Leukocytes,Ua: NEGATIVE
Nitrite: NEGATIVE
Protein, ur: NEGATIVE mg/dL
Specific Gravity, Urine: 1.036 — ABNORMAL HIGH (ref 1.005–1.030)
pH: 5 (ref 5.0–8.0)

## 2021-08-03 LAB — COMPREHENSIVE METABOLIC PANEL
ALT: 10 U/L (ref 0–44)
AST: 14 U/L — ABNORMAL LOW (ref 15–41)
Albumin: 3.4 g/dL — ABNORMAL LOW (ref 3.5–5.0)
Alkaline Phosphatase: 67 U/L (ref 38–126)
Anion gap: 12 (ref 5–15)
BUN: 8 mg/dL (ref 6–20)
CO2: 20 mmol/L — ABNORMAL LOW (ref 22–32)
Calcium: 8.9 mg/dL (ref 8.9–10.3)
Chloride: 100 mmol/L (ref 98–111)
Creatinine, Ser: 0.69 mg/dL (ref 0.61–1.24)
GFR, Estimated: >60 mL/min (ref >60)
Glucose, Bld: 105 mg/dL — ABNORMAL HIGH (ref 70–99)
Potassium: 3.8 mmol/L (ref 3.5–5.1)
Sodium: 132 mmol/L — ABNORMAL LOW (ref 135–145)
Total Bilirubin: 0.8 mg/dL (ref 0.3–1.2)
Total Protein: 7.6 g/dL (ref 6.5–8.1)

## 2021-08-03 LAB — LACTIC ACID, PLASMA
Lactic Acid, Venous: 1.2 mmol/L (ref 0.5–1.9)
Lactic Acid, Venous: 1 mmol/L (ref 0.5–1.9)

## 2021-08-03 MED ORDER — LORAZEPAM 1 MG PO TABS: 0.0000 mg | ORAL_TABLET | Freq: Four times a day (QID) | ORAL | Status: AC

## 2021-08-03 MED ORDER — LORAZEPAM 1 MG PO TABS
0.0000 mg | ORAL_TABLET | Freq: Two times a day (BID) | ORAL | Status: DC
  Filled 2021-08-03: qty 4

## 2021-08-03 MED ORDER — MELATONIN 5 MG PO TABS
5.0000 mg | ORAL_TABLET | Freq: Every evening | ORAL | Status: DC | PRN
  Administered 2021-08-04 – 2021-08-05 (×2): 5 mg via ORAL
  Filled 2021-08-03 (×2): qty 1

## 2021-08-03 MED ORDER — LACTATED RINGERS IV BOLUS
1000.0000 mL | Freq: Once | INTRAVENOUS | Status: AC
  Administered 2021-08-03: 1000 mL via INTRAVENOUS

## 2021-08-03 MED ORDER — THIAMINE HCL 100 MG/ML IJ SOLN
100.0000 mg | Freq: Every day | INTRAMUSCULAR | Status: DC
  Administered 2021-08-03 – 2021-08-04 (×2): 100 mg via INTRAVENOUS
  Filled 2021-08-03 (×2): qty 2

## 2021-08-03 MED ORDER — OXYCODONE HCL 5 MG PO TABS
5.0000 mg | ORAL_TABLET | Freq: Four times a day (QID) | ORAL | Status: DC | PRN
  Administered 2021-08-04 – 2021-08-07 (×11): 5 mg via ORAL
  Filled 2021-08-03 (×11): qty 1

## 2021-08-03 MED ORDER — ACETAMINOPHEN 500 MG PO TABS
1000.0000 mg | ORAL_TABLET | Freq: Four times a day (QID) | ORAL | Status: DC | PRN
  Administered 2021-08-03 – 2021-08-05 (×3): 1000 mg via ORAL
  Filled 2021-08-03 (×3): qty 2

## 2021-08-03 MED ORDER — HEPARIN BOLUS VIA INFUSION
4000.0000 [IU] | Freq: Once | INTRAVENOUS | Status: AC
  Administered 2021-08-03: 4000 [IU] via INTRAVENOUS
  Filled 2021-08-03: qty 4000

## 2021-08-03 MED ORDER — HYDROMORPHONE HCL 1 MG/ML IJ SOLN
0.5000 mg | INTRAMUSCULAR | Status: DC | PRN
  Administered 2021-08-04 – 2021-08-07 (×14): 0.5 mg via INTRAVENOUS
  Filled 2021-08-03 (×4): qty 0.5
  Filled 2021-08-03: qty 1
  Filled 2021-08-03 (×4): qty 0.5
  Filled 2021-08-03: qty 1
  Filled 2021-08-03 (×3): qty 0.5

## 2021-08-03 MED ORDER — LORAZEPAM 2 MG/ML IJ SOLN
0.0000 mg | Freq: Four times a day (QID) | INTRAMUSCULAR | Status: AC
  Administered 2021-08-03: 1 mg via INTRAVENOUS
  Filled 2021-08-03: qty 1

## 2021-08-03 MED ORDER — LORAZEPAM 2 MG/ML IJ SOLN: 0.0000 mg | Freq: Two times a day (BID) | INTRAMUSCULAR | Status: DC

## 2021-08-03 MED ORDER — SODIUM CHLORIDE 0.9 % IV SOLN: INTRAVENOUS | Status: AC

## 2021-08-03 MED ORDER — POLYETHYLENE GLYCOL 3350 17 G PO PACK: 17.0000 g | PACK | Freq: Every day | ORAL | Status: DC | PRN

## 2021-08-03 MED ORDER — THIAMINE HCL 100 MG PO TABS
100.0000 mg | ORAL_TABLET | Freq: Every day | ORAL | Status: DC
  Administered 2021-08-05 – 2021-08-07 (×3): 100 mg via ORAL
  Filled 2021-08-03 (×4): qty 1

## 2021-08-03 MED ORDER — IOHEXOL 350 MG/ML SOLN
95.0000 mL | Freq: Once | INTRAVENOUS | Status: AC | PRN
Start: 2021-08-03 — End: 2021-08-03
  Administered 2021-08-03: 95 mL via INTRAVENOUS

## 2021-08-03 MED ORDER — HEPARIN (PORCINE) 25000 UT/250ML-% IV SOLN
1900.0000 [IU]/h | INTRAVENOUS | Status: DC
  Administered 2021-08-03: 1000 [IU]/h via INTRAVENOUS
  Administered 2021-08-04: 1300 [IU]/h via INTRAVENOUS
  Administered 2021-08-05 – 2021-08-07 (×4): 1700 [IU]/h via INTRAVENOUS
  Filled 2021-08-03 (×6): qty 250

## 2021-08-03 NOTE — Progress Notes (Signed)
Lower extremity venous left and IVC/iliac studies completed.   Preliminary results relayed to [place], [place] in ED.  See CV Proc for preliminary results report.   Darlin Coco, RDMS, RVT

## 2021-08-03 NOTE — ED Triage Notes (Signed)
Patient complains of shoot pain in left leg that started four days ago. Patient states he was laying in bed when he rolled over, heard a pop in his left thigh, then a few hours later started having pain that radiated from the site of the "popping" sensation down his leg. Patient is alert, oriented, and in no apparent distress at this time.

## 2021-08-03 NOTE — ED Provider Notes (Signed)
[facility] EMERGENCY DEPARTMENT Provider Note   CSN: [id number] Arrival date & time: 08/03/21  1606     History  Chief Complaint  Patient presents with   Leg Pain    Landers Prajapati is a 31 y.o. male.  HPI  Patient is a 31 year old male with history of alcohol abuse, major depressive disorder, who presents to the emergency department due to atraumatic left leg pain.  States that about 2 weeks ago he was lying in bed and felt a pop and began experiencing dull pain in the left groin and upper leg.  States his symptoms would wax and wane for the past 2 weeks.  About 3 to 4 days ago he was lying in bed at night and had a similar episode but now is having severe pain that radiates from the groin down the left posterior leg.  Reports an episode of swelling in the left calf about 2 days ago that he applied ice to and has since resolved.  Denies any fevers, nausea, vomiting, diarrhea.  Does note that he drinks about 5-6 alcoholic beverages daily and has not drank for about 2 days.  Denies a history of DTs in the past.  He smokes marijuana but otherwise denies any other illicit drug use.  Denies any IVDA.  Patient and his grandmother at bedside deny any known history of familial hypercoagulability.  He does note that he lost his job about 3 weeks ago and has been sitting most of the day every day for the past 3 weeks.     Home Medications Prior to Admission medications   Medication Sig Start Date End Date Taking? Authorizing Provider  albuterol (VENTOLIN HFA) 108 (90 Base) MCG/ACT inhaler Inhale 1-2 puffs into the lungs every 6 (six) hours as needed for wheezing or shortness of breath. 01/08/21   Teodora Medici, FNP  ibuprofen (ADVIL) 800 MG tablet Take 1 tablet (800 mg total) by mouth 3 (three) times daily. 03/03/20   Wieters, Hallie C, PA-C  ondansetron (ZOFRAN-ODT) 4 MG disintegrating tablet Take 1 tablet (4 mg total) by mouth every 8 (eight) hours as needed for nausea or  vomiting. 05/27/21   Teodora Medici, FNP      Allergies    Tape and Tea    Review of Systems   Review of Systems  All other systems reviewed and are negative. Ten systems reviewed and are negative for acute change, except as noted in the HPI.   Physical Exam Updated Vital Signs BP 93/78   Pulse (!) 104   Temp 98.6 F (37 C) (Oral)   Resp 17   SpO2 98%  Physical Exam Vitals and nursing note reviewed.  Constitutional:      General: He is not in acute distress.    Appearance: Normal appearance. He is not ill-appearing, toxic-appearing or diaphoretic.  HENT:     Head: Normocephalic and atraumatic.     Right Ear: External ear normal.     Left Ear: External ear normal.     Nose: Nose normal.     Mouth/Throat:     Mouth: Mucous membranes are moist.     Pharynx: Oropharynx is clear. No oropharyngeal exudate or posterior oropharyngeal erythema.  Eyes:     Extraocular Movements: Extraocular movements intact.  Cardiovascular:     Rate and Rhythm: Regular rhythm. Tachycardia present.     Pulses: Normal pulses.     Heart sounds: Normal heart sounds. No murmur heard.   No  friction rub. No gallop.  Pulmonary:     Effort: Pulmonary effort is normal. No respiratory distress.     Breath sounds: Normal breath sounds. No stridor. No wheezing, rhonchi or rales.  Abdominal:     General: Abdomen is flat.     Palpations: Abdomen is soft.     Tenderness: There is no abdominal tenderness.  Musculoskeletal:        General: Tenderness present. Normal range of motion.     Cervical back: Normal range of motion and neck supple. No tenderness.     Comments: Moderate TTP noted in the left leg from the left inguinal region spanning the left medial thigh, popliteal fossa, and left calf.  No significant lower extremity edema noted.  Palpable pedal pulses.  Distal sensation intact.  Skin:    General: Skin is warm and dry.  Neurological:     General: No focal deficit present.     Mental Status: He is  alert and oriented to person, place, and time.  Psychiatric:        Mood and Affect: Mood normal.        Behavior: Behavior normal.   ED Results / Procedures / Treatments   Labs (all labs ordered are listed, but only abnormal results are displayed) Labs Reviewed  CBC WITH DIFFERENTIAL/PLATELET - Abnormal; Notable for the following components:      Result Value   WBC 14.6 (*)    Hemoglobin 8.6 (*)    HCT 31.8 (*)    MCV 71.6 (*)    MCH 19.4 (*)    MCHC 27.0 (*)    RDW 18.2 (*)    Platelets 448 (*)    Neutro Abs 11.6 (*)    Monocytes Absolute 1.3 (*)    All other components within normal limits  COMPREHENSIVE METABOLIC PANEL - Abnormal; Notable for the following components:   Sodium 132 (*)    CO2 20 (*)    Glucose, Bld 105 (*)    Albumin 3.4 (*)    AST 14 (*)    All other components within normal limits  CULTURE, BLOOD (ROUTINE X 2)  CULTURE, BLOOD (ROUTINE X 2)  LACTIC ACID, PLASMA  LACTIC ACID, PLASMA  HEPARIN LEVEL (UNFRACTIONATED)  CBC  URINALYSIS, ROUTINE W REFLEX MICROSCOPIC   EKG None  Radiology CT Angio Chest PE W and/or Wo Contrast  Addendum Date: 08/03/2021   ADDENDUM REPORT: 08/03/2021 21:26 ADDENDUM: Critical findings were reported to Dr. Rayna Sexton at 9:25 p.m. Electronically Signed   By: Brett Fairy M.D.   On: 08/03/2021 21:26   Result Date: 08/03/2021 CLINICAL DATA:  Pulmonary embolism suspected, high probability. Tachycardia. EXAM: CT ANGIOGRAPHY CHEST WITH CONTRAST TECHNIQUE: Multidetector CT imaging of the chest was performed using the standard protocol during bolus administration of intravenous contrast. Multiplanar CT image reconstructions and MIPs were obtained to evaluate the vascular anatomy. RADIATION DOSE REDUCTION: This exam was performed according to the departmental dose-optimization program which includes automated exposure control, adjustment of the mA and/or kV according to patient size and/or use of iterative reconstruction technique.  CONTRAST:  79m OMNIPAQUE IOHEXOL 350 MG/ML SOLN COMPARISON:  None Available. FINDINGS: Cardiovascular: Heart is normal in size and there is no pericardial effusion. The aorta and pulmonary trunk are normal in caliber. There are lobar, segmental, and subsegmental pulmonary artery filling defects bilaterally with moderate to large emboli burden. No evidence of right heart strain. Mediastinum/Nodes: No enlarged mediastinal, hilar, or axillary lymph nodes. Thyroid gland, trachea, and esophagus  demonstrate no significant findings. Lungs/Pleura: Minimal apical pleural scarring is noted bilaterally. No consolidation, effusion, or pneumothorax. Upper Abdomen: No acute abnormality. Musculoskeletal: No acute osseous abnormality. Review of the MIP images confirms the above findings. IMPRESSION: 1. Bilateral lobar, segmental, and subsegmental pulmonary emboli with moderate-to-large thrombus burden. No evidence of right heart strain. 2. No acute infiltrate in the lungs. Electronically Signed: By: Brett Fairy M.D. On: 08/03/2021 21:21   VAS [place] IVC/ILIAC (VENOUS ONLY)  Result Date: 08/03/2021 IVC/ILIAC STUDY Patient Name:  APOLONIO CUTTING  Date of Exam:   08/03/2021 Medical Rec #: [id number]         Accession #:    [id number] Date of Birth: 12-Aug-1990          Patient Gender: Patient Age:   74 years Exam Location:  [facility] Procedure:      VAS [place] IVC/ILIAC (VENOUS ONLY) Referring Phys: --------------------------------------------------------------------------------  Indications: Extensive left lower extremity DVT with extension above inguinal              ligament Limitations: Air/bowel gas.  Comparison Study: 08-03-2021 Left lower extremity venous study. Performing Technologist: Darlin Coco RDMS, RVT  Examination Guidelines: A complete evaluation includes B-mode imaging, spectral Doppler, color Doppler, and power Doppler as needed of all accessible portions of each vessel. Bilateral testing is considered an integral  part of a complete examination. Limited examinations for reoccurring indications may be performed as noted.  IVC/Iliac Findings: +----------+------+--------+--------+    IVC    PatentThrombusComments +----------+------+--------+--------+ IVC Mid   patent                 +----------+------+--------+--------+ IVC Distalpatent                 +----------+------+--------+--------+  +-------------------+---------+-----------+---------+-----------+--------+         CIV        RT-PatentRT-ThrombusLT-PatentLT-ThrombusComments +-------------------+---------+-----------+---------+-----------+--------+ Common Iliac Prox   patent                         acute            +-------------------+---------+-----------+---------+-----------+--------+ Common Iliac Mid    patent                         acute            +-------------------+---------+-----------+---------+-----------+--------+ Common Iliac Distal patent                         acute            +-------------------+---------+-----------+---------+-----------+--------+  +-------------------------+---------+-----------+---------+-----------+--------+            EIV           RT-PatentRT-ThrombusLT-PatentLT-ThrombusComments +-------------------------+---------+-----------+---------+-----------+--------+ External Iliac Vein Prox  patent                         acute            +-------------------------+---------+-----------+---------+-----------+--------+ External Iliac Vein Mid   patent                         acute            +-------------------------+---------+-----------+---------+-----------+--------+ External Iliac Vein       patent                         acute  Distal                                                                    +-------------------------+---------+-----------+---------+-----------+--------+   Summary: IVC/Iliac: There is no evidence of thrombus involving  the IVC. There is no evidence of thrombus involving the right common iliac vein. There is evidence of acute thrombus involving the left common iliac vein. There is no evidence of thrombus involving the right external iliac vein. There is evidence of acute thrombus involving the left external iliac vein. Visualization of proximal Inferior Vena Cava was limited.  *See table(s) above for measurements and observations.  Electronically signed by Orlie Pollen on 08/03/2021 at 7:09:53 PM.    Final    DG Hip Unilat W or Wo Pelvis 2-3 Views Left  Result Date: 08/03/2021 CLINICAL DATA:  Left hip pain EXAM: DG HIP (WITH OR WITHOUT PELVIS) 2-3V LEFT COMPARISON:  None Available. FINDINGS: There is no evidence of hip fracture or dislocation. There is no evidence of arthropathy or other focal bone abnormality. IMPRESSION: Negative. Electronically Signed   By: Davina Poke D.O.   On: 08/03/2021 17:22   VAS [place] LOWER EXTREMITY VENOUS (DVT) (ONLY [facility] & [facility])  Result Date: 08/03/2021  Lower Venous DVT Study Patient Name:  TERUO STILLEY  Date of Exam:   08/03/2021 Medical Rec #: [id number]         Accession #:    [id number] Date of Birth: [birth year]          Patient Gender: M Patient Age:   37 years Exam Location:  [facility] Procedure:      VAS [place] LOWER EXTREMITY VENOUS (DVT) Referring Phys: LESLIE SOFIA --------------------------------------------------------------------------------  Indications: Pain left leg.  Comparison Study: No prior studies. Performing Technologist: Darlin Coco RDMS, RVT  Examination Guidelines: A complete evaluation includes B-mode imaging, spectral Doppler, color Doppler, and power Doppler as needed of all accessible portions of each vessel. Bilateral testing is considered an integral part of a complete examination. Limited examinations for reoccurring indications may be performed as noted. The reflux portion of the exam is performed with the patient in reverse Trendelenburg.   +-----+---------------+---------+-----------+----------+--------------+ RIGHTCompressibilityPhasicitySpontaneityPropertiesThrombus Aging +-----+---------------+---------+-----------+----------+--------------+ CFV  Full           Yes      Yes                                 +-----+---------------+---------+-----------+----------+--------------+   +---------+---------------+---------+-----------+----------+--------------+ LEFT     CompressibilityPhasicitySpontaneityPropertiesThrombus Aging +---------+---------------+---------+-----------+----------+--------------+ CFV      None           No       No                   Acute          +---------+---------------+---------+-----------+----------+--------------+ SFJ      None           No       No                   Acute          +---------+---------------+---------+-----------+----------+--------------+ FV Prox  None  No       No                   Acute          +---------+---------------+---------+-----------+----------+--------------+ FV Mid   None           No       No                   Acute          +---------+---------------+---------+-----------+----------+--------------+ FV DistalNone           No       No                   Acute          +---------+---------------+---------+-----------+----------+--------------+ PFV      None           No       No                   Acute          +---------+---------------+---------+-----------+----------+--------------+ POP      None           No       No                   Acute          +---------+---------------+---------+-----------+----------+--------------+ PTV      None           No       No                   Acute          +---------+---------------+---------+-----------+----------+--------------+ PERO     None           No       No                   Acute           +---------+---------------+---------+-----------+----------+--------------+ Gastroc  None           No       No                   Acute          +---------+---------------+---------+-----------+----------+--------------+     Summary: RIGHT: - No evidence of common femoral vein obstruction.  LEFT: - Findings consistent with acute deep vein thrombosis involving the left common femoral vein, SF junction, left femoral vein, left proximal profunda vein, left popliteal vein, left posterior tibial veins, left peroneal veins, and left gastrocnemius veins. - No cystic structure found in the popliteal fossa. - Possible obstruction proximal to the inguinal ligament.  *See table(s) above for measurements and observations. Electronically signed by Orlie Pollen on 08/03/2021 at 7:09:29 PM.    Final     Procedures Procedures   Medications Ordered in ED Medications  LORazepam (ATIVAN) injection 0-4 mg (2 mg Intravenous Given 08/03/21 2018)    Or  LORazepam (ATIVAN) tablet 0-4 mg ( Oral See Alternative 08/03/21 2018)  LORazepam (ATIVAN) injection 0-4 mg (has no administration in time range)    Or  LORazepam (ATIVAN) tablet 0-4 mg (has no administration in time range)  thiamine tablet 100 mg ( Oral See Alternative 08/03/21 2017)    Or  thiamine (B-1) injection 100 mg (100 mg Intravenous Given 08/03/21 2017)  acetaminophen (TYLENOL) tablet 1,000 mg (1,000 mg  Oral Given 08/03/21 2014)  heparin ADULT infusion 100 units/mL (25000 units/238m) (1,000 Units/hr Intravenous New Bag/Given 08/03/21 2022)  heparin bolus via infusion 4,000 Units (4,000 Units Intravenous Bolus from Bag 08/03/21 2023)  lactated ringers bolus 1,000 mL (1,000 mLs Intravenous New Bag/Given 08/03/21 2015)  iohexol (OMNIPAQUE) 350 MG/ML injection 95 mL (95 mLs Intravenous Contrast Given 08/03/21 2045)   ED Course/ Medical Decision Making/ A&P Clinical Course as of 08/03/21 2154  Mon Aug 03, 2021  1919 Patient discussed with Dr. RVirl Cageywith vascular  surgery.  He has evaluated the patient.  Agrees with work-up thus far.  Also recommend CT venogram of the abdomen/pelvis.  Request that we heparinized the patient and admit to medicine.  N.p.o. after midnight.  Patient will likely be taken to the OR tomorrow. [LJ]    Clinical Course User Index [LJ] JRayna Sexton PA-C                           Medical Decision Making Amount and/or Complexity of Data Reviewed Labs: ordered. Radiology: ordered.  Risk OTC drugs. Prescription drug management. Decision regarding hospitalization.   Pt is a 31y.o. male who presents to the emergency department due to left leg pain.  Labs: CBC with a white count of 14.6, hemoglobin of 8.6, hematocrit of 31.8, MCV of 71.6, RDW of 18.2, platelets of 448, neutrophils 11.6, monocytes of 1.3. CMP with a sodium of 132, CO2 20, glucose of 105, BUN of 3.4, AST of 14. Lactic acid 1.2. Blood cultures obtained.  Imaging: DVT ultrasound of the left leg shows Summary: RIGHT: - No evidence of common femoral vein obstruction.  LEFT: - Findings consistent with acute deep vein thrombosis involving the left common femoral vein, SF junction, left femoral vein, left proximal profunda vein, left popliteal vein, left posterior tibial veins, left peroneal veins, and left gastrocnemius veins. - No cystic structure found in the popliteal fossa. - Possible obstruction proximal to the inguinal ligament.  CT of the chest shows IMPRESSION: 1. Bilateral lobar, segmental, and subsegmental pulmonary emboli with moderate-to-large thrombus burden. No evidence of right heart strain. 2. No acute infiltrate in the lungs.   X-ray of the left hip is negative.  Vascular ultrasound of the IVC/iliac shows  Summary: IVC/Iliac: There is no evidence of thrombus involving the IVC. There is no evidence of thrombus involving the right common iliac vein. There is evidence of acute thrombus involving the left common iliac vein. There is no evidence of  thrombus involving the right external iliac vein. There is evidence of acute thrombus involving the left external iliac vein. Visualization of proximal Inferior Vena Cava was limited.   I, LRayna Sexton PA-C, personally reviewed and evaluated these images and lab results as part of my medical decision-making.  Physical exam significant for temperature of 101.9 F rectally.  Tachycardic around 105 bpm.  Patient with palpable tenderness along the left posterior leg from the groin to the calf.  No significant edema noted in the left leg.  Venous ultrasound with findings as noted above.  Appears concerning for DVT in the left leg.  Given his elevated temperature and tachycardia also obtained a CTA of the chest which is also concerning for PE.  Please see CT scan above for additional information.  No evidence of right heart strain.  On reassessment patient is lying comfortably in bed.  Still reports pain to the left leg but otherwise denies any chest pain or shortness  of breath.  Fever has improved to 98.6 F.  Still mildly tachycardic around 105 bpm.  Normotensive.  Not hypoxic.  Patient discussed with and evaluated by Dr. Virl Cagey with vascular surgery.  Recommends medicine admission.  Recommends starting patient on heparin.  N.p.o. after midnight.  He will likely take patient to the OR tomorrow.  This was discussed with the patient and he is amenable with this plan.  We will discuss with the medicine team.  Note: Portions of this report may have been transcribed using voice recognition software. Every effort was made to ensure accuracy; however, inadvertent computerized transcription errors may be present.   Final Clinical Impression(s) / ED Diagnoses Final diagnoses:  Acute deep vein thrombosis (DVT) of left lower extremity, unspecified vein (HCC)  Anemia, unspecified type  Other acute pulmonary embolism without acute cor pulmonale [facility])   Rx / DC Orders ED Discharge Orders     None          Rayna Sexton, PA-C 38/45/36 [id number]    Campbell Stall P, DO 32/12/24 1307

## 2021-08-03 NOTE — ED Provider Triage Note (Signed)
Emergency Medicine Provider Triage Evaluation Note  Mark Mcdaniel , a 31 y.o. male  was evaluated in triage.  Pt complains of left leg pain   Review of Systems  Positive: Pain left leg Negative: No fever  Physical Exam  BP 122/82 (BP Location: Right Arm)   Pulse (!) 120   Temp 99.4 F (37.4 C) (Oral)   Resp (!) 22   SpO2 100%  Gen:   Awake, no distress   Resp:  Normal effort  MSK:   Pain with moving left leg Other:    Medical Decision Making  Medically screening exam initiated at 4:57 PM.  Appropriate orders placed.  Mark Mcdaniel was informed that the remainder of the evaluation will be completed by another provider, this initial triage assessment does not replace that evaluation, and the importance of remaining in the ED until their evaluation is complete.     Fransico Meadow, [place] 08/03/21 1705

## 2021-08-03 NOTE — H&P (Signed)
History and Physical  Mark Mcdaniel ION:[id number] DOB: 02-04-1991 DOA: 08/03/2021  Referring physician: Dr. Vernia Buff, [place]  PCP: Enid Skeens., MD  Outpatient Specialists: None Patient coming from: Home.  Chief Complaint: Severe atraumatic left lower extremity pain.  HPI: Mark Mcdaniel is a 31 y.o. male with medical history significant for polysubstance abuse including cocaine, THC, tobacco, alcohol use disorder who presented to [facility] ED from home with complaints of severe left lower extremity pain x2 days.  No trauma.  No recent trips.  The patient endorses he was laid off from his job 2 weeks ago.  Since then, he has been laying in bed most of the time, with minimal activity.  He drinks 5-6 beers daily, also smokes daily.  Endorses swelling of the left calf about 2 days ago which resolved after application of ice and elevation of the leg. He thought he pulled a muscle that's why he did not present to the ED sooner.    About 2 weeks ago he had pain on the left side of his back radiating to his LLE.  After icing and conservatively management, the pain resolved.  No family history of hypercoagulable state.  No prior history of DVT.  He presented to the ED for further evaluation.    In the ED, work-up revealed extensive left lower extremity DVT seen on Doppler ultrasound.  Also reveals bilateral pulmonary embolism with no evidence of right heart strain seen on CT scan.  The patient was started on heparin drip.  EDP discussed the case with vascular surgery who recommended to continue heparin drip and n.p.o. after midnight for possible thrombectomy.  TRH, hospitalist service, was asked to admit.  ED Course: Tmax 101.9.  BP 112/73, pulse 93, respiration rate 21, O2 saturation 96% on room air.  Lab studies remarkable for serum sodium 132, serum bicarb 20, glucose 205, albumin 3.4, AST 14.  WBC 14.6, hemoglobin 8.6, MCV 71.6, platelet count 448.  Review of Systems: Review of systems as noted in  the HPI. All other systems reviewed and are negative.   Past Medical History:  Diagnosis Date   ADHD (attention deficit hyperactivity disorder)    Alcohol abuse    Alcohol withdrawal syndrome without complication (HCC)    Alcoholic ketoacidosis 04/04/4008   Anxiety    Asthma    Depression    suicidal in past   History of adenomatous polyp of colon 01/04/2019   Right clavicle fracture 11/2018   Past Surgical History:  Procedure Laterality Date   HERNIA REPAIR     Performed when 66 months old    Social History:  reports that he has been smoking cigarettes. He has a 22.50 pack-year smoking history. He has never used smokeless tobacco. He reports current alcohol use. He reports current drug use. Frequency: 1.00 time per week. Drug: Marijuana.   Allergies  Allergen Reactions   Tape Other (See Comments)    Medical tapes irritates the patient's skin   Tea Nausea And Vomiting    Family History  Problem Relation Age of Onset   Diabetes Maternal Grandmother    Kidney disease Maternal Grandmother    Breast cancer Paternal Grandmother    Heart disease Other    Colon cancer Cousin    Esophageal cancer Neg Hx    Rectal cancer Neg Hx    Stomach cancer Neg Hx       Prior to Admission medications   Medication Sig Start Date End Date Taking? Authorizing Provider  acetaminophen (  TYLENOL) 500 MG tablet Take 500 mg by mouth every 6 (six) hours as needed for mild pain or moderate pain.   Yes [provider]  albuterol (VENTOLIN HFA) 108 (90 Base) MCG/ACT inhaler Inhale 1-2 puffs into the lungs every 6 (six) hours as needed for wheezing or shortness of breath. 01/08/21  Yes Mound, Hildred Alamin E, FNP  ibuprofen (ADVIL) 200 MG tablet Take 600 mg by mouth every 6 (six) hours as needed for mild pain.   Yes [provider]  ibuprofen (ADVIL) 800 MG tablet Take 1 tablet (800 mg total) by mouth 3 (three) times daily. Patient not taking: Reported on 08/03/2021 03/03/20   Wieters, Hallie C,  PA-C  ondansetron (ZOFRAN-ODT) 4 MG disintegrating tablet Take 1 tablet (4 mg total) by mouth every 8 (eight) hours as needed for nausea or vomiting. Patient not taking: Reported on 08/03/2021 05/27/21   Teodora Medici, FNP    Physical Exam: BP 124/82   Pulse (!) 106   Temp 98.6 F (37 C) (Oral)   Resp 18   SpO2 99%   General: 31 y.o. year-old male well developed well nourished in no acute distress.  Alert and oriented x3. Cardiovascular: Regular rate and rhythm with no rubs or gallops.  No thyromegaly or JVD noted.  No lower extremity edema. 2/4 pulses in all 4 extremities. Respiratory: Clear to auscultation with no wheezes or rales. Good inspiratory effort. Abdomen: Soft nontender nondistended with normal bowel sounds x4 quadrants. Muskuloskeletal: No cyanosis, clubbing or edema noted bilaterally Neuro: CN II-XII intact, strength, sensation, reflexes Skin: No ulcerative lesions noted or rashes Psychiatry: Judgement and insight appear normal. Mood is appropriate for condition and setting          Labs on Admission:  Basic Metabolic Panel: Recent Labs  Lab 08/03/21 1659  NA 132*  K 3.8  CL 100  CO2 20*  GLUCOSE 105*  BUN 8  CREATININE 0.69  CALCIUM 8.9   Liver Function Tests: Recent Labs  Lab 08/03/21 1659  AST 14*  ALT 10  ALKPHOS 67  BILITOT 0.8  PROT 7.6  ALBUMIN 3.4*   No results for input(s): LIPASE, AMYLASE in the last 168 hours. No results for input(s): AMMONIA in the last 168 hours. CBC: Recent Labs  Lab 08/03/21 1659  WBC 14.6*  NEUTROABS 11.6*  HGB 8.6*  HCT 31.8*  MCV 71.6*  PLT 448*   Cardiac Enzymes: No results for input(s): CKTOTAL, CKMB, CKMBINDEX, TROPONINI in the last 168 hours.  BNP (last 3 results) No results for input(s): BNP in the last 8760 hours.  ProBNP (last 3 results) No results for input(s): PROBNP in the last 8760 hours.  CBG: No results for input(s): GLUCAP in the last 168 hours.  Radiological Exams on Admission: CT  Angio Chest PE W and/or Wo Contrast  Addendum Date: 08/03/2021   ADDENDUM REPORT: 08/03/2021 21:26 ADDENDUM: Critical findings were reported to Dr. Rayna Sexton at 9:25 p.m. Electronically Signed   By: Brett Fairy M.D.   On: 08/03/2021 21:26   Result Date: 08/03/2021 CLINICAL DATA:  Pulmonary embolism suspected, high probability. Tachycardia. EXAM: CT ANGIOGRAPHY CHEST WITH CONTRAST TECHNIQUE: Multidetector CT imaging of the chest was performed using the standard protocol during bolus administration of intravenous contrast. Multiplanar CT image reconstructions and MIPs were obtained to evaluate the vascular anatomy. RADIATION DOSE REDUCTION: This exam was performed according to the departmental dose-optimization program which includes automated exposure control, adjustment of the mA and/or kV according to patient  size and/or use of iterative reconstruction technique. CONTRAST:  58m OMNIPAQUE IOHEXOL 350 MG/ML SOLN COMPARISON:  None Available. FINDINGS: Cardiovascular: Heart is normal in size and there is no pericardial effusion. The aorta and pulmonary trunk are normal in caliber. There are lobar, segmental, and subsegmental pulmonary artery filling defects bilaterally with moderate to large emboli burden. No evidence of right heart strain. Mediastinum/Nodes: No enlarged mediastinal, hilar, or axillary lymph nodes. Thyroid gland, trachea, and esophagus demonstrate no significant findings. Lungs/Pleura: Minimal apical pleural scarring is noted bilaterally. No consolidation, effusion, or pneumothorax. Upper Abdomen: No acute abnormality. Musculoskeletal: No acute osseous abnormality. Review of the MIP images confirms the above findings. IMPRESSION: 1. Bilateral lobar, segmental, and subsegmental pulmonary emboli with moderate-to-large thrombus burden. No evidence of right heart strain. 2. No acute infiltrate in the lungs. Electronically Signed: By: LBrett FairyM.D. On: 08/03/2021 21:21   VAS [place]IVC/ILIAC  (VENOUS ONLY)  Result Date: 08/03/2021 IVC/ILIAC STUDY Patient Name:  JJAYAN RAYMUNDO Date of Exam:   08/03/2021 Medical Rec #: [id number]        Accession #:    [id number] of Birth: [birth year]         Patient Gender: Patient Age:   89years [facility]      [facility] [place]IVC/ILIAC (VENOUS ONLY) Referring Phys: --------------------------------------------------------------------------------  Indications: Extensive left lower extremity DVT with extension above inguinal              ligament Limitations: Air/bowel gas.  Comparison Study: 08-03-2021 Left lower extremity venous study. Performing Technologist: RDarlin CocoRDMS, RVT  Examination Guidelines: A complete evaluation includes B-mode imaging, spectral Doppler, color Doppler, and power Doppler as needed of all accessible portions of each vessel. Bilateral testing is considered an integral part of a complete examination. Limited examinations for reoccurring indications may be performed as noted.  IVC/Iliac Findings: +----------+------+--------+--------+    IVC    PatentThrombusComments +----------+------+--------+--------+ IVC Mid   patent                 +----------+------+--------+--------+ IVC Distalpatent                 +----------+------+--------+--------+  +-------------------+---------+-----------+---------+-----------+--------+         CIV        RT-PatentRT-ThrombusLT-PatentLT-ThrombusComments +-------------------+---------+-----------+---------+-----------+--------+ Common Iliac Prox   patent                         acute            +-------------------+---------+-----------+---------+-----------+--------+ Common Iliac Mid    patent                         acute            +-------------------+---------+-----------+---------+-----------+--------+ Common Iliac Distal patent                         acute             +-------------------+---------+-----------+---------+-----------+--------+  +-------------------------+---------+-----------+---------+-----------+--------+            EIV           RT-PatentRT-ThrombusLT-PatentLT-ThrombusComments +-------------------------+---------+-----------+---------+-----------+--------+ External Iliac Vein Prox  patent                         acute            +-------------------------+---------+-----------+---------+-----------+--------+ External Iliac Vein  Mid   patent                         acute            +-------------------------+---------+-----------+---------+-----------+--------+ External Iliac Vein       patent                         acute            Distal                                                                    +-------------------------+---------+-----------+---------+-----------+--------+   Summary: IVC/Iliac: There is no evidence of thrombus involving the IVC. There is no evidence of thrombus involving the right common iliac vein. There is evidence of acute thrombus involving the left common iliac vein. There is no evidence of thrombus involving the right external iliac vein. There is evidence of acute thrombus involving the left external iliac vein. Visualization of proximal Inferior Vena Cava was limited.  *See table(s) above for measurements and observations.  Electronically signed by Orlie Pollen on 08/03/2021 at 7:09:53 PM.    Final    CT VENOGRAM ABD/PEL  Result Date: 08/03/2021 CLINICAL DATA:  Follow-up extensive DVT seen on ultrasound imaging. EXAM: CT VENOGRAM ABDOMEN AND PELVIS TECHNIQUE: Multidetector CT imaging of the abdomen and pelvis was performed using the standard protocol following bolus administration of intravenous contrast. RADIATION DOSE REDUCTION: This exam was performed according to the departmental dose-optimization program which includes automated exposure control, adjustment of the mA and/or kV  according to patient size and/or use of iterative reconstruction technique. CONTRAST:  30m OMNIPAQUE IOHEXOL 350 MG/ML SOLN COMPARISON:  November 16, 2016 FINDINGS: Lower chest: A moderate amount of intraluminal low attenuation is seen within the lower lobe branches of the right pulmonary artery. Hepatobiliary: No focal liver abnormality is seen. No gallstones, gallbladder wall thickening, or biliary dilatation. Pancreas: Unremarkable. No pancreatic ductal dilatation or surrounding inflammatory changes. Spleen: Normal in size without focal abnormality. Adrenals/Urinary Tract: Adrenal glands are unremarkable. Kidneys are normal, without renal calculi, focal lesion, or hydronephrosis. Bladder is unremarkable. Stomach/Bowel: Stomach is within normal limits. Appendix appears normal. No evidence of bowel wall thickening, distention, or inflammatory changes. Vascular/Lymphatic: The arterial structures within the abdomen pelvis are unremarkable. An extensive amount of intraluminal low attenuation is seen within the left common iliac vein. This originates from its origin and extends throughout the left external iliac vein and left internal iliac vein, with progression to involve the visualized portion of the left common femoral vein and deep left femoral vein. Associated perivascular inflammatory fat stranding is seen. No enlarged abdominal or pelvic lymph nodes. Reproductive: Prostate is unremarkable. Other: No abdominal wall hernia or abnormality. No abdominopelvic ascites. Musculoskeletal: No acute or significant osseous findings. IMPRESSION: 1. Moderate amount of pulmonary embolism within the lower lobe branches of the RIGHT pulmonary artery. 2. Extensive amount of occlusive thrombus within the LEFT common iliac vein, LEFT external iliac vein and LEFT internal iliac vein, with progression to involve the visualized portion of the LEFT common femoral vein and deep left femoral vein. Electronically Signed   By: TVirgina NorfolkM.D.   On:  08/03/2021 22:20   DG Hip Unilat W or Wo Pelvis 2-3 Views Left  Result Date: 08/03/2021 CLINICAL DATA:  Left hip pain EXAM: DG HIP (WITH OR WITHOUT PELVIS) 2-3V LEFT COMPARISON:  None Available. FINDINGS: There is no evidence of hip fracture or dislocation. There is no evidence of arthropathy or other focal bone abnormality. IMPRESSION: Negative. Electronically Signed   By: Davina Poke D.O.   On: 08/03/2021 17:22   VAS [place] LOWER EXTREMITY VENOUS (DVT) (ONLY [facility] & [facility])  Result Date: 08/03/2021  Lower Venous DVT Study Patient Name:  ACEYN KATHOL  Date of Exam:   08/03/2021 Medical Rec #: [id number]         Accession #:    [id number] Date of Birth: 07-Sep-1990          Patient Gender: M Patient Age:   83 years Exam Location:  [facility] Procedure:      VAS [place] LOWER EXTREMITY VENOUS (DVT) Referring Phys: LESLIE SOFIA --------------------------------------------------------------------------------  Indications: Pain left leg.  Comparison Study: No prior studies. Performing Technologist: Darlin Coco RDMS, RVT  Examination Guidelines: A complete evaluation includes B-mode imaging, spectral Doppler, color Doppler, and power Doppler as needed of all accessible portions of each vessel. Bilateral testing is considered an integral part of a complete examination. Limited examinations for reoccurring indications may be performed as noted. The reflux portion of the exam is performed with the patient in reverse Trendelenburg.  +-----+---------------+---------+-----------+----------+--------------+ RIGHTCompressibilityPhasicitySpontaneityPropertiesThrombus Aging +-----+---------------+---------+-----------+----------+--------------+ CFV  Full           Yes      Yes                                 +-----+---------------+---------+-----------+----------+--------------+   +---------+---------------+---------+-----------+----------+--------------+ LEFT      CompressibilityPhasicitySpontaneityPropertiesThrombus Aging +---------+---------------+---------+-----------+----------+--------------+ CFV      None           No       No                   Acute          +---------+---------------+---------+-----------+----------+--------------+ SFJ      None           No       No                   Acute          +---------+---------------+---------+-----------+----------+--------------+ FV Prox  None           No       No                   Acute          +---------+---------------+---------+-----------+----------+--------------+ FV Mid   None           No       No                   Acute          +---------+---------------+---------+-----------+----------+--------------+ FV DistalNone           No       No                   Acute          +---------+---------------+---------+-----------+----------+--------------+ PFV      None           No  No                   Acute          +---------+---------------+---------+-----------+----------+--------------+ POP      None           No       No                   Acute          +---------+---------------+---------+-----------+----------+--------------+ PTV      None           No       No                   Acute          +---------+---------------+---------+-----------+----------+--------------+ PERO     None           No       No                   Acute          +---------+---------------+---------+-----------+----------+--------------+ Gastroc  None           No       No                   Acute          +---------+---------------+---------+-----------+----------+--------------+     Summary: RIGHT: - No evidence of common femoral vein obstruction.  LEFT: - Findings consistent with acute deep vein thrombosis involving the left common femoral vein, SF junction, left femoral vein, left proximal profunda vein, left popliteal vein, left posterior tibial veins, left  peroneal veins, and left gastrocnemius veins. - No cystic structure found in the popliteal fossa. - Possible obstruction proximal to the inguinal ligament.  *See table(s) above for measurements and observations. Electronically signed by Orlie Pollen on 08/03/2021 at 7:09:29 PM.    Final     EKG: I independently viewed the EKG done and my findings are as followed: None available at the time of the visit.  Ordered.  Assessment/Plan Present on Admission:  Acute DVT (deep venous thrombosis) (HCC)  Principal Problem:   Acute DVT (deep venous thrombosis) (HCC)  Acute extensive left lower extremity DVT, POA Acute bilateral pulmonary embolism with no right heart strain seen on CT scan, POA. Started on heparin drip in the ED, continue Seen by vascular surgery, recommended n.p.o. after midnight for possible thrombectomy Follow 2D echo Analgesics as needed along with bowel regimen  Hypovolemic hyponatremia Presented with serum sodium 132 Started normal saline at 50 cc/h x 2 days Repeat chemistry panel in the morning  Alcohol use disorder Drinks 5-6 beers per day CIWA protocol started in the ED, continue Multivitamin, folic acid, thiamine.  Polysubstance abuse Including cocaine, tobacco, alcohol Obtain UDS Polysubstance cessation counseling TOC to assist with providing resources for sobriety  Moderate protein calorie malnutrition Albumin 3.4 Moderate muscle mass loss Encourage oral intake   Critical care time: 65 minutes.    DVT prophylaxis: Heparin drip  Code Status: Full code  Family Communication: None at bedside  Disposition Plan: Admitted to progressive unit  Consults called: Cardiology  Admission status: Inpatient status.   Status is: Inpatient The patient requires at least 2 midnights for further evaluation and treatment of present condition   Kayleen Memos MD [facility] Pager [phone]  If 7PM-7AM, please contact  night-coverage [email or web address] Password [email or web address]  08/03/2021, 11:10 PM

## 2021-08-03 NOTE — Progress Notes (Signed)
ANTICOAGULATION CONSULT NOTE - Initial Consult  Pharmacy Consult for heparin Indication: DVT  Allergies  Allergen Reactions   Tape Other (See Comments)    Medical tapes irritates the patient's skin   Tea Nausea And Vomiting    Patient Measurements:   Heparin Dosing Weight: TBW  Vital Signs: Temp: 99.4 F (37.4 C) (06/05 1626) Temp Source: Oral (06/05 1626) BP: 124/87 (06/05 1847) Pulse Rate: 102 (06/05 1847)  Labs: Recent Labs    08/03/21 1659  HGB 8.6*  HCT 31.8*  PLT 448*  CREATININE 0.69    CrCl cannot be calculated (Unknown ideal weight.).   Medical History: Past Medical History:  Diagnosis Date   ADHD (attention deficit hyperactivity disorder)    Alcohol abuse    Alcohol withdrawal syndrome without complication (HCC)    Alcoholic ketoacidosis 07/03/7320   Anxiety    Asthma    Depression    suicidal in past   History of adenomatous polyp of colon 01/04/2019   Right clavicle fracture 11/2018    Assessment: 64 YOM presenting with leg pain, doppler with DVT, he is not on anticoagulation PTA  Goal of Therapy:  Heparin level 0.3-0.7 units/ml Monitor platelets by anticoagulation protocol: Yes   Plan:  Heparin 4000 units IV x 1, and gtt at 1000 units/hr F/u 6 hour heparin level  Bertis Ruddy, PharmD Clinical Pharmacist ED Pharmacist Phone # [phone] 08/03/2021 7:41 PM

## 2021-08-03 NOTE — Consult Note (Addendum)
Hospital Consult    Reason for Consult: Extensive left iliofemoral DVT Requesting Physician: ED MRN #:  [id number]  History of Present Illness: This is a 31 y.o. male who presented to the hospital with a several-day history of shooting pain in the left leg.  Several days ago he felt a pop in his left thigh noting pain radiating down his leg.  He denied breathing difficulties, sensory or motor deficits.  Vascular surgery was called after left lower extremity venous duplex ultrasound demonstrated extensive iliofemoral DVT.  On exam, Winn was accompanied by his mother.  He is a 1.5 pack/day smoker.  Recently let go from his job.  Over the last several days, he has been minimally ambulatory, laying in bed.  He denies previous history of DVT.  No family history of DVT.  No recent travel.  Past Medical History:  Diagnosis Date   ADHD (attention deficit hyperactivity disorder)    Alcohol abuse    Alcohol withdrawal syndrome without complication (HCC)    Alcoholic ketoacidosis 03/06/9676   Anxiety    Asthma    Depression    suicidal in past   History of adenomatous polyp of colon 01/04/2019   Right clavicle fracture 11/2018    Past Surgical History:  Procedure Laterality Date   HERNIA REPAIR     Performed when 6 months old    Allergies  Allergen Reactions   Tape Other (See Comments)    Medical tapes irritates the patient's skin   Tea Nausea And Vomiting    Prior to Admission medications   Medication Sig Start Date End Date Taking? Authorizing Provider  albuterol (VENTOLIN HFA) 108 (90 Base) MCG/ACT inhaler Inhale 1-2 puffs into the lungs every 6 (six) hours as needed for wheezing or shortness of breath. 01/08/21   Teodora Medici, FNP  ibuprofen (ADVIL) 800 MG tablet Take 1 tablet (800 mg total) by mouth 3 (three) times daily. 03/03/20   Wieters, Hallie C, PA-C  ondansetron (ZOFRAN-ODT) 4 MG disintegrating tablet Take 1 tablet (4 mg total) by mouth every 8 (eight) hours as needed for  nausea or vomiting. 05/27/21   Teodora Medici, FNP    Social History   Socioeconomic History   Marital status: Single    Spouse name: Not on file   Number of children: 1   Years of education: Not on file   Highest education level: Not on file  Occupational History   Not on file  Tobacco Use   Smoking status: Every Day    Packs/day: 1.50    Years: 15.00    Pack years: 22.50    Types: Cigarettes   Smokeless tobacco: Never  Vaping Use   Vaping Use: Never used  Substance and Sexual Activity   Alcohol use: Yes   Drug use: Yes    Frequency: 1.0 times per week    Types: Marijuana   Sexual activity: Not Currently  Other Topics Concern   Not on file  Social History Narrative   Not on file   Social Determinants of Health   Financial Resource Strain: Not on file  Food Insecurity: Not on file  Transportation Needs: Not on file  Physical Activity: Not on file  Stress: Not on file  Social Connections: Not on file  Intimate Partner Violence: Not on file    Family History  Problem Relation Age of Onset   Diabetes Maternal Grandmother    Kidney disease Maternal Grandmother    Breast cancer Paternal Grandmother  Heart disease Other    Colon cancer Cousin    Esophageal cancer Neg Hx    Rectal cancer Neg Hx    Stomach cancer Neg Hx     ROS: Otherwise negative unless mentioned in HPI  Physical Examination  Vitals:   08/03/21 1944 08/03/21 1952  BP:    Pulse:  (!) 113  Resp:    Temp: (!) 101.9 F (38.8 C)   SpO2:     There is no height or weight on file to calculate BMI.  General:  WDWN in NAD Gait: Not observed HENT: WNL, normocephalic Pulmonary: normal non-labored breathing, without Rales, rhonchi,  wheezing Cardiac: Tachycardic Abdomen: soft, NT/ND, no masses Skin: without rashes Vascular Exam/Pulses: 2+ PT bilaterally  Extremities: without ischemic changes, without Gangrene , without cellulitis; without open wounds;  Musculoskeletal: no muscle wasting or  atrophy  Neurologic: A&O X 3;  No focal weakness or paresthesias are detected; speech is fluent/normal Psychiatric:  The pt has Normal affect. Lymph:  Unremarkable  CBC    Component Value Date/Time   WBC 14.6 (H) 08/03/2021 1659   RBC 4.44 08/03/2021 1659   HGB 8.6 (L) 08/03/2021 1659   HCT 31.8 (L) 08/03/2021 1659   PLT 448 (H) 08/03/2021 1659   MCV 71.6 (L) 08/03/2021 1659   MCH 19.4 (L) 08/03/2021 1659   MCHC 27.0 (L) 08/03/2021 1659   RDW 18.2 (H) 08/03/2021 1659   LYMPHSABS 1.6 08/03/2021 1659   MONOABS 1.3 (H) 08/03/2021 1659   EOSABS 0.1 08/03/2021 1659   BASOSABS 0.0 08/03/2021 1659    BMET    Component Value Date/Time   NA 132 (L) 08/03/2021 1659   K 3.8 08/03/2021 1659   CL 100 08/03/2021 1659   CO2 20 (L) 08/03/2021 1659   GLUCOSE 105 (H) 08/03/2021 1659   BUN 8 08/03/2021 1659   CREATININE 0.69 08/03/2021 1659   CREATININE 0.85 09/23/2015 1720   CALCIUM 8.9 08/03/2021 1659   GFRNONAA >60 08/03/2021 1659   GFRAA >60 07/08/2018 0759    COAGS: No results found for: INR, PROTIME   Non-Invasive Vascular Imaging:   Extensive left lower extremity DVT extending from the common iliac vein through the tibial veins   ASSESSMENT/PLAN: This is a 31 y.o. male with extensive left lower extremity acute DVT extending from the common iliac vein through the tibial veins.  He does not have phlegmasia.  He is having significant pain.  No sensorimotor deficits.  I had a long conversation with Winferd and his mother regarding percutaneous thrombectomy in an effort to relieve the thrombotic burden and improve pain symptoms.  Nicolis would benefit from daily aspirin '81mg'$ , heparin dripPatient with likely asymptomatic pulmonary embolus-recommend CT angio chest Please order CT venogram abdomen pelvis to further define thrombus burden and anatomy N.p.o. midnight After discussing risks and benefits, Almyra Free elected to proceed.  I will work to schedule. Possible percutaneous venous  thrombectomy tomorrow morning pending availability   Cassandria Santee MD [facility] [phone] 08/03/2021  9:06 PM

## 2021-08-04 ENCOUNTER — Other Ambulatory Visit: Payer: Self-pay

## 2021-08-04 ENCOUNTER — Inpatient Hospital Stay (HOSPITAL_COMMUNITY): Payer: Self-pay

## 2021-08-04 DIAGNOSIS — E46 Unspecified protein-calorie malnutrition: Secondary | ICD-10-CM

## 2021-08-04 DIAGNOSIS — I2699 Other pulmonary embolism without acute cor pulmonale: Secondary | ICD-10-CM

## 2021-08-04 DIAGNOSIS — D509 Iron deficiency anemia, unspecified: Secondary | ICD-10-CM

## 2021-08-04 LAB — COMPREHENSIVE METABOLIC PANEL
ALT: 7 U/L (ref 0–44)
AST: 11 U/L — ABNORMAL LOW (ref 15–41)
Albumin: 2.8 g/dL — ABNORMAL LOW (ref 3.5–5.0)
Alkaline Phosphatase: 50 U/L (ref 38–126)
Anion gap: 7 (ref 5–15)
BUN: 7 mg/dL (ref 6–20)
CO2: 22 mmol/L (ref 22–32)
Calcium: 8.3 mg/dL — ABNORMAL LOW (ref 8.9–10.3)
Chloride: 105 mmol/L (ref 98–111)
Creatinine, Ser: 0.67 mg/dL (ref 0.61–1.24)
GFR, Estimated: >60 mL/min (ref >60)
Glucose, Bld: 105 mg/dL — ABNORMAL HIGH (ref 70–99)
Potassium: 3.7 mmol/L (ref 3.5–5.1)
Sodium: 134 mmol/L — ABNORMAL LOW (ref 135–145)
Total Bilirubin: 0.6 mg/dL (ref 0.3–1.2)
Total Protein: 6.3 g/dL — ABNORMAL LOW (ref 6.5–8.1)

## 2021-08-04 LAB — CBC WITH DIFFERENTIAL/PLATELET
Abs Immature Granulocytes: 0.07 10*3/uL (ref 0.00–0.07)
Basophils Absolute: 0 10*3/uL (ref 0.0–0.1)
Basophils Relative: 0 %
Eosinophils Absolute: 0.2 10*3/uL (ref 0.0–0.5)
Eosinophils Relative: 1 %
HCT: 25.6 % — ABNORMAL LOW (ref 39.0–52.0)
Hemoglobin: 7.2 g/dL — ABNORMAL LOW (ref 13.0–17.0)
Immature Granulocytes: 1 %
Lymphocytes Relative: 16 %
Lymphs Abs: 1.9 10*3/uL (ref 0.7–4.0)
MCH: 19.6 pg — ABNORMAL LOW (ref 26.0–34.0)
MCHC: 28.1 g/dL — ABNORMAL LOW (ref 30.0–36.0)
MCV: 69.6 fL — ABNORMAL LOW (ref 80.0–100.0)
Monocytes Absolute: 1.5 10*3/uL — ABNORMAL HIGH (ref 0.1–1.0)
Monocytes Relative: 12 %
Neutro Abs: 8.7 10*3/uL — ABNORMAL HIGH (ref 1.7–7.7)
Neutrophils Relative %: 70 %
Platelets: 356 10*3/uL (ref 150–400)
RBC: 3.68 MIL/uL — ABNORMAL LOW (ref 4.22–5.81)
RDW: 18 % — ABNORMAL HIGH (ref 11.5–15.5)
WBC: 12.3 10*3/uL — ABNORMAL HIGH (ref 4.0–10.5)
nRBC: 0 % (ref 0.0–0.2)

## 2021-08-04 LAB — IRON AND TIBC
Iron: 9 ug/dL — ABNORMAL LOW (ref 45–182)
Saturation Ratios: 3 % — ABNORMAL LOW (ref 17.9–39.5)
TIBC: 358 ug/dL (ref 250–450)
UIBC: 349 ug/dL

## 2021-08-04 LAB — ECHOCARDIOGRAM COMPLETE
AR max vel: 4.26 cm2
AV Area VTI: 4.11 cm2
AV Area mean vel: 4.16 cm2
AV Mean grad: 3 mmHg
AV Peak grad: 6.6 mmHg
Ao pk vel: 1.28 m/s
Area-P 1/2: 4.63 cm2
Calc EF: 52.6 %
MV VTI: 3.63 cm2
S' Lateral: 3.2 cm
Single Plane A2C EF: 46.9 %
Single Plane A4C EF: 61 %

## 2021-08-04 LAB — RAPID URINE DRUG SCREEN, HOSP PERFORMED
Amphetamines: NOT DETECTED
Barbiturates: NOT DETECTED
Benzodiazepines: NOT DETECTED
Cocaine: NOT DETECTED
Opiates: NOT DETECTED
Tetrahydrocannabinol: POSITIVE — AB

## 2021-08-04 LAB — FERRITIN: Ferritin: 11 ng/mL — ABNORMAL LOW (ref 24–336)

## 2021-08-04 LAB — HEPARIN LEVEL (UNFRACTIONATED)
Heparin Unfractionated: <0.1 IU/mL — ABNORMAL LOW (ref 0.30–0.70)
Heparin Unfractionated: <0.1 IU/mL — ABNORMAL LOW (ref 0.30–0.70)
Heparin Unfractionated: 0.49 IU/mL (ref 0.30–0.70)

## 2021-08-04 LAB — PHOSPHORUS: Phosphorus: 4.1 mg/dL (ref 2.5–4.6)

## 2021-08-04 LAB — MAGNESIUM: Magnesium: 2 mg/dL (ref 1.7–2.4)

## 2021-08-04 MED ORDER — HEPARIN BOLUS VIA INFUSION
3000.0000 [IU] | Freq: Once | INTRAVENOUS | Status: AC
  Administered 2021-08-04: 3000 [IU] via INTRAVENOUS
  Filled 2021-08-04: qty 3000

## 2021-08-04 NOTE — Progress Notes (Addendum)
PROGRESS NOTE    [name]  LNL:[id number] DOB: 1991-02-16 DOA: 08/03/2021 PCP: Enid Skeens., MD   Brief Narrative:  Mark Mcdaniel is a 31 y.o. male with medical history significant for polysubstance abuse including cocaine, THC, tobacco, alcohol use disorder who presented to [facility] ED from home with complaints of severe left lower extremity pain/swelling x2 days.    In the ED patient was diagnosed with left lower extremity DVT on Doppler, CTA revealed bilateral PE without heart strain.  Admitted for further evaluation and work-up, vascular surgery consulted for further expertise given clot burden.  Assessment & Plan:   Principal Problem:   Bilateral pulmonary embolism (HCC) Active Problems:   Alcohol abuse with alcohol-induced mood disorder (HCC)   Substance induced mood disorder (HCC)   Acute DVT (deep venous thrombosis) (HCC)   Iron deficiency anemia   Unspecified protein-calorie malnutrition (HCC)    Acute extensive left lower extremity DVT, POA Acute bilateral pulmonary embolism with no right heart strain seen on CT scan, POA. -Risk factors include tobacco abuse and sedentary lifestyle, admits to sitting for prolonged periods of time at home -upwards of 8 hours at a time per our discussion today -May benefit from genetic testing with hematology in the outpatient setting -Continue heparin drip -Vascular surgery following, currently holding off on intervention given high risk for clot dislodgment -May benefit from systemic treatment given patient's profound clot burden. -Echocardiogram pending, if no heart strain vascular may move forward with left lower extremity thrombectomy.  N.p.o. at midnight *If echo does indicate heart strain patient may require targeted thrombosis*   Hypovolemic hyponatremia, mild Follow labs, increase p.o. intake as appropriate, n.p.o. at midnight   Alcohol use/abuse disorder Tobacco abuse Polysubstance abuse Including cocaine Continues  to smoke Drinks approximately 76 ounces of beer per day Continue CIWA protocol, multivitamin folic acid and thiamine UDS + for THC only  Moderate protein calorie malnutrition Iron deficiency Albumin 3.4 Moderate muscle mass loss Encourage oral intake Likely secondary to alcohol/polysubstance abuse  DVT prophylaxis: Heparin drip Code Status: Full Family Communication: Mother at bedside  Status is: Inpatient  Dispo: The patient is from: Home              Anticipated d/c is to: Home              Anticipated d/c date is: 40 to 72 hours pending clinical course              Patient currently not medically stable for discharge  Consultants:  Vascular surgery  Procedures:  Pending above  Antimicrobials:  None indicated  Subjective: No acute issues or events overnight, patient anxious about possible procedure but denies nausea vomiting diarrhea constipation headache fevers chills or chest pain, left lower extremity pain improving but not yet resolved  Objective: Vitals:   08/04/21 1430 08/04/21 1500 08/04/21 1530 08/04/21 1548  BP: 106/61 99/63 121/82   Pulse: 90 82 87   Resp: (!) '21 18 19   '$ Temp:    98.1 F (36.7 C)  TempSrc:    Oral  SpO2: 96% 95% 96%     Intake/Output Summary (Last 24 hours) at 08/04/2021 1706 Last data filed at 08/03/2021 2234 Gross per 24 hour  Intake 1002.5 ml  Output --  Net 1002.5 ml   There were no vitals filed for this visit.  Examination:  General:  Pleasantly resting in bed, No acute distress. HEENT:  Normocephalic atraumatic.  Sclerae nonicteric, noninjected.  Extraocular movements intact  bilaterally. Neck:  Without mass or deformity.  Trachea is midline. Lungs:  Clear to auscultate bilaterally without rhonchi, wheeze, or rales. Heart:  Regular rate and rhythm.  Without murmurs, rubs, or gallops. Abdomen:  Soft, nontender, nondistended.  Without guarding or rebound. Extremities: Left lower extremity 1+ pitting edema, larger than right  lower extremity by approximately 3 cm. Vascular:  Dorsalis pedis and posterior tibial pulses palpable bilaterally. Skin:  Warm and dry, no erythema, no ulcerations.  Data Reviewed: I have personally reviewed following labs and imaging studies  CBC: Recent Labs  Lab 08/03/21 1659 08/04/21 0430  WBC 14.6* 12.3*  NEUTROABS 11.6* 8.7*  HGB 8.6* 7.2*  HCT 31.8* 25.6*  MCV 71.6* 69.6*  PLT 448* 086   Basic Metabolic Panel: Recent Labs  Lab 08/03/21 1659 08/04/21 0430  NA 132* 134*  K 3.8 3.7  CL 100 105  CO2 20* 22  GLUCOSE 105* 105*  BUN 8 7  CREATININE 0.69 0.67  CALCIUM 8.9 8.3*  MG  --  2.0  PHOS  --  4.1   GFR: CrCl cannot be calculated (Unknown ideal weight.). Liver Function Tests: Recent Labs  Lab 08/03/21 1659 08/04/21 0430  AST 14* 11*  ALT 10 7  ALKPHOS 67 50  BILITOT 0.8 0.6  PROT 7.6 6.3*  ALBUMIN 3.4* 2.8*   No results for input(s): LIPASE, AMYLASE in the last 168 hours. No results for input(s): AMMONIA in the last 168 hours. Coagulation Profile: No results for input(s): INR, PROTIME in the last 168 hours. Cardiac Enzymes: No results for input(s): CKTOTAL, CKMB, CKMBINDEX, TROPONINI in the last 168 hours. BNP (last 3 results) No results for input(s): PROBNP in the last 8760 hours. HbA1C: No results for input(s): HGBA1C in the last 72 hours. CBG: No results for input(s): GLUCAP in the last 168 hours. Lipid Profile: No results for input(s): CHOL, HDL, LDLCALC, TRIG, CHOLHDL, LDLDIRECT in the last 72 hours. Thyroid Function Tests: No results for input(s): TSH, T4TOTAL, FREET4, T3FREE, THYROIDAB in the last 72 hours. Anemia Panel: Recent Labs    08/04/21 0430  FERRITIN 11*  TIBC 358  IRON 9*   Sepsis Labs: Recent Labs  Lab 08/03/21 1958 08/03/21 2228  LATICACIDVEN 1.2 1.0    Recent Results (from the past 240 hour(s))  Culture, blood (routine x 2)     Status: None (Preliminary result)   Collection Time: 08/03/21  7:18 PM   Specimen:  BLOOD LEFT ARM  Result Value Ref Range Status   Specimen Description BLOOD LEFT ARM  Final   Special Requests   Final    BOTTLES DRAWN AEROBIC AND ANAEROBIC Blood Culture adequate volume   Culture   Final    NO GROWTH < 12 HOURS Performed at [facility], [place] [address] [place], [place] [address]    Report Status PENDING  Incomplete  Culture, blood (routine x 2)     Status: None (Preliminary result)   Collection Time: 08/03/21  8:05 PM   Specimen: BLOOD RIGHT ARM  Result Value Ref Range Status   Specimen Description BLOOD RIGHT ARM  Final   Special Requests   Final    BOTTLES DRAWN AEROBIC AND ANAEROBIC Blood Culture adequate volume   Culture   Final    NO GROWTH < 12 HOURS Performed at [facility], [place] [address] [place], [place] [address]    Report Status PENDING  Incomplete         Radiology Studies: CT Angio  Chest PE W and/or Wo Contrast  Addendum Date: 08/03/2021   ADDENDUM REPORT: 08/03/2021 21:26 ADDENDUM: Critical findings were reported to Dr. Rayna Sexton at 9:25 p.m. Electronically Signed   By: Brett Fairy M.D.   On: 08/03/2021 21:26   Result Date: 08/03/2021 CLINICAL DATA:  Pulmonary embolism suspected, high probability. Tachycardia. EXAM: CT ANGIOGRAPHY CHEST WITH CONTRAST TECHNIQUE: Multidetector CT imaging of the chest was performed using the standard protocol during bolus administration of intravenous contrast. Multiplanar CT image reconstructions and MIPs were obtained to evaluate the vascular anatomy. RADIATION DOSE REDUCTION: This exam was performed according to the departmental dose-optimization program which includes automated exposure control, adjustment of the mA and/or kV according to patient size and/or use of iterative reconstruction technique. CONTRAST:  68m OMNIPAQUE IOHEXOL 350 MG/ML SOLN COMPARISON:  None Available. FINDINGS: Cardiovascular: Heart is normal in size and there is no pericardial effusion. The aorta and pulmonary trunk are  normal in caliber. There are lobar, segmental, and subsegmental pulmonary artery filling defects bilaterally with moderate to large emboli burden. No evidence of right heart strain. Mediastinum/Nodes: No enlarged mediastinal, hilar, or axillary lymph nodes. Thyroid gland, trachea, and esophagus demonstrate no significant findings. Lungs/Pleura: Minimal apical pleural scarring is noted bilaterally. No consolidation, effusion, or pneumothorax. Upper Abdomen: No acute abnormality. Musculoskeletal: No acute osseous abnormality. Review of the MIP images confirms the above findings. IMPRESSION: 1. Bilateral lobar, segmental, and subsegmental pulmonary emboli with moderate-to-large thrombus burden. No evidence of right heart strain. 2. No acute infiltrate in the lungs. Electronically Signed: By: LBrett FairyM.D. On: 08/03/2021 21:21   VAS [place]IVC/ILIAC (VENOUS ONLY)  Result Date: 08/03/2021 IVC/ILIAC STUDY Patient Name:  JTAWFIQ FAVILA Date of Exam:   08/03/2021 Medical Rec #: [id number]        Accession #:    [id number] of Birth: 406-04-1990         Patient Gender: Patient Age:   89years [facility]      [facility] [place]IVC/ILIAC (VENOUS ONLY) Referring Phys: --------------------------------------------------------------------------------  Indications: Extensive left lower extremity DVT with extension above inguinal              ligament Limitations: Air/bowel gas.  Comparison Study: 08-03-2021 Left lower extremity venous study. Performing Technologist: RDarlin CocoRDMS, RVT  Examination Guidelines: A complete evaluation includes B-mode imaging, spectral Doppler, color Doppler, and power Doppler as needed of all accessible portions of each vessel. Bilateral testing is considered an integral part of a complete examination. Limited examinations for reoccurring indications may be performed as noted.  IVC/Iliac Findings: +----------+------+--------+--------+    IVC     PatentThrombusComments +----------+------+--------+--------+ IVC Mid   patent                 +----------+------+--------+--------+ IVC Distalpatent                 +----------+------+--------+--------+  +-------------------+---------+-----------+---------+-----------+--------+         CIV        RT-PatentRT-ThrombusLT-PatentLT-ThrombusComments +-------------------+---------+-----------+---------+-----------+--------+ Common Iliac Prox   patent                         acute            +-------------------+---------+-----------+---------+-----------+--------+ Common Iliac Mid    patent                         acute            +-------------------+---------+-----------+---------+-----------+--------+  Common Iliac Distal patent                         acute            +-------------------+---------+-----------+---------+-----------+--------+  +-------------------------+---------+-----------+---------+-----------+--------+            EIV           RT-PatentRT-ThrombusLT-PatentLT-ThrombusComments +-------------------------+---------+-----------+---------+-----------+--------+ External Iliac Vein Prox  patent                         acute            +-------------------------+---------+-----------+---------+-----------+--------+ External Iliac Vein Mid   patent                         acute            +-------------------------+---------+-----------+---------+-----------+--------+ External Iliac Vein       patent                         acute            Distal                                                                    +-------------------------+---------+-----------+---------+-----------+--------+   Summary: IVC/Iliac: There is no evidence of thrombus involving the IVC. There is no evidence of thrombus involving the right common iliac vein. There is evidence of acute thrombus involving the left common iliac vein. There is no evidence of  thrombus involving the right external iliac vein. There is evidence of acute thrombus involving the left external iliac vein. Visualization of proximal Inferior Vena Cava was limited.  *See table(s) above for measurements and observations.  Electronically signed by Orlie Pollen on 08/03/2021 at 7:09:53 PM.    Final    ECHOCARDIOGRAM COMPLETE  Result Date: 08/04/2021    ECHOCARDIOGRAM REPORT   Patient Name:   CHRIST FULLENWIDER Foresta Date of Exam: 08/04/2021 Medical Rec #:  [id number]        Height:       71.0 in Accession #:    [id number]       Weight:       135.0 lb Date of Birth:  07/18/1990         BSA:          1.784 m Patient Age:    31 years         BP:           109/86 mmHg Patient Gender: M                HR:           101 bpm. Exam Location:  Inpatient Procedure: 2D Echo, Cardiac Doppler, Color Doppler and Strain Analysis Indications:    Pulmonary embolus  History:        Patient has no prior history of Echocardiogram examinations.  Sonographer:    [place] Referring Phys: [id number] [place]  1. Left ventricular ejection fraction, by estimation, is 55 to 60%. The left ventricle has normal function. The left ventricle has no regional wall motion abnormalities. Indeterminate diastolic filling due to E-A  fusion.  2. Right ventricular systolic function is normal. The right ventricular size is normal. Tricuspid regurgitation signal is inadequate for assessing PA pressure.  3. The mitral valve is grossly normal. No evidence of mitral valve regurgitation. No evidence of mitral stenosis.  4. The aortic valve is tricuspid. Aortic valve regurgitation is not visualized. No aortic stenosis is present.  5. The inferior vena cava is normal in size with greater than 50% respiratory variability, suggesting right atrial pressure of 3 mmHg. Conclusion(s)/Recommendation(s): Normal biventricular function without evidence of hemodynamically significant valvular heart disease. FINDINGS  Left Ventricle: Left  ventricular ejection fraction, by estimation, is 55 to 60%. The left ventricle has normal function. The left ventricle has no regional wall motion abnormalities. Global longitudinal strain performed but not reported based on interpreter judgement due to suboptimal tracking. The left ventricular internal cavity size was normal in size. There is no left ventricular hypertrophy. Indeterminate diastolic filling due to E-A fusion. Right Ventricle: The right ventricular size is normal. No increase in right ventricular wall thickness. Right ventricular systolic function is normal. Tricuspid regurgitation signal is inadequate for assessing PA pressure. Left Atrium: Left atrial size was normal in size. Right Atrium: Right atrial size was normal in size. Pericardium: There is no evidence of pericardial effusion. Mitral Valve: The mitral valve is grossly normal. No evidence of mitral valve regurgitation. No evidence of mitral valve stenosis. MV peak gradient, 4.2 mmHg. The mean mitral valve gradient is 3.0 mmHg. Tricuspid Valve: The tricuspid valve is grossly normal. Tricuspid valve regurgitation is not demonstrated. No evidence of tricuspid stenosis. Aortic Valve: The aortic valve is tricuspid. Aortic valve regurgitation is not visualized. No aortic stenosis is present. Aortic valve mean gradient measures 3.0 mmHg. Aortic valve peak gradient measures 6.6 mmHg. Aortic valve area, by VTI measures 4.11 cm. Pulmonic Valve: The pulmonic valve was grossly normal. Pulmonic valve regurgitation is not visualized. No evidence of pulmonic stenosis. Aorta: The aortic root and ascending aorta are structurally normal, with no evidence of dilitation. Venous: The inferior vena cava is normal in size with greater than 50% respiratory variability, suggesting right atrial pressure of 3 mmHg. IAS/Shunts: The atrial septum is grossly normal.  LEFT VENTRICLE PLAX 2D LVIDd:         4.90 cm     Diastology LVIDs:         3.20 cm     LV e' medial:     11.10 cm/s LV PW:         1.00 cm     LV E/e' medial:  8.3 LV IVS:        0.90 cm     LV e' lateral:   14.00 cm/s LVOT diam:     2.50 cm     LV E/e' lateral: 6.6 LV SV:         79 LV SV Index:   44 LVOT Area:     4.91 cm  LV Volumes (MOD) LV vol d, MOD A2C: 91.1 ml LV vol d, MOD A4C: 83.1 ml LV vol s, MOD A2C: 48.4 ml LV vol s, MOD A4C: 32.4 ml LV SV MOD A2C:     42.7 ml LV SV MOD A4C:     83.1 ml LV SV MOD BP:      46.6 ml RIGHT VENTRICLE RV Basal diam:  4.00 cm RV Mid diam:    3.20 cm LEFT ATRIUM           Index  RIGHT ATRIUM          Index LA Vol (A4C): 18.5 ml 10.37 ml/m  RA Area:     6.96 cm                                    RA Volume:   10.90 ml 6.11 ml/m  AORTIC VALVE                    PULMONIC VALVE AV Area (Vmax):    4.26 cm     PV Vmax:       1.18 m/s AV Area (Vmean):   4.16 cm     PV Vmean:      84.700 cm/s AV Area (VTI):     4.11 cm     PV VTI:        0.203 m AV Vmax:           128.00 cm/s  PV Peak grad:  5.6 mmHg AV Vmean:          84.500 cm/s  PV Mean grad:  3.0 mmHg AV VTI:            0.192 m AV Peak Grad:      6.6 mmHg AV Mean Grad:      3.0 mmHg LVOT Vmax:         111.00 cm/s LVOT Vmean:        71.600 cm/s LVOT VTI:          0.160 m LVOT/AV VTI ratio: 0.84  AORTA Ao Root diam: 3.10 cm Ao Asc diam:  3.20 cm MITRAL VALVE MV Area (PHT): 4.63 cm    SHUNTS MV Area VTI:   3.63 cm    Systemic VTI:  0.16 m MV Peak grad:  4.2 mmHg    Systemic Diam: 2.50 cm MV Mean grad:  3.0 mmHg MV Vmax:       1.02 m/s MV Vmean:      75.9 cm/s MV Decel Time: 164 msec MV E velocity: 91.80 cm/s MV A velocity: 85.70 cm/s MV E/A ratio:  1.07 Eleonore Chiquito MD Electronically signed by Eleonore Chiquito MD Signature Date/Time: 08/04/2021/12:13:16 PM    Final    CT VENOGRAM ABD/PEL  Result Date: 08/03/2021 CLINICAL DATA:  Follow-up extensive DVT seen on ultrasound imaging. EXAM: CT VENOGRAM ABDOMEN AND PELVIS TECHNIQUE: Multidetector CT imaging of the abdomen and pelvis was performed using the standard protocol  following bolus administration of intravenous contrast. RADIATION DOSE REDUCTION: This exam was performed according to the departmental dose-optimization program which includes automated exposure control, adjustment of the mA and/or kV according to patient size and/or use of iterative reconstruction technique. CONTRAST:  69m OMNIPAQUE IOHEXOL 350 MG/ML SOLN COMPARISON:  November 16, 2016 FINDINGS: Lower chest: A moderate amount of intraluminal low attenuation is seen within the lower lobe branches of the right pulmonary artery. Hepatobiliary: No focal liver abnormality is seen. No gallstones, gallbladder wall thickening, or biliary dilatation. Pancreas: Unremarkable. No pancreatic ductal dilatation or surrounding inflammatory changes. Spleen: Normal in size without focal abnormality. Adrenals/Urinary Tract: Adrenal glands are unremarkable. Kidneys are normal, without renal calculi, focal lesion, or hydronephrosis. Bladder is unremarkable. Stomach/Bowel: Stomach is within normal limits. Appendix appears normal. No evidence of bowel wall thickening, distention, or inflammatory changes. Vascular/Lymphatic: The arterial structures within the abdomen pelvis are unremarkable. An extensive amount of intraluminal low attenuation is seen within the left  common iliac vein. This originates from its origin and extends throughout the left external iliac vein and left internal iliac vein, with progression to involve the visualized portion of the left common femoral vein and deep left femoral vein. Associated perivascular inflammatory fat stranding is seen. No enlarged abdominal or pelvic lymph nodes. Reproductive: Prostate is unremarkable. Other: No abdominal wall hernia or abnormality. No abdominopelvic ascites. Musculoskeletal: No acute or significant osseous findings. IMPRESSION: 1. Moderate amount of pulmonary embolism within the lower lobe branches of the RIGHT pulmonary artery. 2. Extensive amount of occlusive thrombus  within the LEFT common iliac vein, LEFT external iliac vein and LEFT internal iliac vein, with progression to involve the visualized portion of the LEFT common femoral vein and deep left femoral vein. Electronically Signed   By: Virgina Norfolk M.D.   On: 08/03/2021 22:20   DG Hip Unilat W or Wo Pelvis 2-3 Views Left  Result Date: 08/03/2021 CLINICAL DATA:  Left hip pain EXAM: DG HIP (WITH OR WITHOUT PELVIS) 2-3V LEFT COMPARISON:  None Available. FINDINGS: There is no evidence of hip fracture or dislocation. There is no evidence of arthropathy or other focal bone abnormality. IMPRESSION: Negative. Electronically Signed   By: Davina Poke D.O.   On: 08/03/2021 17:22   VAS [place] LOWER EXTREMITY VENOUS (DVT) (ONLY [facility] & [facility])  Result Date: 08/03/2021  Lower Venous DVT Study Patient Name:  DELEON PASSE  Date of Exam:   08/03/2021 Medical Rec #: [id number]         Accession #:    [id number] Date of Birth: Jul 28, 1990          Patient Gender: M Patient Age:   76 years Exam [facility]  [facility] Procedure:      VAS [place] LOWER EXTREMITY VENOUS (DVT) Referring Phys: LESLIE SOFIA --------------------------------------------------------------------------------  Indications: Pain left leg.  Comparison Study: No prior studies. Performing Technologist: Darlin Coco RDMS, RVT  Examination Guidelines: A complete evaluation includes B-mode imaging, spectral Doppler, color Doppler, and power Doppler as needed of all accessible portions of each vessel. Bilateral testing is considered an integral part of a complete examination. Limited examinations for reoccurring indications may be performed as noted. The reflux portion of the exam is performed with the patient in reverse Trendelenburg.  +-----+---------------+---------+-----------+----------+--------------+ RIGHTCompressibilityPhasicitySpontaneityPropertiesThrombus Aging +-----+---------------+---------+-----------+----------+--------------+ CFV  Full            Yes      Yes                                 +-----+---------------+---------+-----------+----------+--------------+   +---------+---------------+---------+-----------+----------+--------------+ LEFT     CompressibilityPhasicitySpontaneityPropertiesThrombus Aging +---------+---------------+---------+-----------+----------+--------------+ CFV      None           No       No                   Acute          +---------+---------------+---------+-----------+----------+--------------+ SFJ      None           No       No                   Acute          +---------+---------------+---------+-----------+----------+--------------+ FV Prox  None           No       No  Acute          +---------+---------------+---------+-----------+----------+--------------+ FV Mid   None           No       No                   Acute          +---------+---------------+---------+-----------+----------+--------------+ FV DistalNone           No       No                   Acute          +---------+---------------+---------+-----------+----------+--------------+ PFV      None           No       No                   Acute          +---------+---------------+---------+-----------+----------+--------------+ POP      None           No       No                   Acute          +---------+---------------+---------+-----------+----------+--------------+ PTV      None           No       No                   Acute          +---------+---------------+---------+-----------+----------+--------------+ PERO     None           No       No                   Acute          +---------+---------------+---------+-----------+----------+--------------+ Gastroc  None           No       No                   Acute          +---------+---------------+---------+-----------+----------+--------------+     Summary: RIGHT: - No evidence of common femoral vein obstruction.  LEFT:  - Findings consistent with acute deep vein thrombosis involving the left common femoral vein, SF junction, left femoral vein, left proximal profunda vein, left popliteal vein, left posterior tibial veins, left peroneal veins, and left gastrocnemius veins. - No cystic structure found in the popliteal fossa. - Possible obstruction proximal to the inguinal ligament.  *See table(s) above for measurements and observations. Electronically signed by Orlie Pollen on 08/03/2021 at 7:09:29 PM.    Final         Scheduled Meds:  LORazepam  0-4 mg Intravenous Q6H   Or   LORazepam  0-4 mg Oral Q6H   [START ON 08/06/2021] LORazepam  0-4 mg Intravenous Q12H   Or   [START ON 08/06/2021] LORazepam  0-4 mg Oral Q12H   thiamine  100 mg Oral Daily   Or   thiamine  100 mg Intravenous Daily   Continuous Infusions:  sodium chloride 50 mL/hr at 08/03/21 2327   heparin 1,700 Units/hr (08/04/21 1540)     LOS: 1 day   Time spent: 72mn  Kleigh Hoelzer C Cimone Fahey, DO [facility]  If 7PM-7AM, please contact night-coverage [email or web address]  08/04/2021, 5:06 PM

## 2021-08-04 NOTE — Progress Notes (Signed)
[place] for heparin Indication: DVT/PE  Allergies  Allergen Reactions   Tape Other (See Comments)    Medical tapes irritates the patient's skin   Tea Nausea And Vomiting    Vital Signs: BP: 106/61 (06/06 1430) Pulse Rate: 90 (06/06 1430)  Labs: Recent Labs    08/03/21 1659 08/04/21 0430 08/04/21 1328  HGB 8.6* 7.2*  --   HCT 31.8* 25.6*  --   PLT 448* 356  --   HEPARINUNFRC  --  <0.10* <0.10*  CREATININE 0.69 0.67  --      CrCl cannot be calculated (Unknown ideal weight.).   Medical History: Past Medical History:  Diagnosis Date   ADHD (attention deficit hyperactivity disorder)    Alcohol abuse    Alcohol withdrawal syndrome without complication (HCC)    Alcoholic ketoacidosis 11/01/8099   Anxiety    Asthma    Depression    suicidal in past   History of adenomatous polyp of colon 01/04/2019   Right clavicle fracture 11/2018    Assessment: 74 YOM presenting with leg pain, doppler with DVT, he is not on anticoagulation PTA  6/6 pm update:  Heparin level still undetectable on 1300 units/hr No bleeding or IV issues noted.    Goal of Therapy:  Heparin level 0.3-0.7 units/ml Monitor platelets by anticoagulation protocol: Yes   Plan:  Heparin 3000 units bolus Inc heparin to 1700 units/hr 6 hour heparin level  Erin Hearing PharmD., BCPS Clinical Pharmacist 08/04/2021 3:25 PM

## 2021-08-04 NOTE — ED Notes (Signed)
Contacted MD regarding Diet order

## 2021-08-04 NOTE — Progress Notes (Signed)
CT imaging last night revealed bilateral pulmonary emboli Leg remains painful, no phlegmasia I discussed the above with Dr. Avon Gully We will hold on left lower extremity thrombectomy at this time as the procedure can lead to further pulmonary embolism.  Recommend echo to assess for right heart strain prior to moving forward with lower extremity intervention.  I will continue to follow.  Should there be no right heart strain, possible left lower extremity thrombectomy tomorrow. Please make n.p.o. at midnight Patient with significant alcohol abuse history, recommend CIWA protocol  Mark John MD

## 2021-08-04 NOTE — Progress Notes (Addendum)
[place] for heparin Indication: DVT/PE  Allergies  Allergen Reactions   Tape Other (See Comments)    Medical tapes irritates the patient's skin   Tea Nausea And Vomiting    Vital Signs: Temp: 98.6 F (37 C) (06/05 2132) Temp Source: Oral (06/05 2132) BP: 111/70 (06/06 0400) Pulse Rate: 107 (06/06 0400)  Labs: Recent Labs    08/03/21 1659 08/04/21 0430  HGB 8.6* 7.2*  HCT 31.8* 25.6*  PLT 448* 356  HEPARINUNFRC  --  <0.10*  CREATININE 0.69 0.67     CrCl cannot be calculated (Unknown ideal weight.).   Medical History: Past Medical History:  Diagnosis Date   ADHD (attention deficit hyperactivity disorder)    Alcohol abuse    Alcohol withdrawal syndrome without complication (HCC)    Alcoholic ketoacidosis 11/03/1738   Anxiety    Asthma    Depression    suicidal in past   History of adenomatous polyp of colon 01/04/2019   Right clavicle fracture 11/2018    Assessment: 6 YOM presenting with leg pain, doppler with DVT, he is not on anticoagulation PTA  6/6 AM update:  Heparin level low Hgb down some-watch   Goal of Therapy:  Heparin level 0.3-0.7 units/ml Monitor platelets by anticoagulation protocol: Yes   Plan:  Heparin 3000 units bolus Inc heparin to 1300 units/hr 1300 heparin level Trend Hgb   Narda Bonds, PharmD, BCPS Clinical Pharmacist Phone: [phone]

## 2021-08-05 ENCOUNTER — Other Ambulatory Visit (HOSPITAL_COMMUNITY): Payer: Self-pay

## 2021-08-05 ENCOUNTER — Inpatient Hospital Stay (HOSPITAL_COMMUNITY)
Admission: EM | Disposition: A | Discharge status: Home / Self Care | Payer: Self-pay | Source: Home / Self Care | Attending: Internal Medicine

## 2021-08-05 DIAGNOSIS — I82402 Acute embolism and thrombosis of unspecified deep veins of left lower extremity: Secondary | ICD-10-CM

## 2021-08-05 DIAGNOSIS — D649 Anemia, unspecified: Secondary | ICD-10-CM

## 2021-08-05 DIAGNOSIS — F1014 Alcohol abuse with alcohol-induced mood disorder: Secondary | ICD-10-CM

## 2021-08-05 HISTORY — PX: IVC VENOGRAPHY: CATH118301

## 2021-08-05 HISTORY — PX: PERIPHERAL VASCULAR THROMBECTOMY: CATH118306

## 2021-08-05 HISTORY — PX: LOWER EXTREMITY VENOGRAPHY: CATH118253

## 2021-08-05 HISTORY — PX: INTRAVASCULAR ULTRASOUND/IVUS: CATH118244

## 2021-08-05 LAB — CBC
HCT: 25.5 % — ABNORMAL LOW (ref 39.0–52.0)
Hemoglobin: 7.3 g/dL — ABNORMAL LOW (ref 13.0–17.0)
MCH: 19.7 pg — ABNORMAL LOW (ref 26.0–34.0)
MCHC: 28.6 g/dL — ABNORMAL LOW (ref 30.0–36.0)
MCV: 68.9 fL — ABNORMAL LOW (ref 80.0–100.0)
Platelets: 408 10*3/uL — ABNORMAL HIGH (ref 150–400)
RBC: 3.7 MIL/uL — ABNORMAL LOW (ref 4.22–5.81)
RDW: 18 % — ABNORMAL HIGH (ref 11.5–15.5)
WBC: 10.9 10*3/uL — ABNORMAL HIGH (ref 4.0–10.5)
nRBC: 0 % (ref 0.0–0.2)

## 2021-08-05 LAB — HEPARIN LEVEL (UNFRACTIONATED): Heparin Unfractionated: 0.43 IU/mL (ref 0.30–0.70)

## 2021-08-05 LAB — SURGICAL PCR SCREEN
MRSA, PCR: NEGATIVE
Staphylococcus aureus: NEGATIVE

## 2021-08-05 LAB — ABO/RH: ABO/RH(D): AB POS

## 2021-08-05 SURGERY — PERIPHERAL VASCULAR THROMBECTOMY
Anesthesia: LOCAL

## 2021-08-05 MED ORDER — MIDAZOLAM HCL 2 MG/2ML IJ SOLN
INTRAMUSCULAR | Status: DC | PRN
  Administered 2021-08-05 (×2): 1 mg via INTRAVENOUS

## 2021-08-05 MED ORDER — FENTANYL CITRATE (PF) 100 MCG/2ML IJ SOLN
INTRAMUSCULAR | Status: AC
  Filled 2021-08-05: qty 2

## 2021-08-05 MED ORDER — FENTANYL CITRATE (PF) 100 MCG/2ML IJ SOLN
INTRAMUSCULAR | Status: DC | PRN
Start: 2021-08-05 — End: 2021-08-05
  Administered 2021-08-05 (×2): 50 ug via INTRAVENOUS

## 2021-08-05 MED ORDER — HEPARIN SODIUM (PORCINE) 1000 UNIT/ML IJ SOLN
INTRAMUSCULAR | Status: AC
  Filled 2021-08-05: qty 10

## 2021-08-05 MED ORDER — SODIUM CHLORIDE 0.9 % IV SOLN
250.0000 mg | Freq: Once | INTRAVENOUS | Status: AC
  Administered 2021-08-05: 250 mg via INTRAVENOUS
  Filled 2021-08-05: qty 20

## 2021-08-05 MED ORDER — IODIXANOL 320 MG/ML IV SOLN
INTRAVENOUS | Status: DC | PRN
  Administered 2021-08-05: 50 mL

## 2021-08-05 MED ORDER — OXYCODONE HCL 5 MG PO TABS
ORAL_TABLET | ORAL | Status: AC
  Filled 2021-08-05: qty 1

## 2021-08-05 MED ORDER — SODIUM CHLORIDE 0.9 % IV SOLN
INTRAVENOUS | Status: AC | PRN
  Administered 2021-08-05: 1000 mL via INTRAVENOUS

## 2021-08-05 MED ORDER — HEPARIN SODIUM (PORCINE) 1000 UNIT/ML IJ SOLN
INTRAMUSCULAR | Status: DC | PRN
  Administered 2021-08-05: 5000 [IU] via INTRAVENOUS

## 2021-08-05 MED ORDER — LIDOCAINE HCL (PF) 1 % IJ SOLN
INTRAMUSCULAR | Status: DC | PRN
  Administered 2021-08-05: 5 mL

## 2021-08-05 MED ORDER — HEPARIN (PORCINE) IN NACL 1000-0.9 UT/500ML-% IV SOLN
INTRAVENOUS | Status: AC
  Filled 2021-08-05: qty 2000

## 2021-08-05 MED ORDER — MIDAZOLAM HCL 2 MG/2ML IJ SOLN
INTRAMUSCULAR | Status: AC
  Filled 2021-08-05: qty 2

## 2021-08-05 MED ORDER — HYDROMORPHONE HCL 1 MG/ML IJ SOLN
INTRAMUSCULAR | Status: AC
  Filled 2021-08-05: qty 0.5

## 2021-08-05 MED ORDER — LIDOCAINE HCL (PF) 1 % IJ SOLN
INTRAMUSCULAR | Status: AC
  Filled 2021-08-05: qty 60

## 2021-08-05 SURGICAL SUPPLY — 11 items
CATH CLOT TRIEVER BOLD (CATHETERS) ×1 IMPLANT
CATH INFINITI VERT 5FR 125CM (CATHETERS) ×2 IMPLANT
CATH VISIONS PV .035 IVUS (CATHETERS) ×1 IMPLANT
GLIDEWIRE ADV .035X260CM (WIRE) ×1 IMPLANT
KIT MICROPUNCTURE NIT STIFF (SHEATH) ×1 IMPLANT
KIT PV (KITS) ×1 IMPLANT
SHEATH CLOT RETRIEVER (SHEATH) ×1 IMPLANT
SHEATH PINNACLE 8F 10CM (SHEATH) ×1 IMPLANT
SHEATH PROBE COVER 6X72 (BAG) ×1 IMPLANT
TRAY PV CATH (CUSTOM PROCEDURE TRAY) ×1 IMPLANT
WIRE G VAS 035X260 STIFF (WIRE) ×1 IMPLANT

## 2021-08-05 NOTE — Progress Notes (Signed)
Patient arrived from cath lab to [id number], patient placed on monitor and CCMD made aware. Vital signs obtained. Call bell within reach. Jonovan Boedecker, Bettina Gavia RN

## 2021-08-05 NOTE — Op Note (Addendum)
    Patient name: Mark Mcdaniel MRN: [id number] DOB: 05-30-1990 Sex: male  08/05/2021 Pre-operative Diagnosis: Left lower extremity iliofemoral deep venous thrombosis Post-operative diagnosis:  Same Surgeon:  Broadus John, MD Procedure Performed: 1.  Ultrasound-guided micropuncture access of the left popliteal vein 2.  Left lower extremity venogram 3.  IlioCaval venogram 4.  Percutaneous mechanical thrombectomy using Inari clot Treiver 5.  Intravascular ultrasound 6.  Access managed with Monocryl suture 7.  Moderate sedation time 45 minutes 8.  Contrast 50 mL   Indications:   This is a 31 y.o. male with extensive left lower extremity acute DVT extending from the common iliac vein through the tibial veins.  He does not have phlegmasia.  He is having significant pain.  No sensorimotor deficits.  I had a long conversation with Mark Mcdaniel and his mother regarding percutaneous thrombectomy in an effort to relieve the thrombotic burden and improve pain symptoms.  Findings:  Thrombus extending from the left popliteal vein access site through the left common iliac vein.  No caval thrombus appreciated   Procedure: Patient was brought to the OR laid prone.  The patient was prepped and draped in standard fashion and a timeout was performed.  I obtained access in the left popliteal vein using an ultrasound-guided micropuncture needle. From this access, a wire was run into the inferior vena cava.  An 8 French sheath was placed into the popliteal fossa and left leg venogram, cavogram followed.  This demonstrated thrombus throughout the left leg and left iliac system.  The cava demonstrated no thrombus.  An Amplatz wire was parked in the left subclavian vein, and the iliac confluence was marked using intravascular ultrasound.  Next, the Inari clot Mark Mcdaniel was brought onto the field.  The 16 French sheath was placed in the popliteal fossa and the clot Mark Mcdaniel was deployed in standard fashion with multiple  passes from the inferior vena cava through the popliteal vein in the 12 o'clock position, 3 o'clock position, 6 o'clock position, 9 o'clock position.  There was significant thrombotic return.  The last pass had very little thrombotic burden appreciated in the basket.  Follow-up venogram demonstrated excellent result with excellent flow through the left femoral and iliac system.  IVUS was brought onto the field to assess for iliac vein compression.  There was a moderate amount of compression appreciated, however I elected not to stent the lesion due to the patient's comorbidities including alcohol abuse, drug abuse, and concern for medication noncompliance.  I had a long discussion with him regarding the above.  Should he re-thrombose, he would require stenting.  My working diagnosis is provoked DVT from immobility over the last week.  He will need hematology follow-up for hypercoagulable work-up as well.   Cassandria Santee, MD [facility] Office: [phone]

## 2021-08-05 NOTE — Progress Notes (Signed)
ANTICOAGULATION CONSULT NOTE   Pharmacy Consult for heparin Indication: DVT/PE  Allergies  Allergen Reactions   Tape Other (See Comments)    Medical tapes irritates the patient's skin   Tea Nausea And Vomiting    Vital Signs: Temp: 98.3 F (36.8 C) (06/07 0000) Temp Source: Oral (06/07 0000) BP: 111/68 (06/07 0000) Pulse Rate: 82 (06/07 0000)  Labs: Recent Labs    08/03/21 1659 08/04/21 0430 08/04/21 1328 08/04/21 2303  HGB 8.6* 7.2*  --   --   HCT 31.8* 25.6*  --   --   PLT 448* 356  --   --   HEPARINUNFRC  --  <0.10* <0.10* 0.49  CREATININE 0.69 0.67  --   --      Estimated Creatinine Clearance: 107.1 mL/min (by C-G formula based on SCr of 0.67 mg/dL).   Medical History: Past Medical History:  Diagnosis Date   ADHD (attention deficit hyperactivity disorder)    Alcohol abuse    Alcohol withdrawal syndrome without complication (HCC)    Alcoholic ketoacidosis 10/02/4194   Anxiety    Asthma    Depression    suicidal in past   History of adenomatous polyp of colon 01/04/2019   Right clavicle fracture 11/2018    Assessment: 56 YOM presenting with leg pain, doppler with DVT, he is not on anticoagulation PTA  6/7 AM update:  Heparin level therapeutic    Goal of Therapy:  Heparin level 0.3-0.7 units/ml Monitor platelets by anticoagulation protocol: Yes   Plan:  Cont heparin 1700 units/hr Heparin level with AM labs Trend Hgb   Narda Bonds, PharmD, BCPS Clinical Pharmacist Phone: [phone]

## 2021-08-05 NOTE — Progress Notes (Signed)
PROGRESS NOTE        PATIENT DETAILS Name: Mark Mcdaniel Age: 31 y.o. Sex: male Date of Birth: August 10, 1990 Admit Date: 08/03/2021 Admitting Physician Kayleen Memos, DO XNA:[id number], [name], MD  Brief Summary: Mark Mcdaniel is a 31 y.o. caucasian male with medical history significant for polysubstance abuse including cocaine, THC, tobacco, alcohol use disorder who presented to [facility] ED from home with complaints of severe left lower extremity pain/swelling x2 days. Pt was diagnosed with Left lower extremity DVT on Doppler and CTA revealed bilateral PE without heart strain. Pt was admitted for further evaluation and work-up, started on heparin gtt and  vascular surgery consulted.  Significant event:  06/05 >>> DVT: pt adm with left leg pain and doppler shows left lower extremity DVT. 06/05 >>> Bilateral pulmonary embolism, CTA reveals bilateral PE without heart strain.  Significant Studies:  06/05 >> CTA chest: Bilateral lobar, segmental, and subsegmental pulmonary emboli with moderate-to-large thrombus.  06/05 >> DG Hip d/t left hip pain: no evidence of hip fracture or dislocation, or other focal bone abnormality.  06/05 >> VAS doppler LLE: acute deep vein thrombosis involving the left  common femoral vein, SF junction, left femoral vein, left proximal  profunda vein, left popliteal vein, left posterior tibial veins, left  peroneal veins, and left gastrocnemius  Veins.  06/06 >> ECHO: EF 55 - 60%, Normal biventricular function without  evidence of hemodynamically significant valvular heart disease.   Significant toxicology: Positive THC on admission   Subjective: Pt pleasant, sitting up in bed with mother present at bedside. Pt denies any chest pain or shortness of breath or difficulty breathing. Pt endorse pain to LLE.   Objective: Vitals: Blood pressure 118/76, pulse 70, temperature 98.5 F (36.9 C), temperature source Oral, resp. rate 14, weight  56.9 kg, SpO2 96 %.   Exam: Gen Exam: Alert, pleasant, awake and answer appropriately, not in any distress HEENT: neck supple, head atraumatic, normocephalic Chest: B/L clear to ascultation but diminished in bilateral bases CVS: S1 S2 regular Abdomen: + BS, soft non tender, non distended Extremities: MAE x4, painful and warm to touch to LLE; +1 pitting edema to LLE  Neurology: A&O x4, non focal Skin: no rash, some redness to LLE  Pertinent Labs/Radiology:    Latest Ref Rng & Units 08/05/2021    5:10 AM 08/04/2021    4:30 AM 08/03/2021    4:59 PM  CBC  WBC 4.0 - 10.5 K/uL 10.9   12.3   14.6    Hemoglobin 13.0 - 17.0 g/dL 7.3   7.2   8.6    Hematocrit 39.0 - 52.0 % 25.5   25.6   31.8    Platelets 150 - 400 K/uL 408   356   448      Lab Results  Component Value Date   NA 134 (L) 08/04/2021   K 3.7 08/04/2021   CL 105 08/04/2021   CO2 22 08/04/2021     Assessment and Plan:  Acute extensive left lower extremity DVT confirmed with doppler to LLE. Pt has risk factors including polysubstance abuse, tobacco abuse and sedentary lifestyle. -Continue heparin drip -Vascular surgery following and thrombectomy planned for today  Acute Bilateral PE confirmed by CTA  Doppler to LLE confirms extensive DVT -Continue heparin drip  Hypovolemic hyponatremia, mild Possible dehydration from decreased oral intake  -  continue IV hydration - Resume and increase oral intake and diet after procedure - repeat labs in the morning.    Alcohol use/abuse disorder Pt has hx of Tobacco abuse / Polysubstance abuse Including cocaine, UDS + for THC only Continues to smoke Drinks approximately 76 ounces of beer per day - continue CIWA protocol - continue multivitamin, folic acid and thiamine - provide counseling resources    Moderate protein calorie malnutrition Iron deficiency  Albumin 3.4, Likely secondary to alcohol/polysubstance abuse - Encourage oral diet intake when diet resumes  BMI Estimated  body mass index is 17.5 kg/m as calculated from the following:   Height as of 05/27/21: '5\' 11"'$  (1.803 m).   Weight as of this encounter: 56.9 kg.   Code status:   Code Status: Full Code   DVT Prophylaxis: Heparin gtt   Family Communication: Family at bedside  Disposition Plan: Status is: Inpatient Remains inpatient appropriate because: not medically ready   Planned Discharge Destination:Home  Diet: Diet Order             Diet NPO time specified Except for: Sips with Meds  Diet effective now                    MEDICATIONS: Scheduled Meds:  LORazepam  0-4 mg Intravenous Q6H   Or   LORazepam  0-4 mg Oral Q6H   [START ON 08/06/2021] LORazepam  0-4 mg Intravenous Q12H   Or   [START ON 08/06/2021] LORazepam  0-4 mg Oral Q12H   thiamine  100 mg Oral Daily   Or   thiamine  100 mg Intravenous Daily   Continuous Infusions:  sodium chloride 50 mL/hr at 08/05/21 0200   heparin 1,700 Units/hr (08/05/21 0206)   PRN Meds:.acetaminophen, HYDROmorphone (DILAUDID) injection, melatonin, oxyCODONE, polyethylene glycol   I have personally reviewed following labs and imaging studies  LABORATORY DATA: CBC: Recent Labs  Lab 08/03/21 1659 08/04/21 0430 08/05/21 0510  WBC 14.6* 12.3* 10.9*  NEUTROABS 11.6* 8.7*  --   HGB 8.6* 7.2* 7.3*  HCT 31.8* 25.6* 25.5*  MCV 71.6* 69.6* 68.9*  PLT 448* 356 408*    Basic Metabolic Panel: Recent Labs  Lab 08/03/21 1659 08/04/21 0430  NA 132* 134*  K 3.8 3.7  CL 100 105  CO2 20* 22  GLUCOSE 105* 105*  BUN 8 7  CREATININE 0.69 0.67  CALCIUM 8.9 8.3*  MG  --  2.0  PHOS  --  4.1    GFR: Estimated Creatinine Clearance: 108.7 mL/min (by C-G formula based on SCr of 0.67 mg/dL).  Liver Function Tests: Recent Labs  Lab 08/03/21 1659 08/04/21 0430  AST 14* 11*  ALT 10 7  ALKPHOS 67 50  BILITOT 0.8 0.6  PROT 7.6 6.3*  ALBUMIN 3.4* 2.8*   Anemia Panel: Recent Labs    08/04/21 0430  FERRITIN 11*  TIBC 358  IRON 9*     Urine analysis:    Component Value Date/Time   COLORURINE YELLOW 08/03/2021 2228   APPEARANCEUR CLEAR 08/03/2021 2228   LABSPEC 1.036 (H) 08/03/2021 2228   PHURINE 5.0 08/03/2021 2228   GLUCOSEU NEGATIVE 08/03/2021 2228   HGBUR NEGATIVE 08/03/2021 2228   BILIRUBINUR NEGATIVE 08/03/2021 2228   BILIRUBINUR small (A) 09/23/2015 1736   KETONESUR 80 (A) 08/03/2021 2228   PROTEINUR NEGATIVE 08/03/2021 2228   UROBILINOGEN 2.0 09/23/2015 1736   NITRITE NEGATIVE 08/03/2021 2228   LEUKOCYTESUR NEGATIVE 08/03/2021 2228    Sepsis Labs: Lactic Acid, Venous  Component Value Date/Time   LATICACIDVEN 1.0 08/03/2021 2228    MICROBIOLOGY: Recent Results (from the past 240 hour(s))  Culture, blood (routine x 2)     Status: None (Preliminary result)   Collection Time: 08/03/21  7:18 PM   Specimen: BLOOD LEFT ARM  Result Value Ref Range Status   Specimen Description BLOOD LEFT ARM  Final   Special Requests   Final    BOTTLES DRAWN AEROBIC AND ANAEROBIC Blood Culture adequate volume   Culture   Final    NO GROWTH 2 DAYS Performed at [facility], [address], [place], [place] [address]    Report Status PENDING  Incomplete  Culture, blood (routine x 2)     Status: None (Preliminary result)   Collection Time: 08/03/21  8:05 PM   Specimen: BLOOD RIGHT ARM  Result Value Ref Range Status   Specimen Description BLOOD RIGHT ARM  Final   Special Requests   Final    BOTTLES DRAWN AEROBIC AND ANAEROBIC Blood Culture adequate volume   Culture   Final    NO GROWTH 2 DAYS Performed at [facility], [place] [address] [place], [place] [address]    Report Status PENDING  Incomplete  Surgical pcr screen     Status: None   Collection Time: 08/05/21 12:25 AM   Specimen: Nasal Mucosa; Nasal Swab  Result Value Ref Range Status   MRSA, PCR NEGATIVE NEGATIVE Final   Staphylococcus aureus NEGATIVE NEGATIVE Final    Comment: (NOTE) The Xpert SA Assay (FDA approved for NASAL specimens  in patients 50 years of age and older), is one component of a comprehensive surveillance program. It is not intended to diagnose infection nor to guide or monitor treatment. Performed at [facility], [place] [address], [place], [place] [address]     RADIOLOGY STUDIES/RESULTS: CT Angio Chest PE W and/or Wo Contrast  Addendum Date: 08/03/2021   ADDENDUM REPORT: 08/03/2021 21:26 ADDENDUM: Critical findings were reported to Dr. Rayna Sexton at 9:25 p.m. Electronically Signed   By: Brett Fairy M.D.   On: 08/03/2021 21:26   Result Date: 08/03/2021 CLINICAL DATA:  Pulmonary embolism suspected, high probability. Tachycardia. EXAM: CT ANGIOGRAPHY CHEST WITH CONTRAST TECHNIQUE: Multidetector CT imaging of the chest was performed using the standard protocol during bolus administration of intravenous contrast. Multiplanar CT image reconstructions and MIPs were obtained to evaluate the vascular anatomy. RADIATION DOSE REDUCTION: This exam was performed according to the departmental dose-optimization program which includes automated exposure control, adjustment of the mA and/or kV according to patient size and/or use of iterative reconstruction technique. CONTRAST:  49m OMNIPAQUE IOHEXOL 350 MG/ML SOLN COMPARISON:  None Available. FINDINGS: Cardiovascular: Heart is normal in size and there is no pericardial effusion. The aorta and pulmonary trunk are normal in caliber. There are lobar, segmental, and subsegmental pulmonary artery filling defects bilaterally with moderate to large emboli burden. No evidence of right heart strain. Mediastinum/Nodes: No enlarged mediastinal, hilar, or axillary lymph nodes. Thyroid gland, trachea, and esophagus demonstrate no significant findings. Lungs/Pleura: Minimal apical pleural scarring is noted bilaterally. No consolidation, effusion, or pneumothorax. Upper Abdomen: No acute abnormality. Musculoskeletal: No acute osseous abnormality. Review of the MIP images confirms the  above findings. IMPRESSION: 1. Bilateral lobar, segmental, and subsegmental pulmonary emboli with moderate-to-large thrombus burden. No evidence of right heart strain. 2. No acute infiltrate in the lungs. Electronically Signed: By: LBrett FairyM.D. On: 08/03/2021 21:21   VAS [place]IVC/ILIAC (VENOUS ONLY)  Result Date: 08/03/2021 IVC/ILIAC STUDY Patient Name:  Mark Mcdaniel  Date of Exam:   08/03/2021 Medical Rec #: [id number]         Accession #:    [id number] Date of Birth: 05-11-1990          Patient Gender: Patient Age:   37 years Exam Location:  [facility] Procedure:      VAS [place] IVC/ILIAC (VENOUS ONLY) Referring Phys: --------------------------------------------------------------------------------  Indications: Extensive left lower extremity DVT with extension above inguinal              ligament Limitations: Air/bowel gas.  Comparison Study: 08-03-2021 Left lower extremity venous study. Performing Technologist: Darlin Coco RDMS, RVT  Examination Guidelines: A complete evaluation includes B-mode imaging, spectral Doppler, color Doppler, and power Doppler as needed of all accessible portions of each vessel. Bilateral testing is considered an integral part of a complete examination. Limited examinations for reoccurring indications may be performed as noted.  IVC/Iliac Findings: +----------+------+--------+--------+    IVC    PatentThrombusComments +----------+------+--------+--------+ IVC Mid   patent                 +----------+------+--------+--------+ IVC Distalpatent                 +----------+------+--------+--------+  +-------------------+---------+-----------+---------+-----------+--------+         CIV        RT-PatentRT-ThrombusLT-PatentLT-ThrombusComments +-------------------+---------+-----------+---------+-----------+--------+ Common Iliac Prox   patent                         acute             +-------------------+---------+-----------+---------+-----------+--------+ Common Iliac Mid    patent                         acute            +-------------------+---------+-----------+---------+-----------+--------+ Common Iliac Distal patent                         acute            +-------------------+---------+-----------+---------+-----------+--------+  +-------------------------+---------+-----------+---------+-----------+--------+            EIV           RT-PatentRT-ThrombusLT-PatentLT-ThrombusComments +-------------------------+---------+-----------+---------+-----------+--------+ External Iliac Vein Prox  patent                         acute            +-------------------------+---------+-----------+---------+-----------+--------+ External Iliac Vein Mid   patent                         acute            +-------------------------+---------+-----------+---------+-----------+--------+ External Iliac Vein       patent                         acute            Distal                                                                    +-------------------------+---------+-----------+---------+-----------+--------+   Summary:  IVC/Iliac: There is no evidence of thrombus involving the IVC. There is no evidence of thrombus involving the right common iliac vein. There is evidence of acute thrombus involving the left common iliac vein. There is no evidence of thrombus involving the right external iliac vein. There is evidence of acute thrombus involving the left external iliac vein. Visualization of proximal Inferior Vena Cava was limited.  *See table(s) above for measurements and observations.  Electronically signed by Orlie Pollen on 08/03/2021 at 7:09:53 PM.    Final    ECHOCARDIOGRAM COMPLETE  Result Date: 08/04/2021    ECHOCARDIOGRAM REPORT   Patient Name:   Mark Mcdaniel Date of Exam: 08/04/2021 Medical Rec #:  [id number]        Height:       71.0 in Accession  #:    [id number]       Weight:       135.0 lb Date of Birth:  [birth year]         BSA:          1.784 m Patient Age:    31 years         BP:           109/86 mmHg Patient Gender: M                HR:           101 bpm. Exam Location:  Inpatient Procedure: 2D Echo, Cardiac Doppler, Color Doppler and Strain Analysis Indications:    Pulmonary embolus  History:        Patient has no prior history of Echocardiogram examinations.  Sonographer:    [place] Referring Phys: [id number] [place]  1. Left ventricular ejection fraction, by estimation, is 55 to 60%. The left ventricle has normal function. The left ventricle has no regional wall motion abnormalities. Indeterminate diastolic filling due to E-A fusion.  2. Right ventricular systolic function is normal. The right ventricular size is normal. Tricuspid regurgitation signal is inadequate for assessing PA pressure.  3. The mitral valve is grossly normal. No evidence of mitral valve regurgitation. No evidence of mitral stenosis.  4. The aortic valve is tricuspid. Aortic valve regurgitation is not visualized. No aortic stenosis is present.  5. The inferior vena cava is normal in size with greater than 50% respiratory variability, suggesting right atrial pressure of 3 mmHg. Conclusion(s)/Recommendation(s): Normal biventricular function without evidence of hemodynamically significant valvular heart disease. FINDINGS  Left Ventricle: Left ventricular ejection fraction, by estimation, is 55 to 60%. The left ventricle has normal function. The left ventricle has no regional wall motion abnormalities. Global longitudinal strain performed but not reported based on interpreter judgement due to suboptimal tracking. The left ventricular internal cavity size was normal in size. There is no left ventricular hypertrophy. Indeterminate diastolic filling due to E-A fusion. Right Ventricle: The right ventricular size is normal. No increase in right ventricular wall  thickness. Right ventricular systolic function is normal. Tricuspid regurgitation signal is inadequate for assessing PA pressure. Left Atrium: Left atrial size was normal in size. Right Atrium: Right atrial size was normal in size. Pericardium: There is no evidence of pericardial effusion. Mitral Valve: The mitral valve is grossly normal. No evidence of mitral valve regurgitation. No evidence of mitral valve stenosis. MV peak gradient, 4.2 mmHg. The mean mitral valve gradient is 3.0 mmHg. Tricuspid Valve: The tricuspid valve is grossly normal. Tricuspid valve regurgitation is not demonstrated. No evidence of tricuspid stenosis.  Aortic Valve: The aortic valve is tricuspid. Aortic valve regurgitation is not visualized. No aortic stenosis is present. Aortic valve mean gradient measures 3.0 mmHg. Aortic valve peak gradient measures 6.6 mmHg. Aortic valve area, by VTI measures 4.11 cm. Pulmonic Valve: The pulmonic valve was grossly normal. Pulmonic valve regurgitation is not visualized. No evidence of pulmonic stenosis. Aorta: The aortic root and ascending aorta are structurally normal, with no evidence of dilitation. Venous: The inferior vena cava is normal in size with greater than 50% respiratory variability, suggesting right atrial pressure of 3 mmHg. IAS/Shunts: The atrial septum is grossly normal.  LEFT VENTRICLE PLAX 2D LVIDd:         4.90 cm     Diastology LVIDs:         3.20 cm     LV e' medial:    11.10 cm/s LV PW:         1.00 cm     LV E/e' medial:  8.3 LV IVS:        0.90 cm     LV e' lateral:   14.00 cm/s LVOT diam:     2.50 cm     LV E/e' lateral: 6.6 LV SV:         79 LV SV Index:   44 LVOT Area:     4.91 cm  LV Volumes (MOD) LV vol d, MOD A2C: 91.1 ml LV vol d, MOD A4C: 83.1 ml LV vol s, MOD A2C: 48.4 ml LV vol s, MOD A4C: 32.4 ml LV SV MOD A2C:     42.7 ml LV SV MOD A4C:     83.1 ml LV SV MOD BP:      46.6 ml RIGHT VENTRICLE RV Basal diam:  4.00 cm RV Mid diam:    3.20 cm LEFT ATRIUM           Index         RIGHT ATRIUM          Index LA Vol (A4C): 18.5 ml 10.37 ml/m  RA Area:     6.96 cm                                    RA Volume:   10.90 ml 6.11 ml/m  AORTIC VALVE                    PULMONIC VALVE AV Area (Vmax):    4.26 cm     PV Vmax:       1.18 m/s AV Area (Vmean):   4.16 cm     PV Vmean:      84.700 cm/s AV Area (VTI):     4.11 cm     PV VTI:        0.203 m AV Vmax:           128.00 cm/s  PV Peak grad:  5.6 mmHg AV Vmean:          84.500 cm/s  PV Mean grad:  3.0 mmHg AV VTI:            0.192 m AV Peak Grad:      6.6 mmHg AV Mean Grad:      3.0 mmHg LVOT Vmax:         111.00 cm/s LVOT Vmean:        71.600 cm/s LVOT VTI:  0.160 m LVOT/AV VTI ratio: 0.84  AORTA Ao Root diam: 3.10 cm Ao Asc diam:  3.20 cm MITRAL VALVE MV Area (PHT): 4.63 cm    SHUNTS MV Area VTI:   3.63 cm    Systemic VTI:  0.16 m MV Peak grad:  4.2 mmHg    Systemic Diam: 2.50 cm MV Mean grad:  3.0 mmHg MV Vmax:       1.02 m/s MV Vmean:      75.9 cm/s MV Decel Time: 164 msec MV E velocity: 91.80 cm/s MV A velocity: 85.70 cm/s MV E/A ratio:  1.07 Eleonore Chiquito MD Electronically signed by Eleonore Chiquito MD Signature Date/Time: 08/04/2021/12:13:16 PM    Final    CT VENOGRAM ABD/PEL  Result Date: 08/03/2021 CLINICAL DATA:  Follow-up extensive DVT seen on ultrasound imaging. EXAM: CT VENOGRAM ABDOMEN AND PELVIS TECHNIQUE: Multidetector CT imaging of the abdomen and pelvis was performed using the standard protocol following bolus administration of intravenous contrast. RADIATION DOSE REDUCTION: This exam was performed according to the departmental dose-optimization program which includes automated exposure control, adjustment of the mA and/or kV according to patient size and/or use of iterative reconstruction technique. CONTRAST:  30m OMNIPAQUE IOHEXOL 350 MG/ML SOLN COMPARISON:  November 16, 2016 FINDINGS: Lower chest: A moderate amount of intraluminal low attenuation is seen within the lower lobe branches of the right pulmonary  artery. Hepatobiliary: No focal liver abnormality is seen. No gallstones, gallbladder wall thickening, or biliary dilatation. Pancreas: Unremarkable. No pancreatic ductal dilatation or surrounding inflammatory changes. Spleen: Normal in size without focal abnormality. Adrenals/Urinary Tract: Adrenal glands are unremarkable. Kidneys are normal, without renal calculi, focal lesion, or hydronephrosis. Bladder is unremarkable. Stomach/Bowel: Stomach is within normal limits. Appendix appears normal. No evidence of bowel wall thickening, distention, or inflammatory changes. Vascular/Lymphatic: The arterial structures within the abdomen pelvis are unremarkable. An extensive amount of intraluminal low attenuation is seen within the left common iliac vein. This originates from its origin and extends throughout the left external iliac vein and left internal iliac vein, with progression to involve the visualized portion of the left common femoral vein and deep left femoral vein. Associated perivascular inflammatory fat stranding is seen. No enlarged abdominal or pelvic lymph nodes. Reproductive: Prostate is unremarkable. Other: No abdominal wall hernia or abnormality. No abdominopelvic ascites. Musculoskeletal: No acute or significant osseous findings. IMPRESSION: 1. Moderate amount of pulmonary embolism within the lower lobe branches of the RIGHT pulmonary artery. 2. Extensive amount of occlusive thrombus within the LEFT common iliac vein, LEFT external iliac vein and LEFT internal iliac vein, with progression to involve the visualized portion of the LEFT common femoral vein and deep left femoral vein. Electronically Signed   By: TVirgina NorfolkM.D.   On: 08/03/2021 22:20   DG Hip Unilat W or Wo Pelvis 2-3 Views Left  Result Date: 08/03/2021 CLINICAL DATA:  Left hip pain EXAM: DG HIP (WITH OR WITHOUT PELVIS) 2-3V LEFT COMPARISON:  None Available. FINDINGS: There is no evidence of hip fracture or dislocation. There is no  evidence of arthropathy or other focal bone abnormality. IMPRESSION: Negative. Electronically Signed   By: NDavina PokeD.O.   On: 08/03/2021 17:22   VAS [place]OWER EXTREMITY VENOUS (DVT) (ONLY [facility] & [facility])  Result Date: 08/03/2021  Lower Venous DVT Study Patient Name:  Mark Mcdaniel Date of Exam:   08/03/2021 Medical Rec #: [id number]        Accession #:    [id number] of  Birth: 1990-05-17          Patient Gender: M Patient Age:   45 years Exam Location[facility]  [facility] Procedure:      VAS [place] LOWER EXTREMITY VENOUS (DVT) Referring Phys: LESLIE SOFIA --------------------------------------------------------------------------------  Indications: Pain left leg.  Comparison Study: No prior studies. Performing Technologist: Darlin Coco RDMS, RVT  Examination Guidelines: A complete evaluation includes B-mode imaging, spectral Doppler, color Doppler, and power Doppler as needed of all accessible portions of each vessel. Bilateral testing is considered an integral part of a complete examination. Limited examinations for reoccurring indications may be performed as noted. The reflux portion of the exam is performed with the patient in reverse Trendelenburg.  +-----+---------------+---------+-----------+----------+--------------+ RIGHTCompressibilityPhasicitySpontaneityPropertiesThrombus Aging +-----+---------------+---------+-----------+----------+--------------+ CFV  Full           Yes      Yes                                 +-----+---------------+---------+-----------+----------+--------------+   +---------+---------------+---------+-----------+----------+--------------+ LEFT     CompressibilityPhasicitySpontaneityPropertiesThrombus Aging +---------+---------------+---------+-----------+----------+--------------+ CFV      None           No       No                   Acute          +---------+---------------+---------+-----------+----------+--------------+ SFJ      None           No        No                   Acute          +---------+---------------+---------+-----------+----------+--------------+ FV Prox  None           No       No                   Acute          +---------+---------------+---------+-----------+----------+--------------+ FV Mid   None           No       No                   Acute          +---------+---------------+---------+-----------+----------+--------------+ FV DistalNone           No       No                   Acute          +---------+---------------+---------+-----------+----------+--------------+ PFV      None           No       No                   Acute          +---------+---------------+---------+-----------+----------+--------------+ POP      None           No       No                   Acute          +---------+---------------+---------+-----------+----------+--------------+ PTV      None           No       No  Acute          +---------+---------------+---------+-----------+----------+--------------+ PERO     None           No       No                   Acute          +---------+---------------+---------+-----------+----------+--------------+ Gastroc  None           No       No                   Acute          +---------+---------------+---------+-----------+----------+--------------+     Summary: RIGHT: - No evidence of common femoral vein obstruction.  LEFT: - Findings consistent with acute deep vein thrombosis involving the left common femoral vein, SF junction, left femoral vein, left proximal profunda vein, left popliteal vein, left posterior tibial veins, left peroneal veins, and left gastrocnemius veins. - No cystic structure found in the popliteal fossa. - Possible obstruction proximal to the inguinal ligament.  *See table(s) above for measurements and observations. Electronically signed by Orlie Pollen on 08/03/2021 at 7:09:29 PM.    Final      LOS: 2 days   Tanda Rockers, AG-ACNP Student   [facility]   08/05/2021, 10:05 AM

## 2021-08-05 NOTE — Progress Notes (Signed)
  Daily Progress Note   Subjective: No complaints  Objective: Vitals:   08/05/21 0800 08/05/21 1050  BP: 118/76   Pulse: 70   Resp: 14   Temp: 98.5 F (36.9 C)   SpO2: 96% 100%    Physical Examination 2+ pulses No phlegmasia   ASSESSMENT/PLAN:  Pt with left iliofemoral DVT. No right heart strain.  After discussing the risk and benefits of mechanical thrombectomy to decrease thrombotic burden, Mark Mcdaniel elected to proceed.    Cassandria Santee MD MS [facility] [phone] 08/05/2021  11:12 AM

## 2021-08-05 NOTE — Progress Notes (Addendum)
ANTICOAGULATION CONSULT NOTE   Pharmacy Consult for heparin Indication: DVT/PE  Allergies  Allergen Reactions   Tape Other (See Comments)    Medical tapes irritates the patient's skin   Tea Nausea And Vomiting    Vital Signs: Temp: 98.5 F (36.9 C) (06/07 0800) Temp Source: Oral (06/07 0800) BP: 118/76 (06/07 0800) Pulse Rate: 70 (06/07 0800)  Labs: Recent Labs    08/03/21 1659 08/03/21 1659 08/04/21 0430 08/04/21 1328 08/04/21 2303 08/05/21 0510  HGB 8.6*  --  7.2*  --   --  7.3*  HCT 31.8*  --  25.6*  --   --  25.5*  PLT 448*  --  356  --   --  408*  HEPARINUNFRC  --    < > <0.10* <0.10* 0.49 0.43  CREATININE 0.69  --  0.67  --   --   --    < > = values in this interval not displayed.     Estimated Creatinine Clearance: 108.7 mL/min (by C-G formula based on SCr of 0.67 mg/dL).   Medical History: Past Medical History:  Diagnosis Date   ADHD (attention deficit hyperactivity disorder)    Alcohol abuse    Alcohol withdrawal syndrome without complication (HCC)    Alcoholic ketoacidosis 09/29/2749   Anxiety    Asthma    Depression    suicidal in past   History of adenomatous polyp of colon 01/04/2019   Right clavicle fracture 11/2018    Assessment: 38 YOM presenting on 08/03/21 with leg pain, doppler with acute LLE DVT, he is not on anticoagulation PTA.   Also CT imaging for bilateral pulmonary emboli.   No right heart strain.   6/7 AM update:   HL = 0.43 on heparin infusion 1700 units/hr.  Heparin level therapeutic.  Hgb 8.6 on admit >7.2>7.3, pltc 408k. Patient now in OR this morning  for LLE  mechanical thrombectomy to decrease thrombotic burden.   Goal of Therapy:  Heparin level 0.3-0.7 units/ml Monitor platelets by anticoagulation protocol: Yes   Plan:  Continue IV  heparin 1700 units/hr. Heparin level  and CBC daily while on IV heparin Follow up for anticoagulation plans post LLE thrombectomy today.   Nicole Cella, RPh Clinical Pharmacist Please  check AMION for all [place] phone numbers After 10:00 PM, call [place] [phone] 08/05/2021 11:09 AM    ADDENDUM:  S/p post thrombectomy  08/05/21 . Heparin infusion not stopped prior to procedure and RN reports heparin infusing at 1700 ut/hr when arrived on 4E.  No bleeding or hematoma noted  HL therapeutic x2 on hep 1700 units/hr.   Give Ferrlecit 250 mg IV x1 6/7 post thrombectomy as directed by Dr. Sloan Leiter for iron deficiency anemia.  Plan: Continue Heparin 1700 units/hr Daily HL and CBC Monitor for s/sx of bleeding or hematoma.  Nicole Cella, RPh Clinical Pharmacist 08/05/2021 4:28 PM Please check AMION for all [place] phone numbers After 10:00 PM, call [place] [phone] .

## 2021-08-06 ENCOUNTER — Encounter (HOSPITAL_COMMUNITY): Payer: Self-pay | Admitting: Vascular Surgery

## 2021-08-06 DIAGNOSIS — E538 Deficiency of other specified B group vitamins: Secondary | ICD-10-CM

## 2021-08-06 DIAGNOSIS — K625 Hemorrhage of anus and rectum: Secondary | ICD-10-CM

## 2021-08-06 DIAGNOSIS — Z86010 Personal history of colonic polyps: Secondary | ICD-10-CM

## 2021-08-06 DIAGNOSIS — I2699 Other pulmonary embolism without acute cor pulmonale: Secondary | ICD-10-CM

## 2021-08-06 DIAGNOSIS — D508 Other iron deficiency anemias: Secondary | ICD-10-CM

## 2021-08-06 DIAGNOSIS — Z7901 Long term (current) use of anticoagulants: Secondary | ICD-10-CM

## 2021-08-06 DIAGNOSIS — K649 Unspecified hemorrhoids: Secondary | ICD-10-CM

## 2021-08-06 LAB — HEPARIN LEVEL (UNFRACTIONATED): Heparin Unfractionated: 0.32 IU/mL (ref 0.30–0.70)

## 2021-08-06 LAB — CBC
HCT: 23.4 % — ABNORMAL LOW (ref 39.0–52.0)
Hemoglobin: 6.6 g/dL — CL (ref 13.0–17.0)
MCH: 19.6 pg — ABNORMAL LOW (ref 26.0–34.0)
MCHC: 28.2 g/dL — ABNORMAL LOW (ref 30.0–36.0)
MCV: 69.4 fL — ABNORMAL LOW (ref 80.0–100.0)
Platelets: 427 10*3/uL — ABNORMAL HIGH (ref 150–400)
RBC: 3.37 MIL/uL — ABNORMAL LOW (ref 4.22–5.81)
RDW: 17.9 % — ABNORMAL HIGH (ref 11.5–15.5)
WBC: 13.3 10*3/uL — ABNORMAL HIGH (ref 4.0–10.5)
nRBC: 0 % (ref 0.0–0.2)

## 2021-08-06 LAB — PREPARE RBC (CROSSMATCH)

## 2021-08-06 LAB — VITAMIN B12: Vitamin B-12: 241 pg/mL (ref 180–914)

## 2021-08-06 LAB — HEMOGLOBIN AND HEMATOCRIT, BLOOD
HCT: 27 % — ABNORMAL LOW (ref 39.0–52.0)
Hemoglobin: 8 g/dL — ABNORMAL LOW (ref 13.0–17.0)

## 2021-08-06 LAB — FOLATE: Folate: 13.5 ng/mL (ref >5.9)

## 2021-08-06 MED ORDER — PANTOPRAZOLE SODIUM 40 MG PO TBEC
40.0000 mg | DELAYED_RELEASE_TABLET | Freq: Every day | ORAL | Status: DC
  Administered 2021-08-06 – 2021-08-07 (×2): 40 mg via ORAL
  Filled 2021-08-06 (×2): qty 1

## 2021-08-06 MED ORDER — SODIUM CHLORIDE 0.9% IV SOLUTION: Freq: Once | INTRAVENOUS | Status: AC

## 2021-08-06 MED ORDER — CYANOCOBALAMIN 1000 MCG/ML IJ SOLN
1000.0000 ug | Freq: Every day | INTRAMUSCULAR | Status: DC
Start: 2021-08-06 — End: 2021-08-07
  Administered 2021-08-07: 1000 ug via SUBCUTANEOUS
  Filled 2021-08-06 (×2): qty 1

## 2021-08-06 MED FILL — Heparin Sod (Porcine)-NaCl IV Soln 1000 Unit/500ML-0.9%: INTRAVENOUS | Qty: 2000 | Status: AC

## 2021-08-06 NOTE — Progress Notes (Addendum)
HGB 6.6 down from 7.3 yesterday.  No signs of active bleeding, just had thrombectomy yesterday (suspect this is source of HGB drop).  1u PRBC transfusion ordered, H/H post transfusion.

## 2021-08-06 NOTE — Progress Notes (Addendum)
    Progress Note    08/06/2021 8:22 AM 1 Day Post-Op  Subjective:  left thigh and behind knee sore   Vitals:   08/06/21 0715 08/06/21 0808  BP: 125/73 126/77  Pulse: 97 100  Resp:  18  Temp: 98.1 F (36.7 C) 98 F (36.7 C)  SpO2: 98% 94%   Physical Exam: Cardiac:  regular Lungs:  non labored Extremities:  LLE with ACE wrap, palpable DP pulse Neurologic: alert and oriented  CBC    Component Value Date/Time   WBC 13.3 (H) 08/06/2021 0054   RBC 3.37 (L) 08/06/2021 0054   HGB 6.6 (LL) 08/06/2021 0054   HCT 23.4 (L) 08/06/2021 0054   PLT 427 (H) 08/06/2021 0054   MCV 69.4 (L) 08/06/2021 0054   MCH 19.6 (L) 08/06/2021 0054   MCHC 28.2 (L) 08/06/2021 0054   RDW 17.9 (H) 08/06/2021 0054   LYMPHSABS 1.9 08/04/2021 0430   MONOABS 1.5 (H) 08/04/2021 0430   EOSABS 0.2 08/04/2021 0430   BASOSABS 0.0 08/04/2021 0430    BMET    Component Value Date/Time   NA 134 (L) 08/04/2021 0430   K 3.7 08/04/2021 0430   CL 105 08/04/2021 0430   CO2 22 08/04/2021 0430   GLUCOSE 105 (H) 08/04/2021 0430   BUN 7 08/04/2021 0430   CREATININE 0.67 08/04/2021 0430   CREATININE 0.85 09/23/2015 1720   CALCIUM 8.3 (L) 08/04/2021 0430   GFRNONAA >60 08/04/2021 0430   GFRAA >60 07/08/2018 0759    INR No results found for: "INR"   Intake/Output Summary (Last 24 hours) at 08/06/2021 8366 Last data filed at 08/06/2021 0719 Gross per 24 hour  Intake 2432.47 ml  Output 1300 ml  Net 1132.47 ml     Assessment/Plan:  31 y.o. male is s/p LLE venogram, iliocaval venogram, percutaneous mechanical thrombectomy with Inari 1 Day Post-Op   LLE is is well perfused and warm. Continue ACE dressings and elevation Will place order for thigh high compression stocking Hgb 6.6 this morning. Chronic anemia. Primary team ordered I unit PRBC On IV heparin WIll need to transition to [place] on d/c. Per primary may need GI workup prior to starting DOAC with history of hemorrhoids and GI bleed? Will arrange  follow up in 1 month with IVC/iliac and LLE venous duplex  Karoline Caldwell, PA-C [facility] [phone] 08/06/2021 8:22 AM  VASCULAR STAFF ADDENDUM: I have independently interviewed and examined the patient. I agree with the above.  Doing well this morning, had a long discussion regarding anticoagulation, the need to turn his life around. DOAC when able. Estimated EBL 400 mL-recommend repeating labs to ensure no continued bleeding, if so, will need GI work-up. We will see him in 1 month with repeat studies Patient will benefit from 20 to 30 mmHg thigh-high compression stocking to the left leg, continued elevation when able   J. Melene Muller, MD [facility] Phone Number: [phone] 08/06/2021 11:30 AM

## 2021-08-06 NOTE — Progress Notes (Signed)
ANTICOAGULATION CONSULT NOTE   Pharmacy Consult for heparin Indication: DVT/PE  Allergies  Allergen Reactions   Tape Other (See Comments)    Medical tapes irritates the patient's skin   Tea Nausea And Vomiting    Vital Signs: Temp: 98 F (36.7 C) (06/08 0808) Temp Source: Oral (06/08 0808) BP: 126/77 (06/08 0808) Pulse Rate: 100 (06/08 0808)  Labs: Recent Labs    08/03/21 1659 08/04/21 0430 08/04/21 1328 08/04/21 2303 08/05/21 0510 08/06/21 0054 08/06/21 0825  HGB 8.6* 7.2*  --   --  7.3* 6.6* 8.0*  HCT 31.8* 25.6*  --   --  25.5* 23.4* 27.0*  PLT 448* 356  --   --  408* 427*  --   HEPARINUNFRC  --  <0.10*   < > 0.49 0.43 0.32  --   CREATININE 0.69 0.67  --   --   --   --   --    < > = values in this interval not displayed.     Estimated Creatinine Clearance: 108.7 mL/min (by C-G formula based on SCr of 0.67 mg/dL).   Medical History: Past Medical History:  Diagnosis Date   ADHD (attention deficit hyperactivity disorder)    Alcohol abuse    Alcohol withdrawal syndrome without complication (HCC)    Alcoholic ketoacidosis 0/09/6759   Anxiety    Asthma    Depression    suicidal in past   History of adenomatous polyp of colon 01/04/2019   Right clavicle fracture 11/2018    Assessment: 38 YOM presenting on 08/03/21 with leg pain, doppler with acute LLE DVT, he is not on anticoagulation PTA.   Also CT imaging for bilateral pulmonary emboli. No right heart strain.   S/p mechanical thrombectomy with Inari 6/7, heparin has been continued. Heparin level at low end of therapeutic goal this morning on 1700 units/hr. No bleeding noted however hemoglobin down to 6.6 overnight, now s/p 1 unit and hemoglobin up to 8.   Goal of Therapy:  Heparin level 0.3-0.7 units/ml Monitor platelets by anticoagulation protocol: Yes   Plan:  Continue IV  heparin 1700 units/hr. Heparin level  and CBC daily while on IV heparin  Erin Hearing PharmD., BCPS Clinical  Pharmacist 08/06/2021 8:53 AM

## 2021-08-06 NOTE — Discharge Instructions (Addendum)
Discharge Instructions for DVT (Deep Vein Thrombosis): Elevate lower extremity above level of heart daily Wear thigh high compression stockings as instructed Make sure to take your blood thinning medication daily as prescribed  Call 911 or visit the nearest emergency room if you:  Sudden chest tightness or pain. Sharp pain when taking a deep breath. Cough with bloody mucus. Sudden shortness of breath or fast breathing. A fast heart rate. Cold skin, clammy skin, or sweating. New swelling in the arms or legs.  You will have follow up with your Vascular provider in 1 month with an ultrasound   _______________________________________________________________________________________________________ Information on my medicine - ELIQUIS (apixaban)  This medication education was reviewed with me or my healthcare representative as part of my discharge preparation.   Why was Eliquis prescribed for you? Eliquis was prescribed to treat blood clots that may have been found in the veins of your legs (deep vein thrombosis) or in your lungs (pulmonary embolism) and to reduce the risk of them occurring again.  What do You need to know about Eliquis ? The starting dose is 10 mg (two 5 mg tablets) taken TWICE daily for the FIRST SEVEN (7) DAYS, then on 08/14/2021  the dose is reduced to ONE 5 mg tablet taken TWICE daily.  Eliquis may be taken with or without food.   Try to take the dose about the same time in the morning and in the evening. If you have difficulty swallowing the tablet whole please discuss with your pharmacist how to take the medication safely.  Take Eliquis exactly as prescribed and DO NOT stop taking Eliquis without talking to the doctor who prescribed the medication.  Stopping may increase your risk of developing a new blood clot.  Refill your prescription before you run out.  After discharge, you should have regular check-up appointments with your healthcare provider that is  prescribing your Eliquis.    What do you do if you miss a dose? If a dose of ELIQUIS is not taken at the scheduled time, take it as soon as possible on the same day and twice-daily administration should be resumed. The dose should not be doubled to make up for a missed dose.  Important Safety Information A possible side effect of Eliquis is bleeding. You should call your healthcare provider right away if you experience any of the following: Bleeding from an injury or your nose that does not stop. Unusual colored urine (red or dark brown) or unusual colored stools (red or black). Unusual bruising for unknown reasons. A serious fall or if you hit your head (even if there is no bleeding).  Some medicines may interact with Eliquis and might increase your risk of bleeding or clotting while on Eliquis. To help avoid this, consult your healthcare provider or pharmacist prior to using any new prescription or non-prescription medications, including herbals, vitamins, non-steroidal anti-inflammatory drugs (NSAIDs) and supplements.  This website has more information on Eliquis (apixaban): http://www.eliquis.com/eliquis/home

## 2021-08-06 NOTE — Consult Note (Signed)
Referring Provider: Dr. Oren Binet Primary Care Physician:  Enid Skeens., MD Primary Gastroenterologist:  Dr. Carlean Purl  Reason for Consultation: Iron deficiency anemia, rectal bleeding  HPI: Mark Mcdaniel is a 31 y.o. male with a past medical history of asthma, anxiety, depression, ADHD, alcohol use disorder and colon polyps.  He presented to [facility] ED 08/03/2021 for further evaluation regarding left groin and upper leg pain which progressively worsened over the past 2 weeks.  No known injury. Labs in the ED showed a WBC count 14.6.  Hemoglobin 8.6 (Hg 11.6 on 02/08/2021).  Hematocrit 31.8.  MCV 71.6.  Platelet 448.  Doppler study identified an acute DVT to the left common femoral vein, SF junction, left femoral vein, left proximal profunda vein, left popliteal vein, left posterior tibial veins, left peroneal veins, and left gastrocnemius veins.  Chest CT showed bilateral pulmonary emboli with moderate-to-large thrombus burden. No evidence of right heart strain.  He was started on IV heparin.  He was evaluated by vascular surgery, s/p percutaneous venous thrombectomy 6/7.  In the ED, he was febrile with a temperature of 101.82F.  Tachycardic with heart rate 105.  Blood cultures no growth at 2 days.  Afebrile today.  Hg dropped today to 6.6. No overt GI bleeding since admission.  A GI consult was requested for further evaluation regarding microcytic anemia in the setting of requiring anticoagulation for his DVT/PE.  He denies having any dysphagia or heartburn.  No upper or lower abdominal pain.  No nausea or vomiting.  He typically passes 1 or 2 normal brown stools daily.  No melena.  He sees a small amount of bright red blood on the toilet tissue 2 to 3 days weekly which is an improvement when compared to the rectal bleeding he experienced prior to his hemorrhoid banding in 2020.  He underwent flexible sigmoidoscopy Dr. Carlean Purl 12/2018 which identified internal and external  hemorrhoids and a small tubular adenomatous polyp was removed from the sigmoid colon.  A full colonoscopy was completed 02/16/2019 which identified 1 hyperplastic polyp removed from proximal sigmoid colon with evidence of mucosal ulceration in the rectum from his prior hemorrhoidal banding.  He took Ibuprofen 600 mg twice daily for 3 days for his left leg pain prior to and admitted to the hospital.  He typically avoids NSAIDs.  He drinks 2-3 (19oz) loose days, sometimes every other day.  No alcohol for the past 5 days.  He intends to remain abstinent from alcohol.  Decreased appetite for the past 5 days.  Typically eats 3 meals daily.  He lost 2 or 3 pounds over the past week.  No fevers.  Past marijuana use.  No known family history of IBD.  Cousin with history of colon cancer. Mother is at the bedside.  PAST GI PROCEDURES:  Colonoscopy 02/16/2019: Deformed coccyx found on perianal exam. - Mobile coccyx found on digital rectal exam. - One 1 to 2 mm hyperplastic polyp in the proximal sigmoid colon, removed with a cold biopsy forceps. Resected and retrieved. - Internal hemorrhoids. - Mucosal ulceration. in rectum from recent hemorrhoid liigation left lateral - The examination was otherwise normal on direct and retroflexion views. -Recall colonoscopy 7 years  Flexible sigmoidoscopy 01/04/2019: Bony prominence proximal coccyx found on perianal exam. - Mobile distal coccyx found on digital rectal exam. - External and internal hemorrhoids. Anoscopy also performed - One diminutive tubular adenomatous polyp in the sigmoid colon, removed with a cold snare. Resected and retrieved. - The  examination was otherwise normal. into right colon though prep not adequate there  Past Medical History:  Diagnosis Date   ADHD (attention deficit hyperactivity disorder)    Alcohol abuse    Alcohol withdrawal syndrome without complication ([place])    Alcoholic ketoacidosis 05/07/4663   Anxiety    Asthma    Depression     suicidal in past   History of adenomatous polyp of colon 01/04/2019   Right clavicle fracture 11/2018    Past Surgical History:  Procedure Laterality Date   HERNIA REPAIR     Performed when 6 months old   INTRAVASCULAR ULTRASOUND/IVUS N/A 08/05/2021   Procedure: Intravascular Ultrasound/IVUS;  Surgeon: Broadus John, MD;  Location: [place] [facility];  Service: Cardiovascular;  Laterality: N/A;   IVC VENOGRAPHY Left 08/05/2021   Procedure: IVC Venography;  Surgeon: Broadus John, MD;  Location: [place] [facility];  Service: Cardiovascular;  Laterality: Left;   LOWER EXTREMITY VENOGRAPHY Left 08/05/2021   Procedure: LOWER EXTREMITY VENOGRAPHY;  Surgeon: Broadus John, MD;  Location: [place] [facility];  Service: Cardiovascular;  Laterality: Left;   PERIPHERAL VASCULAR THROMBECTOMY Left 08/05/2021   Procedure: PERIPHERAL VASCULAR THROMBECTOMY;  Surgeon: Broadus John, MD;  Location: [place] [facility];  Service: Cardiovascular;  Laterality: Left;    Prior to Admission medications   Medication Sig Start Date End Date Taking? Authorizing Provider  acetaminophen (TYLENOL) 500 MG tablet Take 500 mg by mouth every 6 (six) hours as needed for mild pain or moderate pain.   Yes [provider]  albuterol (VENTOLIN HFA) 108 (90 Base) MCG/ACT inhaler Inhale 1-2 puffs into the lungs every 6 (six) hours as needed for wheezing or shortness of breath. 01/08/21  Yes Mound, Hildred Alamin E, FNP  ibuprofen (ADVIL) 200 MG tablet Take 600 mg by mouth every 6 (six) hours as needed for mild pain.   Yes [provider]  ibuprofen (ADVIL) 800 MG tablet Take 1 tablet (800 mg total) by mouth 3 (three) times daily. Patient not taking: Reported on 08/03/2021 03/03/20   Wieters, Hallie C, PA-C  ondansetron (ZOFRAN-ODT) 4 MG disintegrating tablet Take 1 tablet (4 mg total) by mouth every 8 (eight) hours as needed for nausea or vomiting. Patient not taking: Reported on 08/03/2021 05/27/21   Teodora Medici, FNP     Current Facility-Administered Medications  Medication Dose Route Frequency Provider Last Rate Last Admin   acetaminophen (TYLENOL) tablet 1,000 mg  1,000 mg Oral Q6H PRN Rayna Sexton, PA-C   1,000 mg at 08/05/21 9935   cyanocobalamin ((VITAMIN B-12)) injection 1,000 mcg  1,000 mcg Subcutaneous Daily Ghimire, Henreitta Leber, MD       heparin ADULT infusion 100 units/mL (25000 units/260m)  1,700 Units/hr Intravenous Continuous WLyndee Leo RPH 17 mL/hr at 08/06/21 1052 1,700 Units/hr at 08/06/21 1052   HYDROmorphone (DILAUDID) injection 0.5 mg  0.5 mg Intravenous Q4H PRN RBroadus John MD   0.5 mg at 08/06/21 1050   LORazepam (ATIVAN) injection 0-4 mg  0-4 mg Intravenous Q12H JRayna Sexton PA-C       Or   LORazepam (ATIVAN) tablet 0-4 mg  0-4 mg Oral Q12H Joldersma, Logan, PA-C       melatonin tablet 5 mg  5 mg Oral QHS PRN HIrene PapN, DO   5 mg at 08/05/21 2120   oxyCODONE (Oxy IR/ROXICODONE) immediate release tablet 5 mg  5 mg Oral Q6H PRN HIrene PapN, DO   5 mg at  08/06/21 0750   polyethylene glycol (MIRALAX / GLYCOLAX) packet 17 g  17 g Oral Daily PRN Irene Pap N, DO       thiamine tablet 100 mg  100 mg Oral Daily Rayna Sexton, PA-C   100 mg at 08/06/21 0845   Or   thiamine (B-1) injection 100 mg  100 mg Intravenous Daily Rayna Sexton, PA-C   100 mg at 08/04/21 1238    Allergies as of 08/03/2021 - Review Complete 08/03/2021  Allergen Reaction Noted   Tape Other (See Comments) 11/16/2016   Tea Nausea And Vomiting 03/04/2017    Family History  Problem Relation Age of Onset   Diabetes Maternal Grandmother    Kidney disease Maternal Grandmother    Breast cancer Paternal Grandmother    Heart disease Other    Colon cancer Cousin    Esophageal cancer Neg Hx    Rectal cancer Neg Hx    Stomach cancer Neg Hx     Social History   Socioeconomic History   Marital status: Single    Spouse name: Not on file   Number of children: 1   Years of education:  Not on file   Highest education level: Not on file  Occupational History   Not on file  Tobacco Use   Smoking status: Every Day    Packs/day: 1.50    Years: 15.00    Total pack years: 22.50    Types: Cigarettes   Smokeless tobacco: Never  Vaping Use   Vaping Use: Never used  Substance and Sexual Activity   Alcohol use: Yes   Drug use: Yes    Frequency: 1.0 times per week    Types: Marijuana   Sexual activity: Not Currently  Other Topics Concern   Not on file  Social History Narrative   Not on file   Social Determinants of Health   Financial Resource Strain: Not on file  Food Insecurity: Not on file  Transportation Needs: Not on file  Physical Activity: Not on file  Stress: Not on file  Social Connections: Not on file  Intimate Partner Violence: Not on file    Review of Systems: WLN:LGXQ a few lbs past week. Denies fever, sweats or chills.  CV: Denies chest pain, palpitations or edema. Resp: Denies cough, shortness of breath of hemoptysis.  GI: See HPI. GU : Denies urinary burning, blood in urine, increased urinary frequency or incontinence. MS: Left leg pain.  Derm: Denies rash, itchiness, skin lesions or unhealing ulcers. Psych: + Anxiety and depression.  Heme: Denies easy bruising, bleeding. Neuro:  Denies headaches, dizziness or paresthesias. Endo:  Denies any problems with DM, thyroid or adrenal function.  Physical Exam: Vital signs in last 24 hours: Temp:  [98 F (36.7 C)-100 F (37.8 C)] 98.4 F (36.9 C) (06/08 1133) Pulse Rate:  [96-117] 100 (06/08 0808) Resp:  [13-21] 19 (06/08 1133) BP: (118-160)/(73-96) 118/75 (06/08 1133) SpO2:  [94 %-100 %] 96 % (06/08 1133) Last BM Date : 08/04/21 General:  Alert pleasant 31 year old male in no acute distress. Head:  Normocephalic and atraumatic. Eyes:  No scleral icterus. Conjunctiva pink. Ears:  Normal auditory acuity. Nose:  No deformity, discharge or lesions. Mouth:  Dentition intact. No ulcers or  lesions.  Neck:  Supple. No lymphadenopathy or thyromegaly.  Lungs: Breath sounds clear but mildly diminished throughout. Heart: Regular rate and rhythm, no murmurs Abdomen: Lack, nondistended.  Nontender.  Positive bowel sounds to all 4 quadrants. Rectal: Patient declined rectal exam.  Musculoskeletal:  Symmetrical without gross deformities.  Pulses:  Normal pulses noted. Extremities:  LLE with tenderness. Neurologic:  Alert and  oriented x 4. No focal deficits.  Skin:  Intact without significant lesions or rashes. Psych:  Alert and cooperative. Normal mood and affect.  Intake/Output from previous day: 06/07 0701 - 06/08 0700 In: 2432.5 [P.O.:960; I.V.:1162.5; Blood:310] Out: 500 [Urine:500] Intake/Output this shift: Total I/O In: -  Out: 1200 [Urine:1200]  Lab Results: Recent Labs    08/04/21 0430 08/05/21 0510 08/06/21 0054 08/06/21 0825  WBC 12.3* 10.9* 13.3*  --   HGB 7.2* 7.3* 6.6* 8.0*  HCT 25.6* 25.5* 23.4* 27.0*  PLT 356 408* 427*  --    BMET Recent Labs    08/03/21 1659 08/04/21 0430  NA 132* 134*  K 3.8 3.7  CL 100 105  CO2 20* 22  GLUCOSE 105* 105*  BUN 8 7  CREATININE 0.69 0.67  CALCIUM 8.9 8.3*   LFT Recent Labs    08/04/21 0430  PROT 6.3*  ALBUMIN 2.8*  AST 11*  ALT 7  ALKPHOS 50  BILITOT 0.6   PT/INR No results for input(s): "LABPROT", "INR" in the last 72 hours. Hepatitis Panel No results for input(s): "HEPBSAG", "HCVAB", "HEPAIGM", "HEPBIGM" in the last 72 hours.    Studies/Results: PERIPHERAL VASCULAR CATHETERIZATION  Result Date: 08/05/2021 Images from the original result were not included. Patient name: Kealan Buchan MRN: [id number] DOB: 1990-08-18 Sex: male 08/05/2021 Pre-operative Diagnosis: Left lower extremity iliofemoral deep venous thrombosis Post-operative diagnosis:  Same Surgeon:  Broadus John, MD Procedure Performed: 1.  Ultrasound-guided micropuncture access of the left popliteal vein 2.  Left lower extremity  venogram 3.  IlioCaval venogram 4.  Percutaneous mechanical thrombectomy using Inari clot Treiver 5.  Intravascular ultrasound 6.  Access managed with Monocryl suture 7.  Moderate sedation time 45 minutes 8.  Contrast 50 mL Indications: This is a 31 y.o. male with extensive left lower extremity acute DVT extending from the common iliac vein through the tibial veins.  He does not have phlegmasia.  He is having significant pain.  No sensorimotor deficits.  I had a long conversation with Yer and his mother regarding percutaneous thrombectomy in an effort to relieve the thrombotic burden and improve pain symptoms. Findings: Thrombus extending from the left popliteal vein access site through the left common iliac vein.  No caval thrombus appreciated  Procedure: Patient was brought to the OR laid prone.  The patient was prepped and draped in standard fashion and a timeout was performed. I obtained access in the left popliteal vein using an ultrasound-guided micropuncture needle. From this access, a wire was run into the inferior vena cava.  An 8 French sheath was placed into the popliteal fossa and left leg venogram, cavogram followed.  This demonstrated thrombus throughout the left leg and left iliac system.  The cava demonstrated no thrombus.  An Amplatz wire was parked in the left subclavian vein, and the iliac confluence was marked using intravascular ultrasound.  Next, the Inari clot Isaias Sakai was brought onto the field.  The patient was heparinized. The 16 French sheath was placed in the popliteal fossa and the clot Donnella Bi was deployed in standard fashion with multiple passes from the inferior vena cava through the popliteal vein in the 12 o'clock position, 3 o'clock position, 6 o'clock position, 9 o'clock position.  There was significant thrombotic return.  The last pass had very little thrombotic burden appreciated in the basket. Follow-up venogram demonstrated  excellent result with excellent flow through the  left femoral and iliac system.  IVUS was brought onto the field to assess for iliac vein compression.  There was a moderate amount of compression appreciated, however I elected not to stent the lesion due to the patient's comorbidities including alcohol abuse, drug abuse, and concern for medication noncompliance. I had a long discussion with him regarding the above.  Should he re-thrombose, he would require stenting.  My working diagnosis is provoked DVT from immobility over the last week.  He will need hematology follow-up for hypercoagulable work-up as well. Cassandria Santee, MD [facility] [place] Office: [phone]   CARDIAC CATHETERIZATION  Result Date: 08/05/2021 Images from the original result were not included. Patient name: Seibert Keeter MRN: [id number] DOB: 05-Dec-1990 Sex: male 08/05/2021 Pre-operative Diagnosis: Left lower extremity iliofemoral deep venous thrombosis Post-operative diagnosis:  Same Surgeon:  Broadus John, MD Procedure Performed: 1.  Ultrasound-guided micropuncture access of the left popliteal vein 2.  Left lower extremity venogram 3.  IlioCaval venogram 4.  Percutaneous mechanical thrombectomy using Inari clot Treiver 5.  Intravascular ultrasound 6.  Access managed with Monocryl suture 7.  Moderate sedation time 45 minutes 8.  Contrast 50 mL Indications: This is a 31 y.o. male with extensive left lower extremity acute DVT extending from the common iliac vein through the tibial veins.  He does not have phlegmasia.  He is having significant pain.  No sensorimotor deficits.  I had a long conversation with Ayeden and his mother regarding percutaneous thrombectomy in an effort to relieve the thrombotic burden and improve pain symptoms. Findings: Thrombus extending from the left popliteal vein access site through the left common iliac vein.  No caval thrombus appreciated  Procedure: Patient was brought to the OR laid prone.  The patient was prepped and draped in  standard fashion and a timeout was performed. I obtained access in the left popliteal vein using an ultrasound-guided micropuncture needle. From this access, a wire was run into the inferior vena cava.  An 8 French sheath was placed into the popliteal fossa and left leg venogram, cavogram followed.  This demonstrated thrombus throughout the left leg and left iliac system.  The cava demonstrated no thrombus.  An Amplatz wire was parked in the left subclavian vein, and the iliac confluence was marked using intravascular ultrasound.  Next, the Inari clot Isaias Sakai was brought onto the field.  The patient was heparinized. The 16 French sheath was placed in the popliteal fossa and the clot Donnella Bi was deployed in standard fashion with multiple passes from the inferior vena cava through the popliteal vein in the 12 o'clock position, 3 o'clock position, 6 o'clock position, 9 o'clock position.  There was significant thrombotic return.  The last pass had very little thrombotic burden appreciated in the basket. Follow-up venogram demonstrated excellent result with excellent flow through the left femoral and iliac system.  IVUS was brought onto the field to assess for iliac vein compression.  There was a moderate amount of compression appreciated, however I elected not to stent the lesion due to the patient's comorbidities including alcohol abuse, drug abuse, and concern for medication noncompliance. I had a long discussion with him regarding the above.  Should he re-thrombose, he would require stenting.  My working diagnosis is provoked DVT from immobility over the last week.  He will need hematology follow-up for hypercoagulable work-up as well. Cassandria Santee, MD [facility] Office: [phone]  IMPRESSION/PLAN:  68) 31 year old male admitted to the hospital with an acute LLE DVT and PE with iron deficiency anemia. S/P percutaneous venous thrombectomy 6/7.  On Heparin infusion. Received IV  iron on 6/7.  On B12 injections.  Admission hemoglobin 8.6 (baseline Hg 11.6) -> 7.2 -> 7.3 -> today Hg 6.6 -> transfused one unit of PRBCs -> Hg 8.0.  Iron 9.  Saturation ratios 3.  TIBC 358.  Ferritin 11. B12 level 241.  No overt GI bleeding since admission. Chronic hemorrhoidal bleeding + bone marrow suppression from alcohol use likely contributing to anemia.  -CBC, BMP in am -Transfuse for hemoglobin less than 7 -Patient will require anticoagulation secondary to DVT/PE, etiology for anemia is unclear. Defer recommendations regarding diagnostic EGD and colonoscopy on Heparin to Dr. Rush Landmark -FOBT -Pantoprazole 61m one cap po QD -Monitor the patient for active GI bleeding  2) History of alcohol use disorder.  Last alcohol intake was on 6/3. Total bili 0.6.  Alk phos 50.  AST 11.  ALT 7.  Albumin 2.8.  No signs of active alcohol withdrawal. -Continue thiamine 1063mpo QD -Continue to monitor neuro status -Patient counseled no alcohol ever  3) History of on tubular adenomatous and 1 hyperplastic polyp per endoscopic evaluation November and December 2020.  4) Chronic rectal bleeding, likely due to hemorrhoids. S/P hemorrhoid banding in 2020. Patient reports the bleeding is much less now when compared to rectal bleeding in 2020.  5) Leukocytosis, likely due to DVT/PE  CoNoralyn Pick6/09/2021, 1:57PM

## 2021-08-06 NOTE — Progress Notes (Signed)
PROGRESS NOTE        PATIENT DETAILS Name: Mark Mcdaniel Age: 31 y.o. Sex: male Date of Birth: Jun 26, 1990 Admit Date: 08/03/2021 Admitting Physician Kayleen Memos, DO YIR:SWNIOEVO, [name], MD  Brief Summary: Mark Mcdaniel is a 31 y.o. caucasian male with medical history significant for polysubstance abuse including cocaine, THC, tobacco, alcohol use disorder who presented to [facility] ED from home with complaints of severe left lower extremity pain/swelling x2 days. Pt was diagnosed with Left lower extremity DVT on Doppler and CTA revealed bilateral PE without heart strain. Pt was admitted for further evaluation and work-up, started on heparin gtt and  vascular surgery following.  Significant event:  06/05 >>> DVT: pt adm with left leg pain and doppler shows left lower extremity DVT. 06/05 >>> Bilateral pulmonary embolism, CTA reveals bilateral PE without heart strain. 06/07 >>> Thrombectomy LLE   Significant Studies:  06/05 >> CTA chest: Bilateral lobar, segmental, and subsegmental pulmonary emboli with moderate-to-large thrombus.   06/05 >> DG Hip d/t left hip pain: no evidence of hip fracture or dislocation, or other focal bone abnormality. 06/05 >> VAS doppler LLE: acute deep vein thrombosis involving the left common femoral vein, SF junction, left femoral vein, left proximal  profunda vein, left popliteal vein, left posterior tibial veins, left  peroneal veins, and left gastrocnemius  Veins.  06/06 >> ECHO: EF 55 - 60%, Normal biventricular function without  evidence of hemodynamically significant valvular heart disease.    Significant toxicology: Positive THC on admission   Lab Data   Iron/TIBC/Ferritin/ %Sat    Component Value Date/Time   IRON 9 (L) 08/04/2021 0430   TIBC 358 08/04/2021 0430   FERRITIN 11 (L) 08/04/2021 0430   IRONPCTSAT 3 (L) 08/04/2021 0430    CBC Recent Labs    Latest Ref Rng & Units 08/06/2021    8:25 AM 08/06/2021   12:54  AM 08/05/2021    5:10 AM  CBC  WBC 4.0 - 10.5 K/uL  13.3  10.9   Hemoglobin 13.0 - 17.0 g/dL 8.0  6.6  7.3   Hematocrit 39.0 - 52.0 % 27.0  23.4  25.5   Platelets 150 - 400 K/uL  427  408       Subjective: Pt pleasant, sitting up in bed with mother present at bedside. Pt states "I have some soreness to the back of my leg". Pt denies any chest pain or shortness of breath or difficulty breathing. Pt endorse sore to LLE incision.   Objective: Vitals: Blood pressure 126/77, pulse 100, temperature 98 F (36.7 C), temperature source Oral, resp. rate 18, weight 56.9 kg, SpO2 94 %.   Exam: Gen Exam: Alert, pleasant, awake, not in any distress HEENT: neck supple, head atraumatic, normocephalic Chest: B/L clear to auscultation CVS: S1S2 regular, no JVD Abdomen: + BS, soft non tender, non distended Extremities: LLE +1 edema to angle & foot, ACE wrap intact, DP pulse Neurology: A&O x4, non focal Skin: no rash, LLE ACE wrap intact.  Pertinent Labs/Radiology:    Latest Ref Rng & Units 08/06/2021    8:25 AM 08/06/2021   12:54 AM 08/05/2021    5:10 AM  CBC  WBC 4.0 - 10.5 K/uL  13.3  10.9   Hemoglobin 13.0 - 17.0 g/dL 8.0  6.6  7.3   Hematocrit 39.0 - 52.0 % 27.0  23.4  25.5   Platelets 150 - 400 K/uL  427  408     Lab Results  Component Value Date   NA 134 (L) 08/04/2021   K 3.7 08/04/2021   CL 105 08/04/2021   CO2 22 08/04/2021      Assessment/Plan: Assessment and Plan: Acute extensive left lower extremity DVT confirmed with doppler to LLE. Pt has risk factors including polysubstance abuse, tobacco abuse and sedentary lifestyle. - Hematology consulted for evaluation -Continue heparin drip -Vascular surgery following  - thrombectomy 06/07   Acute Bilateral PE confirmed by CTA  Doppler to LLE confirms extensive DVT -Continue heparin drip   Hypovolemic hyponatremia, mild resolved Possible dehydration from decreased oral intake  - encourage increase oral intake and diet  -  repeat labs in the morning.    Alcohol use/abuse disorder Pt has hx of Tobacco abuse / Polysubstance abuse Including cocaine, UDS + for THC only Continues to smoke, used to drink approximately 76 ounces of beer per day but pt report he has been cutting back. No signs of withdrawal-claims drink was on 6/4. - continue CIWA protocol - continue multivitamin, folic acid and thiamine - provide counseling resources  - Cocaine/THC use: Counseled  Moderate protein calorie malnutrition Iron deficiency  Unclear etiology. - Hematology consulted - GI consulted - Received Ferric gluconate IV x1 - Vit -12 injections daily x7days - Received 1 unit of PRBC overnight. - Repeat labs in am  BMI: Estimated body mass index is 17.5 kg/m as calculated from the following:   Height as of 05/27/21: '5\' 11"'$  (1.803 m).   Weight as of this encounter: 56.9 kg.   Code status:   Code Status: Full Code   DVT Prophylaxis: Heparin drip   Family Communication: Mother at bedside  Disposition Plan: Status is: Inpatient Remains inpatient appropriate because: Not medically ready   Planned Discharge Destination:Home  Diet: Diet Order             Diet regular Room service appropriate? Yes with Assist; Fluid consistency: Thin  Diet effective now                    MEDICATIONS: Scheduled Meds:  cyanocobalamin  1,000 mcg Subcutaneous Daily   LORazepam  0-4 mg Intravenous Q12H   Or   LORazepam  0-4 mg Oral Q12H   thiamine  100 mg Oral Daily   Or   thiamine  100 mg Intravenous Daily   Continuous Infusions:  heparin 1,700 Units/hr (08/06/21 0600)   PRN Meds:.acetaminophen, HYDROmorphone (DILAUDID) injection, melatonin, oxyCODONE, polyethylene glycol   I have personally reviewed following labs and imaging studies  LABORATORY DATA: CBC: Recent Labs  Lab 08/03/21 1659 08/04/21 0430 08/05/21 0510 08/06/21 0054 08/06/21 0825  WBC 14.6* 12.3* 10.9* 13.3*  --   NEUTROABS 11.6* 8.7*  --   --    --   HGB 8.6* 7.2* 7.3* 6.6* 8.0*  HCT 31.8* 25.6* 25.5* 23.4* 27.0*  MCV 71.6* 69.6* 68.9* 69.4*  --   PLT 448* 356 408* 427*  --     Basic Metabolic Panel: Recent Labs  Lab 08/03/21 1659 08/04/21 0430  NA 132* 134*  K 3.8 3.7  CL 100 105  CO2 20* 22  GLUCOSE 105* 105*  BUN 8 7  CREATININE 0.69 0.67  CALCIUM 8.9 8.3*  MG  --  2.0  PHOS  --  4.1    GFR: Estimated Creatinine Clearance: 108.7 mL/min (by C-G formula based on SCr of  0.67 mg/dL).  Liver Function Tests: Recent Labs  Lab 08/03/21 1659 08/04/21 0430  AST 14* 11*  ALT 10 7  ALKPHOS 67 50  BILITOT 0.8 0.6  PROT 7.6 6.3*  ALBUMIN 3.4* 2.8*   No results for input(s): "LIPASE", "AMYLASE" in the last 168 hours. No results for input(s): "AMMONIA" in the last 168 hours.  Coagulation Profile: No results for input(s): "INR", "PROTIME" in the last 168 hours.  Cardiac Enzymes: No results for input(s): "CKTOTAL", "CKMB", "CKMBINDEX", "TROPONINI" in the last 168 hours.  BNP (last 3 results) No results for input(s): "PROBNP" in the last 8760 hours.  Lipid Profile: No results for input(s): "CHOL", "HDL", "LDLCALC", "TRIG", "CHOLHDL", "LDLDIRECT" in the last 72 hours.  Thyroid Function Tests: No results for input(s): "TSH", "T4TOTAL", "FREET4", "T3FREE", "THYROIDAB" in the last 72 hours.  Anemia Panel: Recent Labs    08/04/21 0430 08/06/21 0054  VITAMINB12  --  241  FOLATE  --  13.5  FERRITIN 11*  --   TIBC 358  --   IRON 9*  --     Urine analysis:    Component Value Date/Time   COLORURINE YELLOW 08/03/2021 2228   APPEARANCEUR CLEAR 08/03/2021 2228   LABSPEC 1.036 (H) 08/03/2021 2228   PHURINE 5.0 08/03/2021 2228   GLUCOSEU NEGATIVE 08/03/2021 2228   HGBUR NEGATIVE 08/03/2021 2228   BILIRUBINUR NEGATIVE 08/03/2021 2228   BILIRUBINUR small (A) 09/23/2015 1736   KETONESUR 80 (A) 08/03/2021 2228   PROTEINUR NEGATIVE 08/03/2021 2228   UROBILINOGEN 2.0 09/23/2015 1736   NITRITE NEGATIVE 08/03/2021  2228   LEUKOCYTESUR NEGATIVE 08/03/2021 2228    Sepsis Labs: Lactic Acid, Venous    Component Value Date/Time   LATICACIDVEN 1.0 08/03/2021 2228    MICROBIOLOGY: Recent Results (from the past 240 hour(s))  Culture, blood (routine x 2)     Status: None (Preliminary result)   Collection Time: 08/03/21  7:18 PM   Specimen: BLOOD LEFT ARM  Result Value Ref Range Status   Specimen Description BLOOD LEFT ARM  Final   Special Requests   Final    BOTTLES DRAWN AEROBIC AND ANAEROBIC Blood Culture adequate volume   Culture   Final    NO GROWTH 2 DAYS Performed at [facility], [place] [address], [place], [place] [address]    Report Status PENDING  Incomplete  Culture, blood (routine x 2)     Status: None (Preliminary result)   Collection Time: 08/03/21  8:05 PM   Specimen: BLOOD RIGHT ARM  Result Value Ref Range Status   Specimen Description BLOOD RIGHT ARM  Final   Special Requests   Final    BOTTLES DRAWN AEROBIC AND ANAEROBIC Blood Culture adequate volume   Culture   Final    NO GROWTH 2 DAYS Performed at [facility], [place] [address] [place], [place] [address]    Report Status PENDING  Incomplete  Surgical pcr screen     Status: None   Collection Time: 08/05/21 12:25 AM   Specimen: Nasal Mucosa; Nasal Swab  Result Value Ref Range Status   MRSA, PCR NEGATIVE NEGATIVE Final   Staphylococcus aureus NEGATIVE NEGATIVE Final    Comment: (NOTE) The Xpert SA Assay (FDA approved for NASAL specimens in patients 48 years of age and older), is one component of a comprehensive surveillance program. It is not intended to diagnose infection nor to guide or monitor treatment. Performed at [facility], [place] [address], [place], [place] [address]  RADIOLOGY STUDIES/RESULTS: PERIPHERAL VASCULAR CATHETERIZATION  Result Date: 08/05/2021 Images from the original result were not included. Patient name: Sebastion Jun MRN: [id number] DOB: 1990-08-18 Sex: male 08/05/2021  Pre-operative Diagnosis: Left lower extremity iliofemoral deep venous thrombosis Post-operative diagnosis:  Same Surgeon:  Broadus John, MD Procedure Performed: 1.  Ultrasound-guided micropuncture access of the left popliteal vein 2.  Left lower extremity venogram 3.  IlioCaval venogram 4.  Percutaneous mechanical thrombectomy using Inari clot Treiver 5.  Intravascular ultrasound 6.  Access managed with Monocryl suture 7.  Moderate sedation time 45 minutes 8.  Contrast 50 mL Indications: This is a 31 y.o. male with extensive left lower extremity acute DVT extending from the common iliac vein through the tibial veins.  He does not have phlegmasia.  He is having significant pain.  No sensorimotor deficits.  I had a long conversation with Dayvian and his mother regarding percutaneous thrombectomy in an effort to relieve the thrombotic burden and improve pain symptoms. Findings: Thrombus extending from the left popliteal vein access site through the left common iliac vein.  No caval thrombus appreciated  Procedure: Patient was brought to the OR laid prone.  The patient was prepped and draped in standard fashion and a timeout was performed. I obtained access in the left popliteal vein using an ultrasound-guided micropuncture needle. From this access, a wire was run into the inferior vena cava.  An 8 French sheath was placed into the popliteal fossa and left leg venogram, cavogram followed.  This demonstrated thrombus throughout the left leg and left iliac system.  The cava demonstrated no thrombus.  An Amplatz wire was parked in the left subclavian vein, and the iliac confluence was marked using intravascular ultrasound.  Next, the Inari clot Isaias Sakai was brought onto the field.  The patient was heparinized. The 16 French sheath was placed in the popliteal fossa and the clot Donnella Bi was deployed in standard fashion with multiple passes from the inferior vena cava through the popliteal vein in the 12 o'clock position, 3  o'clock position, 6 o'clock position, 9 o'clock position.  There was significant thrombotic return.  The last pass had very little thrombotic burden appreciated in the basket. Follow-up venogram demonstrated excellent result with excellent flow through the left femoral and iliac system.  IVUS was brought onto the field to assess for iliac vein compression.  There was a moderate amount of compression appreciated, however I elected not to stent the lesion due to the patient's comorbidities including alcohol abuse, drug abuse, and concern for medication noncompliance. I had a long discussion with him regarding the above.  Should he re-thrombose, he would require stenting.  My working diagnosis is provoked DVT from immobility over the last week.  He will need hematology follow-up for hypercoagulable work-up as well. Cassandria Santee, MD [facility] Office: [phone]   CARDIAC CATHETERIZATION  Result Date: 08/05/2021 Images from the original result were not included. Patient name: Abanoub Hanken MRN: [id number] DOB: 09/26/1990 Sex: male 08/05/2021 Pre-operative Diagnosis: Left lower extremity iliofemoral deep venous thrombosis Post-operative diagnosis:  Same Surgeon:  Broadus John, MD Procedure Performed: 1.  Ultrasound-guided micropuncture access of the left popliteal vein 2.  Left lower extremity venogram 3.  IlioCaval venogram 4.  Percutaneous mechanical thrombectomy using Inari clot Treiver 5.  Intravascular ultrasound 6.  Access managed with Monocryl suture 7.  Moderate sedation time 45 minutes 8.  Contrast 50 mL Indications: This is a 31 y.o. male with extensive  left lower extremity acute DVT extending from the common iliac vein through the tibial veins.  He does not have phlegmasia.  He is having significant pain.  No sensorimotor deficits.  I had a long conversation with Atari and his mother regarding percutaneous thrombectomy in an effort to relieve the thrombotic burden and  improve pain symptoms. Findings: Thrombus extending from the left popliteal vein access site through the left common iliac vein.  No caval thrombus appreciated  Procedure: Patient was brought to the OR laid prone.  The patient was prepped and draped in standard fashion and a timeout was performed. I obtained access in the left popliteal vein using an ultrasound-guided micropuncture needle. From this access, a wire was run into the inferior vena cava.  An 8 French sheath was placed into the popliteal fossa and left leg venogram, cavogram followed.  This demonstrated thrombus throughout the left leg and left iliac system.  The cava demonstrated no thrombus.  An Amplatz wire was parked in the left subclavian vein, and the iliac confluence was marked using intravascular ultrasound.  Next, the Inari clot Isaias Sakai was brought onto the field.  The patient was heparinized. The 16 French sheath was placed in the popliteal fossa and the clot Donnella Bi was deployed in standard fashion with multiple passes from the inferior vena cava through the popliteal vein in the 12 o'clock position, 3 o'clock position, 6 o'clock position, 9 o'clock position.  There was significant thrombotic return.  The last pass had very little thrombotic burden appreciated in the basket. Follow-up venogram demonstrated excellent result with excellent flow through the left femoral and iliac system.  IVUS was brought onto the field to assess for iliac vein compression.  There was a moderate amount of compression appreciated, however I elected not to stent the lesion due to the patient's comorbidities including alcohol abuse, drug abuse, and concern for medication noncompliance. I had a long discussion with him regarding the above.  Should he re-thrombose, he would require stenting.  My working diagnosis is provoked DVT from immobility over the last week.  He will need hematology follow-up for hypercoagulable work-up as well. Cassandria Santee, MD [facility] Office: [phone]   ECHOCARDIOGRAM COMPLETE  Result Date: 08/04/2021    ECHOCARDIOGRAM REPORT   Patient Name:   LE FAULCON Welford Date of Exam: 08/04/2021 Medical Rec #:  [id number]        Height:       71.0 in Accession #:    [id number]       Weight:       135.0 lb Date of Birth:  [birth year]         BSA:          1.784 m Patient Age:    31 years         BP:           109/86 mmHg Patient Gender: M                HR:           101 bpm. Exam Location:  Inpatient Procedure: 2D Echo, Cardiac Doppler, Color Doppler and Strain Analysis Indications:    Pulmonary embolus  History:        Patient has no prior history of Echocardiogram examinations.  Sonographer:    [place] Referring Phys: [id number] [place]  1. Left ventricular ejection fraction, by estimation, is 55 to 60%. The left ventricle has normal  function. The left ventricle has no regional wall motion abnormalities. Indeterminate diastolic filling due to E-A fusion.  2. Right ventricular systolic function is normal. The right ventricular size is normal. Tricuspid regurgitation signal is inadequate for assessing PA pressure.  3. The mitral valve is grossly normal. No evidence of mitral valve regurgitation. No evidence of mitral stenosis.  4. The aortic valve is tricuspid. Aortic valve regurgitation is not visualized. No aortic stenosis is present.  5. The inferior vena cava is normal in size with greater than 50% respiratory variability, suggesting right atrial pressure of 3 mmHg. Conclusion(s)/Recommendation(s): Normal biventricular function without evidence of hemodynamically significant valvular heart disease. FINDINGS  Left Ventricle: Left ventricular ejection fraction, by estimation, is 55 to 60%. The left ventricle has normal function. The left ventricle has no regional wall motion abnormalities. Global longitudinal strain performed but not reported based on interpreter judgement due to suboptimal  tracking. The left ventricular internal cavity size was normal in size. There is no left ventricular hypertrophy. Indeterminate diastolic filling due to E-A fusion. Right Ventricle: The right ventricular size is normal. No increase in right ventricular wall thickness. Right ventricular systolic function is normal. Tricuspid regurgitation signal is inadequate for assessing PA pressure. Left Atrium: Left atrial size was normal in size. Right Atrium: Right atrial size was normal in size. Pericardium: There is no evidence of pericardial effusion. Mitral Valve: The mitral valve is grossly normal. No evidence of mitral valve regurgitation. No evidence of mitral valve stenosis. MV peak gradient, 4.2 mmHg. The mean mitral valve gradient is 3.0 mmHg. Tricuspid Valve: The tricuspid valve is grossly normal. Tricuspid valve regurgitation is not demonstrated. No evidence of tricuspid stenosis. Aortic Valve: The aortic valve is tricuspid. Aortic valve regurgitation is not visualized. No aortic stenosis is present. Aortic valve mean gradient measures 3.0 mmHg. Aortic valve peak gradient measures 6.6 mmHg. Aortic valve area, by VTI measures 4.11 cm. Pulmonic Valve: The pulmonic valve was grossly normal. Pulmonic valve regurgitation is not visualized. No evidence of pulmonic stenosis. Aorta: The aortic root and ascending aorta are structurally normal, with no evidence of dilitation. Venous: The inferior vena cava is normal in size with greater than 50% respiratory variability, suggesting right atrial pressure of 3 mmHg. IAS/Shunts: The atrial septum is grossly normal.  LEFT VENTRICLE PLAX 2D LVIDd:         4.90 cm     Diastology LVIDs:         3.20 cm     LV e' medial:    11.10 cm/s LV PW:         1.00 cm     LV E/e' medial:  8.3 LV IVS:        0.90 cm     LV e' lateral:   14.00 cm/s LVOT diam:     2.50 cm     LV E/e' lateral: 6.6 LV SV:         79 LV SV Index:   44 LVOT Area:     4.91 cm  LV Volumes (MOD) LV vol d, MOD A2C: 91.1  ml LV vol d, MOD A4C: 83.1 ml LV vol s, MOD A2C: 48.4 ml LV vol s, MOD A4C: 32.4 ml LV SV MOD A2C:     42.7 ml LV SV MOD A4C:     83.1 ml LV SV MOD BP:      46.6 ml RIGHT VENTRICLE RV Basal diam:  4.00 cm RV Mid diam:    3.20 cm LEFT  ATRIUM           Index        RIGHT ATRIUM          Index LA Vol (A4C): 18.5 ml 10.37 ml/m  RA Area:     6.96 cm                                    RA Volume:   10.90 ml 6.11 ml/m  AORTIC VALVE                    PULMONIC VALVE AV Area (Vmax):    4.26 cm     PV Vmax:       1.18 m/s AV Area (Vmean):   4.16 cm     PV Vmean:      84.700 cm/s AV Area (VTI):     4.11 cm     PV VTI:        0.203 m AV Vmax:           128.00 cm/s  PV Peak grad:  5.6 mmHg AV Vmean:          84.500 cm/s  PV Mean grad:  3.0 mmHg AV VTI:            0.192 m AV Peak Grad:      6.6 mmHg AV Mean Grad:      3.0 mmHg LVOT Vmax:         111.00 cm/s LVOT Vmean:        71.600 cm/s LVOT VTI:          0.160 m LVOT/AV VTI ratio: 0.84  AORTA Ao Root diam: 3.10 cm Ao Asc diam:  3.20 cm MITRAL VALVE MV Area (PHT): 4.63 cm    SHUNTS MV Area VTI:   3.63 cm    Systemic VTI:  0.16 m MV Peak grad:  4.2 mmHg    Systemic Diam: 2.50 cm MV Mean grad:  3.0 mmHg MV Vmax:       1.02 m/s MV Vmean:      75.9 cm/s MV Decel Time: 164 msec MV E velocity: 91.80 cm/s MV A velocity: 85.70 cm/s MV E/A ratio:  1.07 Eleonore Chiquito MD Electronically signed by Eleonore Chiquito MD Signature Date/Time: 08/04/2021/12:13:16 PM    Final      LOS: 3 days   Tanda Rockers, AG-ACNP Student        [facility]   08/06/2021, 9:34 AM

## 2021-08-06 NOTE — Progress Notes (Signed)
Critical lab value: Hemoglobin 6.6. (Sent page to Sheran Luz

## 2021-08-07 ENCOUNTER — Other Ambulatory Visit (HOSPITAL_COMMUNITY): Payer: Self-pay

## 2021-08-07 ENCOUNTER — Telehealth: Payer: Self-pay | Admitting: Nurse Practitioner

## 2021-08-07 ENCOUNTER — Telehealth: Payer: Self-pay | Admitting: Hematology

## 2021-08-07 DIAGNOSIS — D509 Iron deficiency anemia, unspecified: Secondary | ICD-10-CM

## 2021-08-07 DIAGNOSIS — E46 Unspecified protein-calorie malnutrition: Secondary | ICD-10-CM

## 2021-08-07 LAB — CBC
HCT: 27.8 % — ABNORMAL LOW (ref 39.0–52.0)
Hemoglobin: 8.2 g/dL — ABNORMAL LOW (ref 13.0–17.0)
MCH: 20.5 pg — ABNORMAL LOW (ref 26.0–34.0)
MCHC: 29.5 g/dL — ABNORMAL LOW (ref 30.0–36.0)
MCV: 69.5 fL — ABNORMAL LOW (ref 80.0–100.0)
Platelets: 454 10*3/uL — ABNORMAL HIGH (ref 150–400)
RBC: 4 MIL/uL — ABNORMAL LOW (ref 4.22–5.81)
RDW: 17.8 % — ABNORMAL HIGH (ref 11.5–15.5)
WBC: 9.8 10*3/uL (ref 4.0–10.5)
nRBC: 0 % (ref 0.0–0.2)

## 2021-08-07 LAB — TYPE AND SCREEN
ABO/RH(D): AB POS
Antibody Screen: NEGATIVE
Unit division: 0

## 2021-08-07 LAB — BPAM RBC
Blood Product Expiration Date: 202306242359
ISSUE DATE / TIME: 202306080416
Unit Type and Rh: 8400

## 2021-08-07 LAB — HEPARIN LEVEL (UNFRACTIONATED): Heparin Unfractionated: 0.13 IU/mL — ABNORMAL LOW (ref 0.30–0.70)

## 2021-08-07 LAB — BASIC METABOLIC PANEL
Anion gap: 10 (ref 5–15)
BUN: <5 mg/dL — ABNORMAL LOW (ref 6–20)
CO2: 24 mmol/L (ref 22–32)
Calcium: 8.6 mg/dL — ABNORMAL LOW (ref 8.9–10.3)
Chloride: 101 mmol/L (ref 98–111)
Creatinine, Ser: 0.54 mg/dL — ABNORMAL LOW (ref 0.61–1.24)
GFR, Estimated: >60 mL/min (ref >60)
Glucose, Bld: 103 mg/dL — ABNORMAL HIGH (ref 70–99)
Potassium: 3.7 mmol/L (ref 3.5–5.1)
Sodium: 135 mmol/L (ref 135–145)

## 2021-08-07 MED ORDER — VITAMIN B-12 1000 MCG PO TABS
1000.0000 ug | ORAL_TABLET | Freq: Every day | ORAL | 3 refills | Status: AC
  Filled 2021-08-07: qty 30, 30d supply, fill #0

## 2021-08-07 MED ORDER — APIXABAN 5 MG PO TABS
10.0000 mg | ORAL_TABLET | Freq: Two times a day (BID) | ORAL | Status: DC
  Administered 2021-08-07: 10 mg via ORAL
  Filled 2021-08-07: qty 2

## 2021-08-07 MED ORDER — THIAMINE HCL 100 MG PO TABS
100.0000 mg | ORAL_TABLET | Freq: Every day | ORAL | 3 refills | Status: AC
  Filled 2021-08-07: qty 30, 30d supply, fill #0

## 2021-08-07 MED ORDER — PANTOPRAZOLE SODIUM 40 MG PO TBEC
40.0000 mg | DELAYED_RELEASE_TABLET | Freq: Every day | ORAL | 3 refills | Status: DC
  Filled 2021-08-07: qty 30, 30d supply, fill #0

## 2021-08-07 MED ORDER — APIXABAN 5 MG PO TABS: 5.0000 mg | ORAL_TABLET | Freq: Two times a day (BID) | ORAL | Status: DC

## 2021-08-07 MED ORDER — FERROUS SULFATE 325 (65 FE) MG PO TABS
325.0000 mg | ORAL_TABLET | Freq: Two times a day (BID) | ORAL | 3 refills | Status: AC
  Filled 2021-08-07: qty 60, 30d supply, fill #0

## 2021-08-07 MED ORDER — HYDROCORTISONE ACETATE 25 MG RE SUPP
25.0000 mg | Freq: Every evening | RECTAL | Status: DC | PRN
Start: 2021-08-07 — End: 2021-08-07

## 2021-08-07 MED ORDER — APIXABAN 5 MG PO TABS
ORAL_TABLET | ORAL | 3 refills | Status: AC
  Filled 2021-08-07: qty 60, 30d supply, fill #0

## 2021-08-07 MED ORDER — HYDROCORTISONE ACETATE 25 MG RE SUPP
25.0000 mg | Freq: Every evening | RECTAL | 0 refills | Status: DC | PRN
  Filled 2021-08-07: qty 12, 12d supply, fill #0

## 2021-08-07 NOTE — Progress Notes (Signed)
Wants to pursue EGD as outpatient Switch to Eliquis Will d/w CM regarding new PCP appt Plan for d/c later today See d/c summary for details

## 2021-08-07 NOTE — TOC Transition Note (Addendum)
Transition of Care (TOC) - CM/SW Discharge Note Marvetta Gibbons RN, BSN Transitions of Care Unit 4E- RN Case Manager See Treatment Team for direct phone #    Patient Details  Name: Mark Mcdaniel MRN: [id number] Date of Birth: 04-09-1990  Transition of Care [facility]) CM/SW Contact:  Dawayne Patricia, RN Phone Number: 08/07/2021, 11:54 AM   Clinical Narrative:    Pt stable for transition home today, [facility] pharmacy to provide Eliquis under 30 day free coupon and deliver to bedside. Mom to provide transportation home.    CM spoke with pt prior to leaving about Eliquis patient assistance program. Application provided to pt and instructions. Pt to f/u with vascular office for provider assistance with application and any further needs with Eliquis while application is in process. Pt voiced understanding.  Pt voiced that he will be borrowing a RW for use at home.   Pt also requesting assistance with new PCP- appointment made with [facility] for July 12 at 9:50. Pt called and provided the information for appointment as well as address and phone #- advised pt importance of not missing the appointment if calling should he need to change appointment. Pt voiced understanding. TOC office will also mail information on appointment to patient. FOR APPT:   TIME : 9:50 am  Date: July 12 ,2023  [place] , [place] , [place]  LOCATION : [id number]      Final next level of care: Home/Self Care Barriers to Discharge: No Barriers Identified   Patient Goals and CMS Choice Patient states their goals for this hospitalization and ongoing recovery are:: return home   Choice offered to / list presented to : NA  Discharge Placement               Home         Discharge Plan and Services   Discharge Planning Services: CM Consult, [facility], Follow-up appt scheduled, Medication Assistance Post Acute Care Choice: NA          DME Arranged: N/A DME Agency: NA       HH  Arranged: NA HH Agency: NA        Social Determinants of Health (SDOH) Interventions     Readmission Risk Interventions    08/07/2021   11:53 AM  Readmission Risk Prevention Plan  Post Dischage Appt Complete  Medication Screening Complete  Transportation Screening Complete

## 2021-08-07 NOTE — Progress Notes (Signed)
ANTICOAGULATION CONSULT NOTE - Follow Up Consult  Pharmacy Consult for heparin Indication:  PE/DVT  Labs: Recent Labs    08/04/21 0430 08/04/21 1328 08/05/21 0510 08/06/21 0054 08/06/21 0825 08/07/21 0322  HGB 7.2*  --  7.3* 6.6* 8.0*  --   HCT 25.6*  --  25.5* 23.4* 27.0*  --   PLT 356  --  408* 427*  --   --   HEPARINUNFRC <0.10*   < > 0.43 0.32  --  0.13*  CREATININE 0.67  --   --   --   --  0.54*   < > = values in this interval not displayed.    Assessment: 31yo male subtherapeutic on heparin after several levels at goal though had been trending down; no infusion issues or signs of bleeding per RN, awaiting [facility] collection, has had transfusion this admission, CBC still IP this am.  Goal of Therapy:  Heparin level 0.3-0.7 units/ml   Plan:  Will increase heparin infusion by ~10% to 1900 units/hr and check level in 6 hours.    Wynona Neat, PharmD, BCPS  08/07/2021,4:05 AM

## 2021-08-07 NOTE — Progress Notes (Signed)
Mobility Specialist: Progress Note   08/07/21 1036  Mobility  Activity Ambulated with assistance in hallway  Level of Assistance Contact guard assist, steadying assist  Assistive Device Front wheel walker  Distance Ambulated (ft) 120 ft (60'x2)  Activity Response Tolerated fair  $Mobility charge 1 Mobility   Pt received in the bed and agreeable to ambulation. Stopped x1 for seated break secondary to 7/10 LLE pain. Pt back to bed after session with call bell at his side.   [facility] Mobility Specialist Mobility Specialist [address]: [phone]

## 2021-08-07 NOTE — Progress Notes (Signed)
ANTICOAGULATION CONSULT NOTE   Pharmacy Consult for conversion from heparin infusion to apixaban Indication: DVT/PE  Allergies  Allergen Reactions   Tape Other (See Comments)    Medical tapes irritates the patient's skin   Tea Nausea And Vomiting    Vital Signs: Temp: 98 F (36.7 C) (06/09 0758) Temp Source: Oral (06/09 0758) BP: 135/81 (06/09 0758) Pulse Rate: 92 (06/09 0758)  Labs: Recent Labs    08/05/21 0510 08/06/21 0054 08/06/21 0825 08/07/21 0322  HGB 7.3* 6.6* 8.0* 8.2*  HCT 25.5* 23.4* 27.0* 27.8*  PLT 408* 427*  --  454*  HEPARINUNFRC 0.43 0.32  --  0.13*  CREATININE  --   --   --  0.54*     Estimated Creatinine Clearance: 109.2 mL/min (A) (by C-G formula based on SCr of 0.54 mg/dL (L)).   Medical History: Past Medical History:  Diagnosis Date   ADHD (attention deficit hyperactivity disorder)    Alcohol abuse    Alcohol withdrawal syndrome without complication (HCC)    Alcoholic ketoacidosis 03/05/5206   Anxiety    Asthma    Depression    suicidal in past   History of adenomatous polyp of colon 01/04/2019   Right clavicle fracture 11/2018    Assessment: 10 YOM presenting on 08/03/21 with leg pain, doppler with acute LLE DVT, he is not on anticoagulation PTA.   Also CT imaging for bilateral pulmonary emboli. No right heart strain.   S/p mechanical thrombectomy with Inari 6/7, heparin has been continued. Received a consult to discontinue heparin infusion and begin apixaban therapy  Goal of Therapy:  Monitor platelets by anticoagulation protocol: Yes   Plan:  Stop heparin infusion and begin apixaban Days 1-7: apixaban 10 mg po bid Starting day 8 (6/16) patient is to take 5 mg po bid  Birdena Kingma BS, PharmD, BCPS Clinical Pharmacist 08/07/2021 8:19 AM  Contact: [phone] after 3 PM  "Be curious, not judgmental..." -Jamal Maes

## 2021-08-07 NOTE — Telephone Encounter (Signed)
Mark Mcdaniel, patient was admitted to the hospital 6/5 with DVT/PE and IDA. He was discharged home today on Eliquis. Provide him with a lab order to check a CBC in 1 week. Please schedule him for a follow up appointment with Dr. Carlean Purl or with me in 3 weeks for IDA follow up and to discuss scheduling a diagnostic EGD and colonoscopy. We will need to verify with Dr. Carlean Purl if he is willing to do EGD/colonoscopy on Eliquis. THX.

## 2021-08-07 NOTE — Discharge Summary (Signed)
PATIENT DETAILS Name: Mark Mcdaniel Age: 31 y.o. Sex: male Date of Birth: [birth year] MRN: [id number]. Admitting Physician: Kayleen Memos, DO ESP:[id number], [name], MD  Admit Date: 08/03/2021 Discharge date: 08/07/2021  Recommendations for Outpatient Follow-up:  Follow up with PCP in 1-2 weeks Please obtain CMP/CBC in one week Please ensure follow-up with gastroenterology-for consideration of outpatient EGD. Please ensure follow-up with hematology Please ensure follow-up with vascular surgery Check iron/vitamin B-12 levels in 6 to 8 weeks.  Admitted From:  Home  Disposition: Home   Discharge Condition: good  CODE STATUS:   Code Status: Full Code   Diet recommendation:  Diet Order             Diet Carb Modified           Diet regular Room service appropriate? Yes with Assist; Fluid consistency: Thin  Diet effective now                    Brief Summary: 5 year oldwith history of polysubstance use-presented with left leg pain-found to have extensive DVT and PE on further evaluation.  Significant events:  06/05 >>> admit to [facility] with extensive left leg swelling-found to have DVT and PE.  06/07 >>> Thrombectomy LLE  Significant Studies:  06/05 >> CTA chest: Bilateral lobar, segmental, and subsegmental pulmonary emboli with moderate-to-large thrombus. 06/05 >> VAS doppler LLE: acute deep vein thrombosis involving the left common femoral vein, SF junction, left femoral vein, left proximal  profunda vein, left popliteal vein, left posterior tibial veins, left  peroneal veins, and left gastrocnemius Veins. 06/06 >> ECHO: EF 55 - 60%, Normal biventricular function without  evidence of hemodynamically significant valvular heart disease.   Brief Hospital Course: Extensive LLE DVT with PE: S/p thrombectomy on 6/7-PE is small and he is essentially asymptomatic.  Managed with IV heparin-discussed with vascular surgery today-okay for discharge-will transition to Eliquis.     Iron deficiency anemia: Although patient has a history of hematochezia due to hemorrhoids-hematochezia has markedly improved post banding procedure in 2020.  He occasionally notices blood when he wipes following a BM-but denies any overt hematochezia.  Given low vitamin B12 levels-suspicion that he may have a absorption issue.  GI was consulted-for possible EGD before he could be started on long-term oral anticoagulation-however patient prefers to pursue this in the outpatient setting.  I will continue iron supplementation on discharge-patient is aware that he needs to follow with his PCP and repeat CBC in a week or so, and repeat iron levels in 6 to 8 weeks.   Vitamin B12 deficiency: Started on parenteral B12 supplementation-suspect he may have an absorption issue-he is not keen on any SQ or IM supplementation as he is afraid of needles.  I have asked him to follow-up with his PCP and see if he can at least get monthly B12 injections.  He is aware that oral B12 supplementation may not work-but he wants to try that.  Please repeat B12 levels in 6 to 8 weeks.   Colonic polyp seen on colonoscopy 2020: Per GI note-needs a repeat colonoscopy 7 years.   EtOH use: No signs of withdrawal-claims drink was on 6/4.  Claims he has been cutting back-and has not had withdrawals when he does not drink for a few days.   Cocaine/THC use: Counseled.   Moderate protein calorie malnutrition  Obesity: Estimated body mass index is 17.6 kg/m as calculated from the following:   Height as of 05/27/21: '5\' 11"'$  (1.803 m).  Weight as of this encounter: 57.2 kg.    Discharge Diagnoses:  Principal Problem:   Bilateral pulmonary embolism (HCC) Active Problems:   Alcohol abuse with alcohol-induced mood disorder (HCC)   Substance induced mood disorder (HCC)   Acute DVT (deep venous thrombosis) (HCC)   Iron deficiency anemia   Unspecified protein-calorie malnutrition (HCC)   Anemia   Discharge  Instructions:  Activity:  As tolerated  Discharge Instructions     Ambulatory referral to Hematology / Oncology   Complete by: As directed    Unprovoked VTE-needs evaluation for hypercoagulable work-up.   Call MD for:   Complete by: As directed    Severe hematochezia/rectal bleeding.   Call MD for:  persistant nausea and vomiting   Complete by: As directed    Diet Carb Modified   Complete by: As directed    Discharge instructions   Complete by: As directed    Follow with Primary MD  Enid Skeens., MD in 1-2 weeks  Please get a complete blood count and chemistry panel checked by your Primary MD at your next visit, and again as instructed by your Primary MD.  Please follow-up with hematology (office should give you a call) if you do not hear from them-please give them a call.  Please follow-up with vascular surgery  Please ask your primary care practitioner to check iron and vitamin B12 levels in 6 to 8 weeks.  Stop further cocaine/alcohol/marijuana use.  Get Medicines reviewed and adjusted: Please take all your medications with you for your next visit with your Primary MD  Laboratory/radiological data: Please request your Primary MD to go over all hospital tests and procedure/radiological results at the follow up, please ask your Primary MD to get all Hospital records sent to his/her office.  In some cases, they will be blood work, cultures and biopsy results pending at the time of your discharge. Please request that your primary care M.D. follows up on these results.  Also Note the following: If you experience worsening of your admission symptoms, develop shortness of breath, life threatening emergency, suicidal or homicidal thoughts you must seek medical attention immediately by calling 911 or calling your MD immediately  if symptoms less severe.  You must read complete instructions/literature along with all the possible adverse reactions/side effects for all the  Medicines you take and that have been prescribed to you. Take any new Medicines after you have completely understood and accpet all the possible adverse reactions/side effects.   Do not drive when taking Pain medications or sleeping medications (Benzodaizepines)  Do not take more than prescribed Pain, Sleep and Anxiety Medications. It is not advisable to combine anxiety,sleep and pain medications without talking with your primary care practitioner  Special Instructions: If you have smoked or chewed Tobacco  in the last 2 yrs please stop smoking, stop any regular Alcohol  and or any Recreational drug use.  Wear Seat belts while driving.  Please note: You were cared for by a hospitalist during your hospital stay. Once you are discharged, your primary care physician will handle any further medical issues. Please note that NO REFILLS for any discharge medications will be authorized once you are discharged, as it is imperative that you return to your primary care physician (or establish a relationship with a primary care physician if you do not have one) for your post hospital discharge needs so that they can reassess your need for medications and monitor your lab values.   Increase activity slowly  Complete by: As directed       Allergies as of 08/07/2021       Reactions   Tape Other (See Comments)   Medical tapes irritates the patient's skin   Tea Nausea And Vomiting        Medication List     STOP taking these medications    ibuprofen 200 MG tablet Commonly known as: ADVIL   ibuprofen 800 MG tablet Commonly known as: ADVIL   ondansetron 4 MG disintegrating tablet Commonly known as: ZOFRAN-ODT       TAKE these medications    acetaminophen 500 MG tablet Commonly known as: TYLENOL Take 500 mg by mouth every 6 (six) hours as needed for mild pain or moderate pain.   albuterol 108 (90 Base) MCG/ACT inhaler Commonly known as: VENTOLIN HFA Inhale 1-2 puffs into the lungs  every 6 (six) hours as needed for wheezing or shortness of breath.   apixaban 5 MG Tabs tablet Commonly known as: ELIQUIS 2 tablets (10 mg) p.o. twice daily for 7 days, then switch to 1 tablet (5 mg) p.o. twice daily   ferrous sulfate 325 (65 FE) MG EC tablet Take 1 tablet (325 mg total) by mouth 2 (two) times daily.   hydrocortisone 25 MG suppository Commonly known as: ANUSOL-HC Place 1 suppository (25 mg total) rectally at bedtime as needed for hemorrhoids or anal itching.   pantoprazole 40 MG tablet Commonly known as: PROTONIX Take 1 tablet (40 mg total) by mouth daily.   thiamine 100 MG tablet Take 1 tablet (100 mg total) by mouth daily.   vitamin B-12 1000 MCG tablet Commonly known as: CYANOCOBALAMIN Take 1 tablet (1,000 mcg total) by mouth daily.        Follow-up Information     [facility] Follow up in 1 month(s).   Why: The office will call the patient with an appointment Contact information: [place] [phone] [facility] Follow up.   Specialty: Oncology Why: Office will call with date/time, If you dont hear from them,please give them a call Contact information: [place] [phone] [place] [phone]               Allergies  Allergen Reactions   Tape Other (See Comments)    Medical tapes irritates the patient's skin   Tea Nausea And Vomiting     Other Procedures/Studies: PERIPHERAL VASCULAR CATHETERIZATION  Result Date: 08/05/2021 Images from the original result were not included. Patient name: Mark Mcdaniel MRN: [id number] DOB: 08/03/1990 Sex: male 08/05/2021 Pre-operative Diagnosis: Left lower extremity iliofemoral deep venous thrombosis Post-operative diagnosis:  Same Surgeon:  Mark John, MD Procedure Performed: 1.  Ultrasound-guided micropuncture access of the left popliteal vein 2.  Left  lower extremity venogram 3.  IlioCaval venogram 4.  Percutaneous mechanical thrombectomy using Inari clot Treiver 5.  Intravascular ultrasound 6.  Access managed with Monocryl suture 7.  Moderate sedation time 45 minutes 8.  Contrast 50 mL Indications: This is a 31 y.o. male with extensive left lower extremity acute DVT extending from the common iliac vein through the tibial veins.  He does not have phlegmasia.  He is having significant pain.  No sensorimotor deficits.  I had a long conversation with Mark Mcdaniel and his mother regarding percutaneous thrombectomy in an effort to relieve the thrombotic burden and improve pain symptoms. Findings: Thrombus extending from the  left popliteal vein access site through the left common iliac vein.  No caval thrombus appreciated  Procedure: Patient was brought to the OR laid prone.  The patient was prepped and draped in standard fashion and a timeout was performed. I obtained access in the left popliteal vein using an ultrasound-guided micropuncture needle. From this access, a wire was run into the inferior vena cava.  An 8 French sheath was placed into the popliteal fossa and left leg venogram, cavogram followed.  This demonstrated thrombus throughout the left leg and left iliac system.  The cava demonstrated no thrombus.  An Amplatz wire was parked in the left subclavian vein, and the iliac confluence was marked using intravascular ultrasound.  Next, the Inari clot Mark Mcdaniel was brought onto the field.  The patient was heparinized. The 16 French sheath was placed in the popliteal fossa and the clot Mark Mcdaniel was deployed in standard fashion with multiple passes from the inferior vena cava through the popliteal vein in the 12 o'clock position, 3 o'clock position, 6 o'clock position, 9 o'clock position.  There was significant thrombotic return.  The last pass had very little thrombotic burden appreciated in the basket. Follow-up venogram demonstrated excellent result with excellent  flow through the left femoral and iliac system.  IVUS was brought onto the field to assess for iliac vein compression.  There was a moderate amount of compression appreciated, however I elected not to stent the lesion due to the patient's comorbidities including alcohol abuse, drug abuse, and concern for medication noncompliance. I had a long discussion with him regarding the above.  Should he re-thrombose, he would require stenting.  My working diagnosis is provoked DVT from immobility over the last week.  He will need hematology follow-up for hypercoagulable work-up as well. Mark Santee, MD [facility] [place] Office: [phone]   CARDIAC CATHETERIZATION  Result Date: 08/05/2021 Images from the original result were not included. Patient name: Mark Mcdaniel MRN: [id number] DOB: [birth year] Sex: male 08/05/2021 Pre-operative Diagnosis: Left lower extremity iliofemoral deep venous thrombosis Post-operative diagnosis:  Same Surgeon:  Mark John, MD Procedure Performed: 1.  Ultrasound-guided micropuncture access of the left popliteal vein 2.  Left lower extremity venogram 3.  IlioCaval venogram 4.  Percutaneous mechanical thrombectomy using Inari clot Treiver 5.  Intravascular ultrasound 6.  Access managed with Monocryl suture 7.  Moderate sedation time 45 minutes 8.  Contrast 50 mL Indications: This is a 30 y.o. male with extensive left lower extremity acute DVT extending from the common iliac vein through the tibial veins.  He does not have phlegmasia.  He is having significant pain.  No sensorimotor deficits.  I had a long conversation with Mark Mcdaniel and his mother regarding percutaneous thrombectomy in an effort to relieve the thrombotic burden and improve pain symptoms. Findings: Thrombus extending from the left popliteal vein access site through the left common iliac vein.  No caval thrombus appreciated  Procedure: Patient was brought to the OR laid prone.  The patient was prepped  and draped in standard fashion and a timeout was performed. I obtained access in the left popliteal vein using an ultrasound-guided micropuncture needle. From this access, a wire was run into the inferior vena cava.  An 8 French sheath was placed into the popliteal fossa and left leg venogram, cavogram followed.  This demonstrated thrombus throughout the left leg and left iliac system.  The cava demonstrated no thrombus.  An Amplatz wire was parked in the left subclavian  vein, and the iliac confluence was marked using intravascular ultrasound.  Next, the Inari clot Mark Mcdaniel was brought onto the field.  The patient was heparinized. The 16 French sheath was placed in the popliteal fossa and the clot Mark Mcdaniel was deployed in standard fashion with multiple passes from the inferior vena cava through the popliteal vein in the 12 o'clock position, 3 o'clock position, 6 o'clock position, 9 o'clock position.  There was significant thrombotic return.  The last pass had very little thrombotic burden appreciated in the basket. Follow-up venogram demonstrated excellent result with excellent flow through the left femoral and iliac system.  IVUS was brought onto the field to assess for iliac vein compression.  There was a moderate amount of compression appreciated, however I elected not to stent the lesion due to the patient's comorbidities including alcohol abuse, drug abuse, and concern for medication noncompliance. I had a long discussion with him regarding the above.  Should he re-thrombose, he would require stenting.  My working diagnosis is provoked DVT from immobility over the last week.  He will need hematology follow-up for hypercoagulable work-up as well. Mark Santee, MD [facility] [place] Office: [phone]   ECHOCARDIOGRAM COMPLETE  Result Date: 08/04/2021    ECHOCARDIOGRAM REPORT   Patient Name:   ADORIAN GWYNNE Mezquita Date of Exam: 08/04/2021 Medical Rec #:  [id number]        Height:       71.0  in Accession #:    [id number]       Weight:       135.0 lb Date of Birth:  1990/12/06         BSA:          1.784 m Patient Age:    31 years         BP:           109/86 mmHg Patient Gender: M                HR:           101 bpm. Exam Location:  Inpatient Procedure: 2D Echo, Cardiac Doppler, Color Doppler and Strain Analysis Indications:    Pulmonary embolus  History:        Patient has no prior history of Echocardiogram examinations.  Sonographer:    [place] Referring Phys: [id number] [place]  1. Left ventricular ejection fraction, by estimation, is 55 to 60%. The left ventricle has normal function. The left ventricle has no regional wall motion abnormalities. Indeterminate diastolic filling due to E-A fusion.  2. Right ventricular systolic function is normal. The right ventricular size is normal. Tricuspid regurgitation signal is inadequate for assessing PA pressure.  3. The mitral valve is grossly normal. No evidence of mitral valve regurgitation. No evidence of mitral stenosis.  4. The aortic valve is tricuspid. Aortic valve regurgitation is not visualized. No aortic stenosis is present.  5. The inferior vena cava is normal in size with greater than 50% respiratory variability, suggesting right atrial pressure of 3 mmHg. Conclusion(s)/Recommendation(s): Normal biventricular function without evidence of hemodynamically significant valvular heart disease. FINDINGS  Left Ventricle: Left ventricular ejection fraction, by estimation, is 55 to 60%. The left ventricle has normal function. The left ventricle has no regional wall motion abnormalities. Global longitudinal strain performed but not reported based on interpreter judgement due to suboptimal tracking. The left ventricular internal cavity size was normal in size. There is no left ventricular hypertrophy. Indeterminate diastolic  filling due to E-A fusion. Right Ventricle: The right ventricular size is normal. No increase in right  ventricular wall thickness. Right ventricular systolic function is normal. Tricuspid regurgitation signal is inadequate for assessing PA pressure. Left Atrium: Left atrial size was normal in size. Right Atrium: Right atrial size was normal in size. Pericardium: There is no evidence of pericardial effusion. Mitral Valve: The mitral valve is grossly normal. No evidence of mitral valve regurgitation. No evidence of mitral valve stenosis. MV peak gradient, 4.2 mmHg. The mean mitral valve gradient is 3.0 mmHg. Tricuspid Valve: The tricuspid valve is grossly normal. Tricuspid valve regurgitation is not demonstrated. No evidence of tricuspid stenosis. Aortic Valve: The aortic valve is tricuspid. Aortic valve regurgitation is not visualized. No aortic stenosis is present. Aortic valve mean gradient measures 3.0 mmHg. Aortic valve peak gradient measures 6.6 mmHg. Aortic valve area, by VTI measures 4.11 cm. Pulmonic Valve: The pulmonic valve was grossly normal. Pulmonic valve regurgitation is not visualized. No evidence of pulmonic stenosis. Aorta: The aortic root and ascending aorta are structurally normal, with no evidence of dilitation. Venous: The inferior vena cava is normal in size with greater than 50% respiratory variability, suggesting right atrial pressure of 3 mmHg. IAS/Shunts: The atrial septum is grossly normal.  LEFT VENTRICLE PLAX 2D LVIDd:         4.90 cm     Diastology LVIDs:         3.20 cm     LV e' medial:    11.10 cm/s LV PW:         1.00 cm     LV E/e' medial:  8.3 LV IVS:        0.90 cm     LV e' lateral:   14.00 cm/s LVOT diam:     2.50 cm     LV E/e' lateral: 6.6 LV SV:         79 LV SV Index:   44 LVOT Area:     4.91 cm  LV Volumes (MOD) LV vol d, MOD A2C: 91.1 ml LV vol d, MOD A4C: 83.1 ml LV vol s, MOD A2C: 48.4 ml LV vol s, MOD A4C: 32.4 ml LV SV MOD A2C:     42.7 ml LV SV MOD A4C:     83.1 ml LV SV MOD BP:      46.6 ml RIGHT VENTRICLE RV Basal diam:  4.00 cm RV Mid diam:    3.20 cm LEFT ATRIUM            Index        RIGHT ATRIUM          Index LA Vol (A4C): 18.5 ml 10.37 ml/m  RA Area:     6.96 cm                                    RA Volume:   10.90 ml 6.11 ml/m  AORTIC VALVE                    PULMONIC VALVE AV Area (Vmax):    4.26 cm     PV Vmax:       1.18 m/s AV Area (Vmean):   4.16 cm     PV Vmean:      84.700 cm/s AV Area (VTI):     4.11 cm     PV VTI:  0.203 m AV Vmax:           128.00 cm/s  PV Peak grad:  5.6 mmHg AV Vmean:          84.500 cm/s  PV Mean grad:  3.0 mmHg AV VTI:            0.192 m AV Peak Grad:      6.6 mmHg AV Mean Grad:      3.0 mmHg LVOT Vmax:         111.00 cm/s LVOT Vmean:        71.600 cm/s LVOT VTI:          0.160 m LVOT/AV VTI ratio: 0.84  AORTA Ao Root diam: 3.10 cm Ao Asc diam:  3.20 cm MITRAL VALVE MV Area (PHT): 4.63 cm    SHUNTS MV Area VTI:   3.63 cm    Systemic VTI:  0.16 m MV Peak grad:  4.2 mmHg    Systemic Diam: 2.50 cm MV Mean grad:  3.0 mmHg MV Vmax:       1.02 m/s MV Vmean:      75.9 cm/s MV Decel Time: 164 msec MV E velocity: 91.80 cm/s MV A velocity: 85.70 cm/s MV E/A ratio:  1.07 Eleonore Chiquito MD Electronically signed by Eleonore Chiquito MD Signature Date/Time: 08/04/2021/12:13:16 PM    Final    CT VENOGRAM ABD/PEL  Result Date: 08/03/2021 CLINICAL DATA:  Follow-up extensive DVT seen on ultrasound imaging. EXAM: CT VENOGRAM ABDOMEN AND PELVIS TECHNIQUE: Multidetector CT imaging of the abdomen and pelvis was performed using the standard protocol following bolus administration of intravenous contrast. RADIATION DOSE REDUCTION: This exam was performed according to the departmental dose-optimization program which includes automated exposure control, adjustment of the mA and/or kV according to patient size and/or use of iterative reconstruction technique. CONTRAST:  36m OMNIPAQUE IOHEXOL 350 MG/ML SOLN COMPARISON:  November 16, 2016 FINDINGS: Lower chest: A moderate amount of intraluminal low attenuation is seen within the lower lobe branches of the  right pulmonary artery. Hepatobiliary: No focal liver abnormality is seen. No gallstones, gallbladder wall thickening, or biliary dilatation. Pancreas: Unremarkable. No pancreatic ductal dilatation or surrounding inflammatory changes. Spleen: Normal in size without focal abnormality. Adrenals/Urinary Tract: Adrenal glands are unremarkable. Kidneys are normal, without renal calculi, focal lesion, or hydronephrosis. Bladder is unremarkable. Stomach/Bowel: Stomach is within normal limits. Appendix appears normal. No evidence of bowel wall thickening, distention, or inflammatory changes. Vascular/Lymphatic: The arterial structures within the abdomen pelvis are unremarkable. An extensive amount of intraluminal low attenuation is seen within the left common iliac vein. This originates from its origin and extends throughout the left external iliac vein and left internal iliac vein, with progression to involve the visualized portion of the left common femoral vein and deep left femoral vein. Associated perivascular inflammatory fat stranding is seen. No enlarged abdominal or pelvic lymph nodes. Reproductive: Prostate is unremarkable. Other: No abdominal wall hernia or abnormality. No abdominopelvic ascites. Musculoskeletal: No acute or significant osseous findings. IMPRESSION: 1. Moderate amount of pulmonary embolism within the lower lobe branches of the RIGHT pulmonary artery. 2. Extensive amount of occlusive thrombus within the LEFT common iliac vein, LEFT external iliac vein and LEFT internal iliac vein, with progression to involve the visualized portion of the LEFT common femoral vein and deep left femoral vein. Electronically Signed   By: TVirgina NorfolkM.D.   On: 08/03/2021 22:20   CT Angio Chest PE W and/or Wo Contrast  Addendum Date: 08/03/2021  ADDENDUM REPORT: 08/03/2021 21:26 ADDENDUM: Critical findings were reported to Dr. Rayna Sexton at 9:25 p.m. Electronically Signed   By: Brett Fairy M.D.   On:  08/03/2021 21:26   Result Date: 08/03/2021 CLINICAL DATA:  Pulmonary embolism suspected, high probability. Tachycardia. EXAM: CT ANGIOGRAPHY CHEST WITH CONTRAST TECHNIQUE: Multidetector CT imaging of the chest was performed using the standard protocol during bolus administration of intravenous contrast. Multiplanar CT image reconstructions and MIPs were obtained to evaluate the vascular anatomy. RADIATION DOSE REDUCTION: This exam was performed according to the departmental dose-optimization program which includes automated exposure control, adjustment of the mA and/or kV according to patient size and/or use of iterative reconstruction technique. CONTRAST:  2m OMNIPAQUE IOHEXOL 350 MG/ML SOLN COMPARISON:  None Available. FINDINGS: Cardiovascular: Heart is normal in size and there is no pericardial effusion. The aorta and pulmonary trunk are normal in caliber. There are lobar, segmental, and subsegmental pulmonary artery filling defects bilaterally with moderate to large emboli burden. No evidence of right heart strain. Mediastinum/Nodes: No enlarged mediastinal, hilar, or axillary lymph nodes. Thyroid gland, trachea, and esophagus demonstrate no significant findings. Lungs/Pleura: Minimal apical pleural scarring is noted bilaterally. No consolidation, effusion, or pneumothorax. Upper Abdomen: No acute abnormality. Musculoskeletal: No acute osseous abnormality. Review of the MIP images confirms the above findings. IMPRESSION: 1. Bilateral lobar, segmental, and subsegmental pulmonary emboli with moderate-to-large thrombus burden. No evidence of right heart strain. 2. No acute infiltrate in the lungs. Electronically Signed: By: LBrett FairyM.D. On: 08/03/2021 21:21   VAS [place]/ILIAC (VENOUS ONLY)  Result Date: 08/03/2021 IVC/ILIAC STUDY Patient Name:  JNORBERT MALKIN Date of Exam:   08/03/2021 Medical Rec #: [id number]        Accession #:    [id number] of Birth: 408/21/1992         Patient Gender: Patient  Age:   89years [facility]      [facility] [place]ILI[place] (VENOUS ONLY) Referring Phys: --------------------------------------------------------------------------------  Indications: Extensive left lower extremity DVT with extension above inguinal              ligament Limitations: Air/bowel gas.  Comparison Study: 08-03-2021 Left lower extremity venous study. Performing Technologist: RDarlin CocoRDMS, RVT  Examination Guidelines: A complete evaluation includes B-mode imaging, spectral Doppler, color Doppler, and power Doppler as needed of all accessible portions of each vessel. Bilateral testing is considered an integral part of a complete examination. Limited examinations for reoccurring indications may be performed as noted.  IVC/Iliac Findings: +----------+------+--------+--------+    IVC    PatentThrombusComments +----------+------+--------+--------+ IVC Mid   patent                 +----------+------+--------+--------+ IVC Distalpatent                 +----------+------+--------+--------+  +-------------------+---------+-----------+---------+-----------+--------+         CIV        RT-PatentRT-ThrombusLT-PatentLT-ThrombusComments +-------------------+---------+-----------+---------+-----------+--------+ Common Iliac Prox   patent                         acute            +-------------------+---------+-----------+---------+-----------+--------+ Common Iliac Mid    patent                         acute            +-------------------+---------+-----------+---------+-----------+--------+ Common Iliac Distal patent  acute            +-------------------+---------+-----------+---------+-----------+--------+  +-------------------------+---------+-----------+---------+-----------+--------+            EIV           RT-PatentRT-ThrombusLT-PatentLT-ThrombusComments  +-------------------------+---------+-----------+---------+-----------+--------+ External Iliac Vein Prox  patent                         acute            +-------------------------+---------+-----------+---------+-----------+--------+ External Iliac Vein Mid   patent                         acute            +-------------------------+---------+-----------+---------+-----------+--------+ External Iliac Vein       patent                         acute            Distal                                                                    +-------------------------+---------+-----------+---------+-----------+--------+   Summary: IVC/Iliac: There is no evidence of thrombus involving the IVC. There is no evidence of thrombus involving the right common iliac vein. There is evidence of acute thrombus involving the left common iliac vein. There is no evidence of thrombus involving the right external iliac vein. There is evidence of acute thrombus involving the left external iliac vein. Visualization of proximal Inferior Vena Cava was limited.  *See table(s) above for measurements and observations.  Electronically signed by Mark Mcdaniel on 08/03/2021 at 7:09:53 PM.    Final    VAS [place] LOWER EXTREMITY VENOUS (DVT) (ONLY [facility] & [facility])  Result Date: 08/03/2021  Lower Venous DVT Study Patient Name:  TREMAIN RUCINSKI  Date of Exam:   08/03/2021 Medical Rec #: [id number]         Accession #:    [id number] Date of Birth: 1990/06/17          Patient Gender: M Patient Age:   31 years Exam Location:  [facility] Procedure:      VAS [place] LOWER EXTREMITY VENOUS (DVT) Referring Phys: LESLIE SOFIA --------------------------------------------------------------------------------  Indications: Pain left leg.  Comparison Study: No prior studies. Performing Technologist: Darlin Coco RDMS, RVT  Examination Guidelines: A complete evaluation includes B-mode imaging, spectral Doppler, color Doppler, and power Doppler as  needed of all accessible portions of each vessel. Bilateral testing is considered an integral part of a complete examination. Limited examinations for reoccurring indications may be performed as noted. The reflux portion of the exam is performed with the patient in reverse Trendelenburg.  +-----+---------------+---------+-----------+----------+--------------+ RIGHTCompressibilityPhasicitySpontaneityPropertiesThrombus Aging +-----+---------------+---------+-----------+----------+--------------+ CFV  Full           Yes      Yes                                 +-----+---------------+---------+-----------+----------+--------------+   +---------+---------------+---------+-----------+----------+--------------+ LEFT     CompressibilityPhasicitySpontaneityPropertiesThrombus Aging +---------+---------------+---------+-----------+----------+--------------+ CFV      None           No  No                   Acute          +---------+---------------+---------+-----------+----------+--------------+ SFJ      None           No       No                   Acute          +---------+---------------+---------+-----------+----------+--------------+ FV Prox  None           No       No                   Acute          +---------+---------------+---------+-----------+----------+--------------+ FV Mid   None           No       No                   Acute          +---------+---------------+---------+-----------+----------+--------------+ FV DistalNone           No       No                   Acute          +---------+---------------+---------+-----------+----------+--------------+ PFV      None           No       No                   Acute          +---------+---------------+---------+-----------+----------+--------------+ POP      None           No       No                   Acute          +---------+---------------+---------+-----------+----------+--------------+ PTV       None           No       No                   Acute          +---------+---------------+---------+-----------+----------+--------------+ PERO     None           No       No                   Acute          +---------+---------------+---------+-----------+----------+--------------+ Gastroc  None           No       No                   Acute          +---------+---------------+---------+-----------+----------+--------------+     Summary: RIGHT: - No evidence of common femoral vein obstruction.  LEFT: - Findings consistent with acute deep vein thrombosis involving the left common femoral vein, SF junction, left femoral vein, left proximal profunda vein, left popliteal vein, left posterior tibial veins, left peroneal veins, and left gastrocnemius veins. - No cystic structure found in the popliteal fossa. - Possible obstruction proximal to the inguinal ligament.  *See table(s) above for measurements and observations. Electronically signed by Mark Mcdaniel on 08/03/2021 at 7:09:29 PM.    Final    DG Hip Unilat  W or Wo Pelvis 2-3 Views Left  Result Date: 08/03/2021 CLINICAL DATA:  Left hip pain EXAM: DG HIP (WITH OR WITHOUT PELVIS) 2-3V LEFT COMPARISON:  None Available. FINDINGS: There is no evidence of hip fracture or dislocation. There is no evidence of arthropathy or other focal bone abnormality. IMPRESSION: Negative. Electronically Signed   By: Davina Poke D.O.   On: 08/03/2021 17:22     TODAY-DAY OF DISCHARGE:  Subjective:   Mark Mcdaniel today has no headache,no chest abdominal pain,no new weakness tingling or numbness, feels much better wants to go home today.  Objective:   Blood pressure 135/81, pulse 92, temperature 98 F (36.7 C), temperature source Oral, resp. rate 20, weight 57.2 kg, SpO2 100 %.  Intake/Output Summary (Last 24 hours) at 08/07/2021 0919 Last data filed at 08/07/2021 0001 Gross per 24 hour  Intake 250 ml  Output 1200 ml  Net -950 ml   Filed Weights    08/05/21 0500 08/06/21 1317 08/07/21 0359  Weight: 56.9 kg 57.6 kg 57.2 kg    Exam: Awake Alert, Oriented *3, No new F.N deficits, Normal affect [place].AT,PERRAL Supple Neck,No JVD, No cervical lymphadenopathy appriciated.  Symmetrical Chest wall movement, Good air movement bilaterally, CTAB RRR,No Gallops,Rubs or new Murmurs, No Parasternal Heave +ve B.Sounds, Abd Soft, Non tender, No organomegaly appriciated, No rebound -guarding or rigidity. No Cyanosis, Clubbing or edema, No new Rash or bruise   PERTINENT RADIOLOGIC STUDIES: PERIPHERAL VASCULAR CATHETERIZATION  Result Date: 08/05/2021 Images from the original result were not included. Patient name: Mark Mcdaniel MRN: [id number] DOB: Dec 23, 1990 Sex: male 08/05/2021 Pre-operative Diagnosis: Left lower extremity iliofemoral deep venous thrombosis Post-operative diagnosis:  Same Surgeon:  Mark John, MD Procedure Performed: 1.  Ultrasound-guided micropuncture access of the left popliteal vein 2.  Left lower extremity venogram 3.  IlioCaval venogram 4.  Percutaneous mechanical thrombectomy using Inari clot Treiver 5.  Intravascular ultrasound 6.  Access managed with Monocryl suture 7.  Moderate sedation time 45 minutes 8.  Contrast 50 mL Indications: This is a 31 y.o. male with extensive left lower extremity acute DVT extending from the common iliac vein through the tibial veins.  He does not have phlegmasia.  He is having significant pain.  No sensorimotor deficits.  I had a long conversation with Luvern and his mother regarding percutaneous thrombectomy in an effort to relieve the thrombotic burden and improve pain symptoms. Findings: Thrombus extending from the left popliteal vein access site through the left common iliac vein.  No caval thrombus appreciated  Procedure: Patient was brought to the OR laid prone.  The patient was prepped and draped in standard fashion and a timeout was performed. I obtained access in the left popliteal vein using an  ultrasound-guided micropuncture needle. From this access, a wire was run into the inferior vena cava.  An 8 French sheath was placed into the popliteal fossa and left leg venogram, cavogram followed.  This demonstrated thrombus throughout the left leg and left iliac system.  The cava demonstrated no thrombus.  An Amplatz wire was parked in the left subclavian vein, and the iliac confluence was marked using intravascular ultrasound.  Next, the Inari clot Mark Mcdaniel was brought onto the field.  The patient was heparinized. The 16 French sheath was placed in the popliteal fossa and the clot Mark Mcdaniel was deployed in standard fashion with multiple passes from the inferior vena cava through the popliteal vein in the 12 o'clock position, 3 o'clock position, 6 o'clock position, 9 o'clock  position.  There was significant thrombotic return.  The last pass had very little thrombotic burden appreciated in the basket. Follow-up venogram demonstrated excellent result with excellent flow through the left femoral and iliac system.  IVUS was brought onto the field to assess for iliac vein compression.  There was a moderate amount of compression appreciated, however I elected not to stent the lesion due to the patient's comorbidities including alcohol abuse, drug abuse, and concern for medication noncompliance. I had a long discussion with him regarding the above.  Should he re-thrombose, he would require stenting.  My working diagnosis is provoked DVT from immobility over the last week.  He will need hematology follow-up for hypercoagulable work-up as well. Mark Santee, MD [facility] Office: [phone]   CARDIAC CATHETERIZATION  Result Date: 08/05/2021 Images from the original result were not included. Patient name: Mark Mcdaniel MRN: [id number] DOB: [birth year] Sex: male 08/05/2021 Pre-operative Diagnosis: Left lower extremity iliofemoral deep venous thrombosis Post-operative diagnosis:  Same  Surgeon:  Mark John, MD Procedure Performed: 1.  Ultrasound-guided micropuncture access of the left popliteal vein 2.  Left lower extremity venogram 3.  IlioCaval venogram 4.  Percutaneous mechanical thrombectomy using Inari clot Treiver 5.  Intravascular ultrasound 6.  Access managed with Monocryl suture 7.  Moderate sedation time 45 minutes 8.  Contrast 50 mL Indications: This is a 31 y.o. male with extensive left lower extremity acute DVT extending from the common iliac vein through the tibial veins.  He does not have phlegmasia.  He is having significant pain.  No sensorimotor deficits.  I had a long conversation with Jerrard and his mother regarding percutaneous thrombectomy in an effort to relieve the thrombotic burden and improve pain symptoms. Findings: Thrombus extending from the left popliteal vein access site through the left common iliac vein.  No caval thrombus appreciated  Procedure: Patient was brought to the OR laid prone.  The patient was prepped and draped in standard fashion and a timeout was performed. I obtained access in the left popliteal vein using an ultrasound-guided micropuncture needle. From this access, a wire was run into the inferior vena cava.  An 8 French sheath was placed into the popliteal fossa and left leg venogram, cavogram followed.  This demonstrated thrombus throughout the left leg and left iliac system.  The cava demonstrated no thrombus.  An Amplatz wire was parked in the left subclavian vein, and the iliac confluence was marked using intravascular ultrasound.  Next, the Inari clot Mark Mcdaniel was brought onto the field.  The patient was heparinized. The 16 French sheath was placed in the popliteal fossa and the clot Mark Mcdaniel was deployed in standard fashion with multiple passes from the inferior vena cava through the popliteal vein in the 12 o'clock position, 3 o'clock position, 6 o'clock position, 9 o'clock position.  There was significant thrombotic return.  The last  pass had very little thrombotic burden appreciated in the basket. Follow-up venogram demonstrated excellent result with excellent flow through the left femoral and iliac system.  IVUS was brought onto the field to assess for iliac vein compression.  There was a moderate amount of compression appreciated, however I elected not to stent the lesion due to the patient's comorbidities including alcohol abuse, drug abuse, and concern for medication noncompliance. I had a long discussion with him regarding the above.  Should he re-thrombose, he would require stenting.  My working diagnosis is provoked DVT from immobility over the last week.  He will need hematology follow-up for hypercoagulable work-up as well. Mark Santee, MD [facility] Office: [phone]     PERTINENT LAB RESULTS: CBC: Recent Labs    08/06/21 0054 08/06/21 0825 08/07/21 0322  WBC 13.3*  --  9.8  HGB 6.6* 8.0* 8.2*  HCT 23.4* 27.0* 27.8*  PLT 427*  --  454*   CMET CMP     Component Value Date/Time   NA 135 08/07/2021 0322   K 3.7 08/07/2021 0322   CL 101 08/07/2021 0322   CO2 24 08/07/2021 0322   GLUCOSE 103 (H) 08/07/2021 0322   BUN <5 (L) 08/07/2021 0322   CREATININE 0.54 (L) 08/07/2021 0322   CREATININE 0.85 09/23/2015 1720   CALCIUM 8.6 (L) 08/07/2021 0322   PROT 6.3 (L) 08/04/2021 0430   ALBUMIN 2.8 (L) 08/04/2021 0430   AST 11 (L) 08/04/2021 0430   ALT 7 08/04/2021 0430   ALKPHOS 50 08/04/2021 0430   BILITOT 0.6 08/04/2021 0430   GFRNONAA >60 08/07/2021 0322   GFRAA >60 07/08/2018 0759    GFR Estimated Creatinine Clearance: 109.2 mL/min (A) (by C-G formula based on SCr of 0.54 mg/dL (L)). No results for input(s): "LIPASE", "AMYLASE" in the last 72 hours. No results for input(s): "CKTOTAL", "CKMB", "CKMBINDEX", "TROPONINI" in the last 72 hours. Invalid input(s): "POCBNP" No results for input(s): "DDIMER" in the last 72 hours. No results for input(s): "HGBA1C" in the last  72 hours. No results for input(s): "CHOL", "HDL", "LDLCALC", "TRIG", "CHOLHDL", "LDLDIRECT" in the last 72 hours. No results for input(s): "TSH", "T4TOTAL", "T3FREE", "THYROIDAB" in the last 72 hours.  Invalid input(s): "FREET3" Recent Labs    08/06/21 0054  VITAMINB12 241  FOLATE 13.5   Coags: No results for input(s): "INR" in the last 72 hours.  Invalid input(s): "PT" Microbiology: Recent Results (from the past 240 hour(s))  Culture, blood (routine x 2)     Status: None (Preliminary result)   Collection Time: 08/03/21  7:18 PM   Specimen: BLOOD LEFT ARM  Result Value Ref Range Status   Specimen Description BLOOD LEFT ARM  Final   Special Requests   Final    BOTTLES DRAWN AEROBIC AND ANAEROBIC Blood Culture adequate volume   Culture   Final    NO GROWTH 3 DAYS Performed at [facility], [address], [place], [place] [address]    Report Status PENDING  Incomplete  Culture, blood (routine x 2)     Status: None (Preliminary result)   Collection Time: 08/03/21  8:05 PM   Specimen: BLOOD RIGHT ARM  Result Value Ref Range Status   Specimen Description BLOOD RIGHT ARM  Final   Special Requests   Final    BOTTLES DRAWN AEROBIC AND ANAEROBIC Blood Culture adequate volume   Culture   Final    NO GROWTH 3 DAYS Performed at [facility], [employer] [address], [place], [place] [address]    Report Status PENDING  Incomplete  Surgical pcr screen     Status: None   Collection Time: 08/05/21 12:25 AM   Specimen: Nasal Mucosa; Nasal Swab  Result Value Ref Range Status   MRSA, PCR NEGATIVE NEGATIVE Final   Staphylococcus aureus NEGATIVE NEGATIVE Final    Comment: (NOTE) The Xpert SA Assay (FDA approved for NASAL specimens in patients 76 years of age and older), is one component of a comprehensive surveillance program. It is not intended to diagnose infection nor to guide or  monitor treatment. Performed at [facility], [place] [address] [place], [place] [address]      FURTHER DISCHARGE INSTRUCTIONS:  Get Medicines reviewed and adjusted: Please take all your medications with you for your next visit with your Primary MD  Laboratory/radiological data: Please request your Primary MD to go over all hospital tests and procedure/radiological results at the follow up, please ask your Primary MD to get all Hospital records sent to his/her office.  In some cases, they will be blood work, cultures and biopsy results pending at the time of your discharge. Please request that your primary care M.D. goes through all the records of your hospital data and follows up on these results.  Also Note the following: If you experience worsening of your admission symptoms, develop shortness of breath, life threatening emergency, suicidal or homicidal thoughts you must seek medical attention immediately by calling 911 or calling your MD immediately  if symptoms less severe.  You must read complete instructions/literature along with all the possible adverse reactions/side effects for all the Medicines you take and that have been prescribed to you. Take any new Medicines after you have completely understood and accpet all the possible adverse reactions/side effects.   Do not drive when taking Pain medications or sleeping medications (Benzodaizepines)  Do not take more than prescribed Pain, Sleep and Anxiety Medications. It is not advisable to combine anxiety,sleep and pain medications without talking with your primary care practitioner  Special Instructions: If you have smoked or chewed Tobacco  in the last 2 yrs please stop smoking, stop any regular Alcohol  and or any Recreational drug use.  Wear Seat belts while driving.  Please note: You were cared for by a hospitalist during your hospital stay. Once you are discharged, your primary care physician will handle any further medical issues. Please note that NO REFILLS for any discharge medications will be authorized once you  are discharged, as it is imperative that you return to your primary care physician (or establish a relationship with a primary care physician if you do not have one) for your post hospital discharge needs so that they can reassess your need for medications and monitor your lab values.  Total Time spent coordinating discharge including counseling, education and face to face time equals greater than 30 minutes.  SignedOren Mcdaniel 08/07/2021 9:19 AM

## 2021-08-07 NOTE — Telephone Encounter (Signed)
Scheduled appt per 6/9 referral. Pt is aware of appt date and time. Pt is aware to arrive 15 mins prior to appt time and to bring and updated insurance card. Pt is aware of appt location.   

## 2021-08-08 LAB — CULTURE, BLOOD (ROUTINE X 2)
Culture: NO GROWTH
Culture: NO GROWTH
Special Requests: ADEQUATE
Special Requests: ADEQUATE

## 2021-08-10 ENCOUNTER — Other Ambulatory Visit: Payer: Self-pay

## 2021-08-10 DIAGNOSIS — D509 Iron deficiency anemia, unspecified: Secondary | ICD-10-CM

## 2021-08-10 NOTE — Telephone Encounter (Signed)
Pt was made aware of Carl Best NP recommendations. Orders for lab placed in Epic. Pt  made aware to have lab drawn on 08/14/2021. Location to lab given. Pt was scheduled for an office visit with Carl Best NP on July 6th at 10:00: Pt made aware.  Pt verbalized understanding with all questions answered.

## 2021-08-10 NOTE — Addendum Note (Signed)
Addended by: Gatha Mayer on: 08/10/2021 08:05 PM   Modules accepted: Orders

## 2021-08-10 NOTE — Telephone Encounter (Addendum)
I added the following labs for 6/16  tissue transglutaminase Ab (IgA) and an IgA level,   He could have celiac disease which would solve things.  I doubt I will want to do any procedures on Eliquis.  He will probably have to wait until he could hold the Eliquis to have endoscopic evaluation

## 2021-08-14 ENCOUNTER — Other Ambulatory Visit (INDEPENDENT_AMBULATORY_CARE_PROVIDER_SITE_OTHER): Payer: Self-pay

## 2021-08-14 DIAGNOSIS — D509 Iron deficiency anemia, unspecified: Secondary | ICD-10-CM

## 2021-08-14 LAB — CBC WITH DIFFERENTIAL/PLATELET
Basophils Absolute: 0 10*3/uL (ref 0.0–0.1)
Basophils Relative: 0.4 % (ref 0.0–3.0)
Eosinophils Absolute: 0.4 10*3/uL (ref 0.0–0.7)
Eosinophils Relative: 4.1 % (ref 0.0–5.0)
HCT: 34.7 % — ABNORMAL LOW (ref 39.0–52.0)
Hemoglobin: 10.5 g/dL — ABNORMAL LOW (ref 13.0–17.0)
Lymphocytes Relative: 31.3 % (ref 12.0–46.0)
Lymphs Abs: 3.4 10*3/uL (ref 0.7–4.0)
MCHC: 30.2 g/dL (ref 30.0–36.0)
MCV: 70.8 fl — ABNORMAL LOW (ref 78.0–100.0)
Monocytes Absolute: 0.7 10*3/uL (ref 0.1–1.0)
Monocytes Relative: 6.6 % (ref 3.0–12.0)
Neutro Abs: 6.2 10*3/uL (ref 1.4–7.7)
Neutrophils Relative %: 57.6 % (ref 43.0–77.0)
Platelets: 766 10*3/uL — ABNORMAL HIGH (ref 150.0–400.0)
RBC: 4.9 Mil/uL (ref 4.22–5.81)
RDW: 22.9 % — ABNORMAL HIGH (ref 11.5–15.5)
WBC: 10.8 10*3/uL — ABNORMAL HIGH (ref 4.0–10.5)

## 2021-08-19 LAB — TISSUE TRANSGLUTAMINASE, IGA: (tTG) Ab, IgA: <1 U/mL

## 2021-08-19 LAB — IGA: Immunoglobulin A: 478 mg/dL — ABNORMAL HIGH (ref 47–310)

## 2021-08-21 ENCOUNTER — Other Ambulatory Visit (HOSPITAL_COMMUNITY): Payer: Self-pay

## 2021-08-21 ENCOUNTER — Telehealth (HOSPITAL_COMMUNITY): Payer: Self-pay

## 2021-08-23 ENCOUNTER — Other Ambulatory Visit: Payer: Self-pay

## 2021-08-23 DIAGNOSIS — I82422 Acute embolism and thrombosis of left iliac vein: Secondary | ICD-10-CM

## 2021-08-25 NOTE — Patient Instructions (Addendum)
Please contact your local pharmacy, previous provider, or insurance carrier for vaccine/immunization records. Ensure that any procedures done outside of [facility] are faxed to [place] [phone] or you can sign release of records form at the front desk to keep your medical record updated.   Please get tetanus vaccine at you pharmacy

## 2021-08-26 ENCOUNTER — Encounter: Payer: Self-pay | Admitting: Family

## 2021-08-26 ENCOUNTER — Other Ambulatory Visit: Payer: Self-pay

## 2021-08-26 ENCOUNTER — Ambulatory Visit (INDEPENDENT_AMBULATORY_CARE_PROVIDER_SITE_OTHER): Payer: Self-pay | Admitting: Family

## 2021-08-26 ENCOUNTER — Inpatient Hospital Stay: Payer: Self-pay

## 2021-08-26 ENCOUNTER — Inpatient Hospital Stay: Payer: Self-pay | Attending: Hematology | Admitting: Hematology

## 2021-08-26 VITALS — BP 136/98 | HR 77 | Temp 98.8°F | Resp 16 | Ht 71.0 in | Wt 126.5 lb

## 2021-08-26 VITALS — BP 120/70 | HR 84 | Temp 98.2°F | Resp 16 | Ht 71.0 in | Wt 126.6 lb

## 2021-08-26 DIAGNOSIS — F1210 Cannabis abuse, uncomplicated: Secondary | ICD-10-CM

## 2021-08-26 DIAGNOSIS — I82422 Acute embolism and thrombosis of left iliac vein: Secondary | ICD-10-CM

## 2021-08-26 DIAGNOSIS — D509 Iron deficiency anemia, unspecified: Secondary | ICD-10-CM

## 2021-08-26 DIAGNOSIS — I2699 Other pulmonary embolism without acute cor pulmonale: Secondary | ICD-10-CM | POA: Insufficient documentation

## 2021-08-26 DIAGNOSIS — Z7689 Persons encountering health services in other specified circumstances: Secondary | ICD-10-CM

## 2021-08-26 DIAGNOSIS — Z800 Family history of malignant neoplasm of digestive organs: Secondary | ICD-10-CM | POA: Insufficient documentation

## 2021-08-26 DIAGNOSIS — Z86711 Personal history of pulmonary embolism: Secondary | ICD-10-CM

## 2021-08-26 DIAGNOSIS — Z7901 Long term (current) use of anticoagulants: Secondary | ICD-10-CM | POA: Insufficient documentation

## 2021-08-26 DIAGNOSIS — F17210 Nicotine dependence, cigarettes, uncomplicated: Secondary | ICD-10-CM | POA: Insufficient documentation

## 2021-08-26 DIAGNOSIS — E538 Deficiency of other specified B group vitamins: Secondary | ICD-10-CM

## 2021-08-26 DIAGNOSIS — Z86718 Personal history of other venous thrombosis and embolism: Secondary | ICD-10-CM

## 2021-08-26 DIAGNOSIS — D75839 Thrombocytosis, unspecified: Secondary | ICD-10-CM | POA: Insufficient documentation

## 2021-08-26 DIAGNOSIS — Z23 Encounter for immunization: Secondary | ICD-10-CM

## 2021-08-26 DIAGNOSIS — Z803 Family history of malignant neoplasm of breast: Secondary | ICD-10-CM | POA: Insufficient documentation

## 2021-08-26 DIAGNOSIS — F1020 Alcohol dependence, uncomplicated: Secondary | ICD-10-CM

## 2021-08-26 LAB — CBC WITH DIFFERENTIAL (CANCER CENTER ONLY)
Abs Immature Granulocytes: 0.03 10*3/uL (ref 0.00–0.07)
Basophils Absolute: 0 10*3/uL (ref 0.0–0.1)
Basophils Relative: 0 %
Eosinophils Absolute: 0 10*3/uL (ref 0.0–0.5)
Eosinophils Relative: 0 %
HCT: 42.7 % (ref 39.0–52.0)
Hemoglobin: 12.7 g/dL — ABNORMAL LOW (ref 13.0–17.0)
Immature Granulocytes: 0 %
Lymphocytes Relative: 21 %
Lymphs Abs: 1.9 10*3/uL (ref 0.7–4.0)
MCH: 23 pg — ABNORMAL LOW (ref 26.0–34.0)
MCHC: 29.7 g/dL — ABNORMAL LOW (ref 30.0–36.0)
MCV: 77.5 fL — ABNORMAL LOW (ref 80.0–100.0)
Monocytes Absolute: 0.6 10*3/uL (ref 0.1–1.0)
Monocytes Relative: 7 %
Neutro Abs: 6.4 10*3/uL (ref 1.7–7.7)
Neutrophils Relative %: 72 %
Platelet Count: 460 10*3/uL — ABNORMAL HIGH (ref 150–400)
RBC: 5.51 MIL/uL (ref 4.22–5.81)
RDW: 28 % — ABNORMAL HIGH (ref 11.5–15.5)
WBC Count: 9 10*3/uL (ref 4.0–10.5)
nRBC: 0 % (ref 0.0–0.2)

## 2021-08-26 LAB — CMP (CANCER CENTER ONLY)
ALT: 10 U/L (ref 0–44)
AST: 14 U/L — ABNORMAL LOW (ref 15–41)
Albumin: 4.7 g/dL (ref 3.5–5.0)
Alkaline Phosphatase: 63 U/L (ref 38–126)
Anion gap: 9 (ref 5–15)
BUN: 12 mg/dL (ref 6–20)
CO2: 28 mmol/L (ref 22–32)
Calcium: 10 mg/dL (ref 8.9–10.3)
Chloride: 101 mmol/L (ref 98–111)
Creatinine: 0.73 mg/dL (ref 0.61–1.24)
GFR, Estimated: >60 mL/min (ref >60)
Glucose, Bld: 81 mg/dL (ref 70–99)
Potassium: 4 mmol/L (ref 3.5–5.1)
Sodium: 138 mmol/L (ref 135–145)
Total Bilirubin: 0.7 mg/dL (ref 0.3–1.2)
Total Protein: 8 g/dL (ref 6.5–8.1)

## 2021-08-26 LAB — ANTITHROMBIN III: AntiThromb III Func: 88 % (ref 75–120)

## 2021-08-26 MED ORDER — TETANUS-DIPHTH-ACELL PERTUSSIS 5-2.5-18.5 LF-MCG/0.5 IM SUSP: 0.5000 mL | Freq: Once | INTRAMUSCULAR | 0 refills | Status: AC

## 2021-08-26 NOTE — Progress Notes (Addendum)
Marland Kitchen   HEMATOLOGY/ONCOLOGY CONSULTATION NOTE  Date of Service: 08/26/2021  Patient Care Team: [name], [name], NP as PCP - General (Family Medicine)  CHIEF COMPLAINTS/PURPOSE OF CONSULTATION:  Extensive DVT and bilateral PE  HISTORY OF PRESENTING ILLNESS:   Mark Mcdaniel is a wonderful 31 y.o. male who has been referred to [place] by Dr .Kelby Fam, [name], NP for evaluation and management of recent extensive DVT and PE.  Patient has a history of ADHD, asthma, depression, active tobacco abuse smoking 1/2 packs of cigarettes per day, history of cocaine and THC abuse and history of significant alcohol abuse. He notes he had lost his job and was less active and presented to the hospital in early June 2023 with progressive left leg pain which on evaluation showed extensive left lower extremity DVT and bilateral PE.  He had a thrombectomy of his left lower extremity on 08/05/2021.  He was initially on IV heparin and then was transitioned to Eliquis on discharge.  His initial ultrasound on 08/03/2021 showed acute DVT involving the left common femoral vein saphenofemoral junction left femoral vein left proximal profunda vein, left popliteal vein left posterior tibial vein and left peroneal vein and left gastrocnemius vein.  Patient notes that he did not have any shortness of breath or chest pain. He notes his left lower extremity swelling is much improved.  He is tolerating his anticoagulation without any bleeding issues.  His extensive DVT and PE was thought to be due to polysubstance abuse including heavy smoking and dehydration from alcohol abuse but could not rule out May Thurner syndrome. He also notes that he has a  mat cousin with possible genetic disorder causing blood clots @ 76 yrs of age.   Currently still smoking half a pack of cigarettes per day.  Has cut down alcohol use but has not completely quit yet. He notes he has been sober from [facility] and cocaine for about 3 to 4 years.  No other  acute new focal symptoms suggestive of malignancy.  MEDICAL HISTORY:  Past Medical History:  Diagnosis Date   ADHD (attention deficit hyperactivity disorder)    Alcohol abuse    Alcohol withdrawal syndrome without complication (HCC)    Alcoholic ketoacidosis 04/10/4763   Anxiety    Asthma    Depression    suicidal in past   History of adenomatous polyp of colon 01/04/2019   Right clavicle fracture 11/2018    SURGICAL HISTORY: Past Surgical History:  Procedure Laterality Date   HERNIA REPAIR     Performed when 6 months old   INTRAVASCULAR ULTRASOUND/IVUS N/A 08/05/2021   Procedure: Intravascular Ultrasound/IVUS;  Surgeon: Broadus John, MD;  Location: [facility];  Service: Cardiovascular;  Laterality: N/A;   IVC VENOGRAPHY Left 08/05/2021   Procedure: IVC Venography;  Surgeon: Broadus John, MD;  Location: [facility];  Service: Cardiovascular;  Laterality: Left;   LOWER EXTREMITY VENOGRAPHY Left 08/05/2021   Procedure: LOWER EXTREMITY VENOGRAPHY;  Surgeon: Broadus John, MD;  Location: [facility];  Service: Cardiovascular;  Laterality: Left;   PERIPHERAL VASCULAR THROMBECTOMY Left 08/05/2021   Procedure: PERIPHERAL VASCULAR THROMBECTOMY;  Surgeon: Broadus John, MD;  Location: [facility];  Service: Cardiovascular;  Laterality: Left;    SOCIAL HISTORY: Social History   Socioeconomic History   Marital status: Single    Spouse name: Not on file   Number of children: 1   Years of education: Not on file  Highest education level: Not on file  Occupational History   Not on file  Tobacco Use   Smoking status: Every Day    Packs/day: 1.50    Years: 15.00    Total pack years: 22.50    Types: Cigarettes   Smokeless tobacco: Never  Vaping Use   Vaping Use: Never used  Substance and Sexual Activity   Alcohol use: Yes   Drug use: Yes    Frequency: 1.0 times per week    Types: Marijuana   Sexual activity: Not Currently  Other Topics Concern    Not on file  Social History Narrative   Not on file   Social Determinants of Health   Financial Resource Strain: Not on file  Food Insecurity: Not on file  Transportation Needs: Not on file  Physical Activity: Not on file  Stress: Not on file  Social Connections: Not on file  Intimate Partner Violence: Not on file    FAMILY HISTORY: Family History  Problem Relation Age of Onset   Diabetes Maternal Grandmother    Kidney disease Maternal Grandmother    Breast cancer Paternal Grandmother    Heart disease Other    Colon cancer Cousin    Esophageal cancer Neg Hx    Rectal cancer Neg Hx    Stomach cancer Neg Hx     ALLERGIES:  is allergic to tape and tea.  MEDICATIONS:  Current Outpatient Medications  Medication Sig Dispense Refill   apixaban (ELIQUIS) 5 MG TABS tablet Take 2 tablets (10 mg) by mouth twice daily for 7 days, then switch to 1 tablet (5 mg) by mouth twice daily 120 tablet 3   ferrous sulfate 325 (65 FE) MG tablet Take 1 tablet (325 mg total) by mouth 2 (two) times daily. 60 tablet 3   thiamine 100 MG tablet Take 1 tablet (100 mg total) by mouth daily. 30 tablet 3   vitamin B-12 (CYANOCOBALAMIN) 1000 MCG tablet Take 1 tablet (1,000 mcg total) by mouth daily. 30 tablet 3   pantoprazole (PROTONIX) 40 MG tablet Take 1 tablet (40 mg total) by mouth daily. (Patient not taking: Reported on 08/26/2021) 30 tablet 3   Tdap (BOOSTRIX) 5-2.5-18.5 LF-MCG/0.5 injection Inject 0.5 mLs into the muscle once for 1 dose. 0.5 mL 0   No current facility-administered medications for this visit.    REVIEW OF SYSTEMS:    10 Point review of Systems was done is negative except as noted above.  PHYSICAL EXAMINATION: ECOG PERFORMANCE STATUS: 1 - Symptomatic but completely ambulatory  . Vitals:   08/26/21 1126  BP: (!) 136/98  Pulse: 77  Resp: 16  Temp: 98.8 F (37.1 C)  SpO2: 100%   Filed Weights   08/26/21 1126  Weight: 126 lb 8 oz (57.4 kg)   .Body mass index is 17.64  kg/m.  GENERAL:alert, in no acute distress and comfortable SKIN: no acute rashes, no significant lesions EYES: conjunctiva are pink and non-injected, sclera anicteric OROPHARYNX: MMM, no exudates, no oropharyngeal erythema or ulceration NECK: supple, no JVD LYMPH:  no palpable lymphadenopathy in the cervical, axillary or inguinal regions LUNGS: clear to auscultation b/l with normal respiratory effort HEART: regular rate & rhythm ABDOMEN:  normoactive bowel sounds , non tender, not distended. Extremity: no pedal edema PSYCH: alert & oriented x 3 with fluent speech NEURO: no focal motor/sensory deficits  LABORATORY DATA:  I have reviewed the data as listed  .    Latest Ref Rng & Units 08/14/2021   12:47  PM 08/07/2021    3:22 AM 08/06/2021    8:25 AM  CBC  WBC 4.0 - 10.5 K/uL 10.8  9.8    Hemoglobin 13.0 - 17.0 g/dL 10.5  8.2  8.0   Hematocrit 39.0 - 52.0 % 34.7  27.8  27.0   Platelets 150.0 - 400.0 K/uL 766.0  454      .    Latest Ref Rng & Units 08/07/2021    3:22 AM 08/04/2021    4:30 AM 08/03/2021    4:59 PM  CMP  Glucose 70 - 99 mg/dL 103  105  105   BUN 6 - 20 mg/dL '5  7  8   '$ Creatinine 0.61 - 1.24 mg/dL 0.54  0.67  0.69   Sodium 135 - 145 mmol/L 135  134  132   Potassium 3.5 - 5.1 mmol/L 3.7  3.7  3.8   Chloride 98 - 111 mmol/L 101  105  100   CO2 22 - 32 mmol/L '24  22  20   '$ Calcium 8.9 - 10.3 mg/dL 8.6  8.3  8.9   Total Protein 6.5 - 8.1 g/dL  6.3  7.6   Total Bilirubin 0.3 - 1.2 mg/dL  0.6  0.8   Alkaline Phos 38 - 126 U/L  50  67   AST 15 - 41 U/L  11  14   ALT 0 - 44 U/L  7  10      RADIOGRAPHIC STUDIES: I have personally reviewed the radiological images as listed and agreed with the findings in the report. PERIPHERAL VASCULAR CATHETERIZATION  Result Date: 08/05/2021 Images from the original result were not included. Patient name: Hao Dion MRN: [id number] DOB: 17-Feb-1991 Sex: male 08/05/2021 Pre-operative Diagnosis: Left lower extremity iliofemoral deep  venous thrombosis Post-operative diagnosis:  Same Surgeon:  Broadus John, MD Procedure Performed: 1.  Ultrasound-guided micropuncture access of the left popliteal vein 2.  Left lower extremity venogram 3.  IlioCaval venogram 4.  Percutaneous mechanical thrombectomy using Inari clot Treiver 5.  Intravascular ultrasound 6.  Access managed with Monocryl suture 7.  Moderate sedation time 45 minutes 8.  Contrast 50 mL Indications: This is a 31 y.o. male with extensive left lower extremity acute DVT extending from the common iliac vein through the tibial veins.  He does not have phlegmasia.  He is having significant pain.  No sensorimotor deficits.  I had a long conversation with Pavle and his mother regarding percutaneous thrombectomy in an effort to relieve the thrombotic burden and improve pain symptoms. Findings: Thrombus extending from the left popliteal vein access site through the left common iliac vein.  No caval thrombus appreciated  Procedure: Patient was brought to the OR laid prone.  The patient was prepped and draped in standard fashion and a timeout was performed. I obtained access in the left popliteal vein using an ultrasound-guided micropuncture needle. From this access, a wire was run into the inferior vena cava.  An 8 French sheath was placed into the popliteal fossa and left leg venogram, cavogram followed.  This demonstrated thrombus throughout the left leg and left iliac system.  The cava demonstrated no thrombus.  An Amplatz wire was parked in the left subclavian vein, and the iliac confluence was marked using intravascular ultrasound.  Next, the Inari clot Isaias Sakai was brought onto the field.  The patient was heparinized. The 20 French sheath was placed in the popliteal fossa and the clot Donnella Bi was deployed in standard fashion with multiple  passes from the inferior vena cava through the popliteal vein in the 12 o'clock position, 3 o'clock position, 6 o'clock position, 9 o'clock position.   There was significant thrombotic return.  The last pass had very little thrombotic burden appreciated in the basket. Follow-up venogram demonstrated excellent result with excellent flow through the left femoral and iliac system.  IVUS was brought onto the field to assess for iliac vein compression.  There was a moderate amount of compression appreciated, however I elected not to stent the lesion due to the patient's comorbidities including alcohol abuse, drug abuse, and concern for medication noncompliance. I had a long discussion with him regarding the above.  Should he re-thrombose, he would require stenting.  My working diagnosis is provoked DVT from immobility over the last week.  He will need hematology follow-up for hypercoagulable work-up as well. Cassandria Santee, MD [facility] Office: [phone]   CARDIAC CATHETERIZATION  Result Date: 08/05/2021 Images from the original result were not included. Patient name: Herman Fiero MRN: [id number] DOB: June 11, 1990 Sex: male 08/05/2021 Pre-operative Diagnosis: Left lower extremity iliofemoral deep venous thrombosis Post-operative diagnosis:  Same Surgeon:  Broadus John, MD Procedure Performed: 1.  Ultrasound-guided micropuncture access of the left popliteal vein 2.  Left lower extremity venogram 3.  IlioCaval venogram 4.  Percutaneous mechanical thrombectomy using Inari clot Treiver 5.  Intravascular ultrasound 6.  Access managed with Monocryl suture 7.  Moderate sedation time 45 minutes 8.  Contrast 50 mL Indications: This is a 31 y.o. male with extensive left lower extremity acute DVT extending from the common iliac vein through the tibial veins.  He does not have phlegmasia.  He is having significant pain.  No sensorimotor deficits.  I had a long conversation with Wildon and his mother regarding percutaneous thrombectomy in an effort to relieve the thrombotic burden and improve pain symptoms. Findings: Thrombus extending from the  left popliteal vein access site through the left common iliac vein.  No caval thrombus appreciated  Procedure: Patient was brought to the OR laid prone.  The patient was prepped and draped in standard fashion and a timeout was performed. I obtained access in the left popliteal vein using an ultrasound-guided micropuncture needle. From this access, a wire was run into the inferior vena cava.  An 8 French sheath was placed into the popliteal fossa and left leg venogram, cavogram followed.  This demonstrated thrombus throughout the left leg and left iliac system.  The cava demonstrated no thrombus.  An Amplatz wire was parked in the left subclavian vein, and the iliac confluence was marked using intravascular ultrasound.  Next, the Inari clot Isaias Sakai was brought onto the field.  The patient was heparinized. The 16 French sheath was placed in the popliteal fossa and the clot Donnella Bi was deployed in standard fashion with multiple passes from the inferior vena cava through the popliteal vein in the 12 o'clock position, 3 o'clock position, 6 o'clock position, 9 o'clock position.  There was significant thrombotic return.  The last pass had very little thrombotic burden appreciated in the basket. Follow-up venogram demonstrated excellent result with excellent flow through the left femoral and iliac system.  IVUS was brought onto the field to assess for iliac vein compression.  There was a moderate amount of compression appreciated, however I elected not to stent the lesion due to the patient's comorbidities including alcohol abuse, drug abuse, and concern for medication noncompliance. I had a long discussion with him regarding  the above.  Should he re-thrombose, he would require stenting.  My working diagnosis is provoked DVT from immobility over the last week.  He will need hematology follow-up for hypercoagulable work-up as well. Cassandria Santee, MD [facility] Office: [phone]    ECHOCARDIOGRAM COMPLETE  Result Date: 08/04/2021    ECHOCARDIOGRAM REPORT   Patient Name:   Mark Mcdaniel Notch Date of Exam: 08/04/2021 Medical Rec #:  [id number]        Height:       71.0 in Accession #:    [id number]       Weight:       135.0 lb Date of Birth:  [birth year]         BSA:          1.784 m Patient Age:    31 years         BP:           109/86 mmHg Patient Gender: M                HR:           101 bpm. Exam Location:  Inpatient Procedure: 2D Echo, Cardiac Doppler, Color Doppler and Strain Analysis Indications:    Pulmonary embolus  History:        Patient has no prior history of Echocardiogram examinations.  Sonographer:    [place] Referring Phys: [id number] [place]  1. Left ventricular ejection fraction, by estimation, is 55 to 60%. The left ventricle has normal function. The left ventricle has no regional wall motion abnormalities. Indeterminate diastolic filling due to E-A fusion.  2. Right ventricular systolic function is normal. The right ventricular size is normal. Tricuspid regurgitation signal is inadequate for assessing PA pressure.  3. The mitral valve is grossly normal. No evidence of mitral valve regurgitation. No evidence of mitral stenosis.  4. The aortic valve is tricuspid. Aortic valve regurgitation is not visualized. No aortic stenosis is present.  5. The inferior vena cava is normal in size with greater than 50% respiratory variability, suggesting right atrial pressure of 3 mmHg. Conclusion(s)/Recommendation(s): Normal biventricular function without evidence of hemodynamically significant valvular heart disease. FINDINGS  Left Ventricle: Left ventricular ejection fraction, by estimation, is 55 to 60%. The left ventricle has normal function. The left ventricle has no regional wall motion abnormalities. Global longitudinal strain performed but not reported based on interpreter judgement due to suboptimal tracking. The left ventricular internal cavity size was  normal in size. There is no left ventricular hypertrophy. Indeterminate diastolic filling due to E-A fusion. Right Ventricle: The right ventricular size is normal. No increase in right ventricular wall thickness. Right ventricular systolic function is normal. Tricuspid regurgitation signal is inadequate for assessing PA pressure. Left Atrium: Left atrial size was normal in size. Right Atrium: Right atrial size was normal in size. Pericardium: There is no evidence of pericardial effusion. Mitral Valve: The mitral valve is grossly normal. No evidence of mitral valve regurgitation. No evidence of mitral valve stenosis. MV peak gradient, 4.2 mmHg. The mean mitral valve gradient is 3.0 mmHg. Tricuspid Valve: The tricuspid valve is grossly normal. Tricuspid valve regurgitation is not demonstrated. No evidence of tricuspid stenosis. Aortic Valve: The aortic valve is tricuspid. Aortic valve regurgitation is not visualized. No aortic stenosis is present. Aortic valve mean gradient measures 3.0 mmHg. Aortic valve peak gradient measures 6.6 mmHg. Aortic valve area, by VTI measures 4.11 cm. Pulmonic Valve: The pulmonic  valve was grossly normal. Pulmonic valve regurgitation is not visualized. No evidence of pulmonic stenosis. Aorta: The aortic root and ascending aorta are structurally normal, with no evidence of dilitation. Venous: The inferior vena cava is normal in size with greater than 50% respiratory variability, suggesting right atrial pressure of 3 mmHg. IAS/Shunts: The atrial septum is grossly normal.  LEFT VENTRICLE PLAX 2D LVIDd:         4.90 cm     Diastology LVIDs:         3.20 cm     LV e' medial:    11.10 cm/s LV PW:         1.00 cm     LV E/e' medial:  8.3 LV IVS:        0.90 cm     LV e' lateral:   14.00 cm/s LVOT diam:     2.50 cm     LV E/e' lateral: 6.6 LV SV:         79 LV SV Index:   44 LVOT Area:     4.91 cm  LV Volumes (MOD) LV vol d, MOD A2C: 91.1 ml LV vol d, MOD A4C: 83.1 ml LV vol s, MOD A2C: 48.4 ml  LV vol s, MOD A4C: 32.4 ml LV SV MOD A2C:     42.7 ml LV SV MOD A4C:     83.1 ml LV SV MOD BP:      46.6 ml RIGHT VENTRICLE RV Basal diam:  4.00 cm RV Mid diam:    3.20 cm LEFT ATRIUM           Index        RIGHT ATRIUM          Index LA Vol (A4C): 18.5 ml 10.37 ml/m  RA Area:     6.96 cm                                    RA Volume:   10.90 ml 6.11 ml/m  AORTIC VALVE                    PULMONIC VALVE AV Area (Vmax):    4.26 cm     PV Vmax:       1.18 m/s AV Area (Vmean):   4.16 cm     PV Vmean:      84.700 cm/s AV Area (VTI):     4.11 cm     PV VTI:        0.203 m AV Vmax:           128.00 cm/s  PV Peak grad:  5.6 mmHg AV Vmean:          84.500 cm/s  PV Mean grad:  3.0 mmHg AV VTI:            0.192 m AV Peak Grad:      6.6 mmHg AV Mean Grad:      3.0 mmHg LVOT Vmax:         111.00 cm/s LVOT Vmean:        71.600 cm/s LVOT VTI:          0.160 m LVOT/AV VTI ratio: 0.84  AORTA Ao Root diam: 3.10 cm Ao Asc diam:  3.20 cm MITRAL VALVE MV Area (PHT): 4.63 cm    SHUNTS MV Area VTI:   3.63 cm    Systemic VTI:  0.16 m MV Peak grad:  4.2 mmHg    Systemic Diam: 2.50 cm MV Mean grad:  3.0 mmHg MV Vmax:       1.02 m/s MV Vmean:      75.9 cm/s MV Decel Time: 164 msec MV E velocity: 91.80 cm/s MV A velocity: 85.70 cm/s MV E/A ratio:  1.07 Eleonore Chiquito MD Electronically signed by Eleonore Chiquito MD Signature Date/Time: 08/04/2021/12:13:16 PM    Final    CT VENOGRAM ABD/PEL  Result Date: 08/03/2021 CLINICAL DATA:  Follow-up extensive DVT seen on ultrasound imaging. EXAM: CT VENOGRAM ABDOMEN AND PELVIS TECHNIQUE: Multidetector CT imaging of the abdomen and pelvis was performed using the standard protocol following bolus administration of intravenous contrast. RADIATION DOSE REDUCTION: This exam was performed according to the departmental dose-optimization program which includes automated exposure control, adjustment of the mA and/or kV according to patient size and/or use of iterative reconstruction technique. CONTRAST:  54m  OMNIPAQUE IOHEXOL 350 MG/ML SOLN COMPARISON:  November 16, 2016 FINDINGS: Lower chest: A moderate amount of intraluminal low attenuation is seen within the lower lobe branches of the right pulmonary artery. Hepatobiliary: No focal liver abnormality is seen. No gallstones, gallbladder wall thickening, or biliary dilatation. Pancreas: Unremarkable. No pancreatic ductal dilatation or surrounding inflammatory changes. Spleen: Normal in size without focal abnormality. Adrenals/Urinary Tract: Adrenal glands are unremarkable. Kidneys are normal, without renal calculi, focal lesion, or hydronephrosis. Bladder is unremarkable. Stomach/Bowel: Stomach is within normal limits. Appendix appears normal. No evidence of bowel wall thickening, distention, or inflammatory changes. Vascular/Lymphatic: The arterial structures within the abdomen pelvis are unremarkable. An extensive amount of intraluminal low attenuation is seen within the left common iliac vein. This originates from its origin and extends throughout the left external iliac vein and left internal iliac vein, with progression to involve the visualized portion of the left common femoral vein and deep left femoral vein. Associated perivascular inflammatory fat stranding is seen. No enlarged abdominal or pelvic lymph nodes. Reproductive: Prostate is unremarkable. Other: No abdominal wall hernia or abnormality. No abdominopelvic ascites. Musculoskeletal: No acute or significant osseous findings. IMPRESSION: 1. Moderate amount of pulmonary embolism within the lower lobe branches of the RIGHT pulmonary artery. 2. Extensive amount of occlusive thrombus within the LEFT common iliac vein, LEFT external iliac vein and LEFT internal iliac vein, with progression to involve the visualized portion of the LEFT common femoral vein and deep left femoral vein. Electronically Signed   By: TVirgina NorfolkM.D.   On: 08/03/2021 22:20   CT Angio Chest PE W and/or Wo Contrast  Addendum  Date: 08/03/2021   ADDENDUM REPORT: 08/03/2021 21:26 ADDENDUM: Critical findings were reported to Dr. LRayna Sextonat 9:25 p.m. Electronically Signed   By: LBrett FairyM.D.   On: 08/03/2021 21:26   Result Date: 08/03/2021 CLINICAL DATA:  Pulmonary embolism suspected, high probability. Tachycardia. EXAM: CT ANGIOGRAPHY CHEST WITH CONTRAST TECHNIQUE: Multidetector CT imaging of the chest was performed using the standard protocol during bolus administration of intravenous contrast. Multiplanar CT image reconstructions and MIPs were obtained to evaluate the vascular anatomy. RADIATION DOSE REDUCTION: This exam was performed according to the departmental dose-optimization program which includes automated exposure control, adjustment of the mA and/or kV according to patient size and/or use of iterative reconstruction technique. CONTRAST:  960mOMNIPAQUE IOHEXOL 350 MG/ML SOLN COMPARISON:  None Available. FINDINGS: Cardiovascular: Heart is normal in size and there is no pericardial effusion. The aorta and pulmonary trunk are normal in caliber. There are lobar,  segmental, and subsegmental pulmonary artery filling defects bilaterally with moderate to large emboli burden. No evidence of right heart strain. Mediastinum/Nodes: No enlarged mediastinal, hilar, or axillary lymph nodes. Thyroid gland, trachea, and esophagus demonstrate no significant findings. Lungs/Pleura: Minimal apical pleural scarring is noted bilaterally. No consolidation, effusion, or pneumothorax. Upper Abdomen: No acute abnormality. Musculoskeletal: No acute osseous abnormality. Review of the MIP images confirms the above findings. IMPRESSION: 1. Bilateral lobar, segmental, and subsegmental pulmonary emboli with moderate-to-large thrombus burden. No evidence of right heart strain. 2. No acute infiltrate in the lungs. Electronically Signed: By: Brett Fairy M.D. On: 08/03/2021 21:21   VAS [place] IVC/ILIAC (VENOUS ONLY)  Result Date: 08/03/2021 IVC/ILIAC  STUDY Patient Name:  Mark Mcdaniel  Date of Exam:   08/03/2021 Medical Rec #: [id number]         Accession #:    [id number] Date of Birth: May 28, 1990          Patient Gender: Patient Age:   64 years Exam Location:  [facility] Procedure:      VAS [place] IVC/ILIAC (VENOUS ONLY) Referring Phys: --------------------------------------------------------------------------------  Indications: Extensive left lower extremity DVT with extension above inguinal              ligament Limitations: Air/bowel gas.  Comparison Study: 08-03-2021 Left lower extremity venous study. Performing Technologist: Darlin Coco RDMS, RVT  Examination Guidelines: A complete evaluation includes B-mode imaging, spectral Doppler, color Doppler, and power Doppler as needed of all accessible portions of each vessel. Bilateral testing is considered an integral part of a complete examination. Limited examinations for reoccurring indications may be performed as noted.  IVC/Iliac Findings: +----------+------+--------+--------+    IVC    PatentThrombusComments +----------+------+--------+--------+ IVC Mid   patent                 +----------+------+--------+--------+ IVC Distalpatent                 +----------+------+--------+--------+  +-------------------+---------+-----------+---------+-----------+--------+         CIV        RT-PatentRT-ThrombusLT-PatentLT-ThrombusComments +-------------------+---------+-----------+---------+-----------+--------+ Common Iliac Prox   patent                         acute            +-------------------+---------+-----------+---------+-----------+--------+ Common Iliac Mid    patent                         acute            +-------------------+---------+-----------+---------+-----------+--------+ Common Iliac Distal patent                         acute            +-------------------+---------+-----------+---------+-----------+--------+   +-------------------------+---------+-----------+---------+-----------+--------+            EIV           RT-PatentRT-ThrombusLT-PatentLT-ThrombusComments +-------------------------+---------+-----------+---------+-----------+--------+ External Iliac Vein Prox  patent                         acute            +-------------------------+---------+-----------+---------+-----------+--------+ External Iliac Vein Mid   patent                         acute            +-------------------------+---------+-----------+---------+-----------+--------+ External Iliac Vein  patent                         acute            Distal                                                                    +-------------------------+---------+-----------+---------+-----------+--------+   Summary: IVC/Iliac: There is no evidence of thrombus involving the IVC. There is no evidence of thrombus involving the right common iliac vein. There is evidence of acute thrombus involving the left common iliac vein. There is no evidence of thrombus involving the right external iliac vein. There is evidence of acute thrombus involving the left external iliac vein. Visualization of proximal Inferior Vena Cava was limited.  *See table(s) above for measurements and observations.  Electronically signed by Orlie Pollen on 08/03/2021 at 7:09:53 PM.    Final    VAS [place] LOWER EXTREMITY VENOUS (DVT) (ONLY [facility] & [facility])  Result Date: 08/03/2021  Lower Venous DVT Study Patient Name:  MUZAMIL HARKER  Date of Exam:   08/03/2021 Medical Rec #: [id number]         Accession #:    [id number] Date of Birth: [birth year]          Patient Gender: M Patient Age:   35 years Exam Location:  [facility] Procedure:      VAS [place] LOWER EXTREMITY VENOUS (DVT) Referring Phys: LESLIE SOFIA --------------------------------------------------------------------------------  Indications: Pain left leg.  Comparison Study: No prior studies. Performing  Technologist: Darlin Coco RDMS, RVT  Examination Guidelines: A complete evaluation includes B-mode imaging, spectral Doppler, color Doppler, and power Doppler as needed of all accessible portions of each vessel. Bilateral testing is considered an integral part of a complete examination. Limited examinations for reoccurring indications may be performed as noted. The reflux portion of the exam is performed with the patient in reverse Trendelenburg.  +-----+---------------+---------+-----------+----------+--------------+ RIGHTCompressibilityPhasicitySpontaneityPropertiesThrombus Aging +-----+---------------+---------+-----------+----------+--------------+ CFV  Full           Yes      Yes                                 +-----+---------------+---------+-----------+----------+--------------+   +---------+---------------+---------+-----------+----------+--------------+ LEFT     CompressibilityPhasicitySpontaneityPropertiesThrombus Aging +---------+---------------+---------+-----------+----------+--------------+ CFV      None           No       No                   Acute          +---------+---------------+---------+-----------+----------+--------------+ SFJ      None           No       No                   Acute          +---------+---------------+---------+-----------+----------+--------------+ FV Prox  None           No       No                   Acute          +---------+---------------+---------+-----------+----------+--------------+  FV Mid   None           No       No                   Acute          +---------+---------------+---------+-----------+----------+--------------+ FV DistalNone           No       No                   Acute          +---------+---------------+---------+-----------+----------+--------------+ PFV      None           No       No                   Acute           +---------+---------------+---------+-----------+----------+--------------+ POP      None           No       No                   Acute          +---------+---------------+---------+-----------+----------+--------------+ PTV      None           No       No                   Acute          +---------+---------------+---------+-----------+----------+--------------+ PERO     None           No       No                   Acute          +---------+---------------+---------+-----------+----------+--------------+ Gastroc  None           No       No                   Acute          +---------+---------------+---------+-----------+----------+--------------+     Summary: RIGHT: - No evidence of common femoral vein obstruction.  LEFT: - Findings consistent with acute deep vein thrombosis involving the left common femoral vein, SF junction, left femoral vein, left proximal profunda vein, left popliteal vein, left posterior tibial veins, left peroneal veins, and left gastrocnemius veins. - No cystic structure found in the popliteal fossa. - Possible obstruction proximal to the inguinal ligament.  *See table(s) above for measurements and observations. Electronically signed by Orlie Pollen on 08/03/2021 at 7:09:29 PM.    Final    DG Hip Unilat W or Wo Pelvis 2-3 Views Left  Result Date: 08/03/2021 CLINICAL DATA:  Left hip pain EXAM: DG HIP (WITH OR WITHOUT PELVIS) 2-3V LEFT COMPARISON:  None Available. FINDINGS: There is no evidence of hip fracture or dislocation. There is no evidence of arthropathy or other focal bone abnormality. IMPRESSION: Negative. Electronically Signed   By: Davina Poke D.O.   On: 08/03/2021 17:22    ASSESSMENT & PLAN:   31 year old Caucasian male with possible family history of VTE in a maternal cousin with  #1 Extensive left lower extremity DVT extending into the left common iliac vein all the way down to the calf veins. #2 bilateral lobar segmental and subsegmental  pulmonary emboli with moderate to large thrombus burden's Plan All of the patient's testing results labs and  hospital course was discussed in detail and confirmed with him. We discussed that he has multiple risk factors for VTE including alcohol abuse causing dehydration, active smoking of cigarettes exceeding 1 and half packs per day and relative immobility per his account after having lost his job. He has had [place] thrombectomy of his left lower extremity clot and is on anticoagulation with Eliquis at this time with significant improvement in his left lower extremity symptoms.  He is currently not having any chest pain or shortness of breath.. 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  He will need to follow-up with vascular surgery to address this and does have an appointment with vein and vascular surgery later in July 2023. -Patient is and has some family history of VTE so we will send out thrombophilia work-up to rule out any other obvious inherited or acquired thrombophilias.  #3 iron deficiency anemia --likely due to poor p.o. intake with patient's alcohol abuse.  Cannot rule out other sources of GI losses. #4 thrombocytosis likely reactive due to iron deficiency but in the context of extensive thrombosis will rule out clonal thrombocytosis. Plan Patient has received an IV iron infusion in the hospital we will set him up for additional IV iron to correct his anemia more aggressively in the context of needing ongoing anticoagulation. Testing to rule out clonal thrombocytosis    Orders Placed This Encounter  Procedures   CBC with Differential ([place] Only)    Standing Status:   Future    Number of Occurrences:   1    Standing Expiration Date:   08/27/2022   CMP ([facility] only)    Standing Status:   Future    Number of Occurrences:   1    Standing Expiration Date:   08/27/2022   Antithrombin III    Standing Status:   Future    Number of Occurrences:   1     Standing Expiration Date:   08/27/2022   Protein C activity    Standing Status:   Future    Number of Occurrences:   1    Standing Expiration Date:   08/27/2022   Protein C, total    Standing Status:   Future    Number of Occurrences:   1    Standing Expiration Date:   08/27/2022   Protein S activity    Standing Status:   Future    Number of Occurrences:   1    Standing Expiration Date:   08/27/2022   Protein S, total    Standing Status:   Future    Number of Occurrences:   1    Standing Expiration Date:   08/27/2022   Lupus anticoagulant panel    Standing Status:   Future    Number of Occurrences:   1    Standing Expiration Date:   08/27/2022   Beta-2-glycoprotein i abs, IgG/M/A    Standing Status:   Future    Number of Occurrences:   1    Standing Expiration Date:   08/27/2022   Homocysteine, serum    Standing Status:   Future    Number of Occurrences:   1    Standing Expiration Date:   08/27/2022   Factor 5 leiden    Standing Status:   Future    Number of Occurrences:   1    Standing Expiration Date:   08/27/2022   Prothrombin gene mutation    Standing Status:   Future  Number of Occurrences:   1    Standing Expiration Date:   08/27/2022   Cardiolipin antibodies, IgG, IgM, IgA    Standing Status:   Future    Number of Occurrences:   1    Standing Expiration Date:   08/27/2022   JAK2 (including V617F and Exon 12), MPL, and CALR-Next Generation Sequencing    Standing Status:   Future    Number of Occurrences:   1    Standing Expiration Date:   08/27/2022   Follow-up Labs today IV Venofer 300 mg weekly x2 doses Phone visit with Dr. Irene Limbo in about 3 weeks  The total time spent in the appointment was 60 minutes*.  All of the patient's questions were answered with apparent satisfaction. The patient knows to call the clinic with any problems, questions or concerns.   Sullivan Lone MD MS AAHIVMS [facility] Hematology/Oncology Physician [facility]  .*Total  Encounter Time as defined by the Centers for Medicare and Medicaid Services includes, in addition to the face-to-face time of a patient visit (documented in the note above) non-face-to-face time: obtaining and reviewing outside history, ordering and reviewing medications, tests or procedures, care coordination (communications with other health care professionals or caregivers) and documentation in the medical record.   08/26/2021 11:51 AM

## 2021-08-26 NOTE — Progress Notes (Signed)
Provider: Melton Walls FNP-C   Enid Skeens., MD  Patient Care Team: Enid Skeens., MD as PCP - General [facility] Medicine)  Extended Emergency Contact Information Primary Emergency Contact: [facility] Address: [address]          [place], [place] [address] Johnnette Litter of [place] Phone: [phone] Mobile Phone: [phone] Relation: Grandmother  Code Status:  Full Code  Goals of care: Advanced Directive information    08/26/2021   10:13 AM  Advanced Directives  Does Patient Have a Medical Advance Directive? No  Would patient like information on creating a medical advance directive? No - Patient declined     Chief Complaint  Patient presents with   Establish Care    New Patient.     HPI:  Pt is a 31 y.o. male seen today establish care here at [facility] for medical management of chronic diseases  DVT - left leg status post hospital admission from 08/03/2021 to 08/06/2021 for extensive left leg swelling was found to have DVT and a PE echocardiogram showed EF 55 to 60% He underwent thrombectomy on 6 7 and PE was noted to be small and he was not symptomatic managed with IV heparin.  Vascular surgery was consulted and was discharged on Eliquis.Has follow up appointment with Oncologist   Anemia -reported hemorrhoids with occasional blood when he wipes after BM.  Also had low vitamin B12 levels therefore absorption issues with suspected and GI was consulted for possible EGD before starting on anticoagulant however it patient preferred to follow-up on outpatient. Vitamin B 12 def -parenteral B12 supplementation.Recommended to follow-up with PCP for monthly B12 injection he is aware that oral B12 supplementation may not work but he wants to try that.  Is advised to recheck B12 levels in 6 to 8 weeks  Colonic polyp noted seen on colonoscopy was advised to repeat colonoscopy in 7 yrs.   Does not Exercise   Eats three meals a day and a snack    Smokes 7-10 cigarettes per day has cut down from one pack per day   Marijuana smokes   Alcohol - 19 -40 oz of beer every other days  ADHD - took adderalll and retilin when growing up   Tdap vaccine - been over 10 yrs    Past Medical History:  Diagnosis Date   ADHD (attention deficit hyperactivity disorder)    Alcohol abuse    Alcohol withdrawal syndrome without complication ([place])    Alcoholic ketoacidosis 5/0/5397   Anxiety    Asthma    Depression    suicidal in past   History of adenomatous polyp of colon 01/04/2019   Right clavicle fracture 11/2018   Past Surgical History:  Procedure Laterality Date   HERNIA REPAIR     Performed when 6 months old   INTRAVASCULAR ULTRASOUND/IVUS N/A 08/05/2021   Procedure: Intravascular Ultrasound/IVUS;  Surgeon: Broadus John, MD;  Location: [place] [facility];  Service: Cardiovascular;  Laterality: N/A;   IVC VENOGRAPHY Left 08/05/2021   Procedure: IVC Venography;  Surgeon: Broadus John, MD;  Location: [place] [facility];  Service: Cardiovascular;  Laterality: Left;   LOWER EXTREMITY VENOGRAPHY Left 08/05/2021   Procedure: LOWER EXTREMITY VENOGRAPHY;  Surgeon: Broadus John, MD;  Location: [place] [facility];  Service: Cardiovascular;  Laterality: Left;   PERIPHERAL VASCULAR THROMBECTOMY Left 08/05/2021   Procedure: PERIPHERAL VASCULAR THROMBECTOMY;  Surgeon: Broadus John, MD;  Location: [place] [facility];  Service: Cardiovascular;  Laterality: Left;    Allergies  Allergen Reactions   Tape Other (See Comments)    Medical tapes irritates the patient's skin   Tea Nausea And Vomiting    Allergies as of 08/26/2021       Reactions   Tape Other (See Comments)   Medical tapes irritates the patient's skin   Tea Nausea And Vomiting        Medication List        Accurate as of August 26, 2021 10:47 AM. If you have any questions, ask your nurse or doctor.          STOP taking these medications    acetaminophen 500 MG  tablet Commonly known as: TYLENOL Stopped by: Sandrea Hughs, NP   albuterol 108 (90 Base) MCG/ACT inhaler Commonly known as: VENTOLIN HFA Stopped by: Sandrea Hughs, NP   hydrocortisone 25 MG suppository Commonly known as: ANUSOL-HC Stopped by: Sandrea Hughs, NP       TAKE these medications    apixaban 5 MG Tabs tablet Commonly known as: ELIQUIS Take 2 tablets (10 mg) by mouth twice daily for 7 days, then switch to 1 tablet (5 mg) by mouth twice daily   ferrous sulfate 325 (65 FE) MG tablet Take 1 tablet (325 mg total) by mouth 2 (two) times daily.   pantoprazole 40 MG tablet Commonly known as: PROTONIX Take 1 tablet (40 mg total) by mouth daily.   thiamine 100 MG tablet Take 1 tablet (100 mg total) by mouth daily.   vitamin B-12 1000 MCG tablet Commonly known as: CYANOCOBALAMIN Take 1 tablet (1,000 mcg total) by mouth daily.        Review of Systems  Constitutional:  Negative for appetite change, chills, fatigue, fever and unexpected weight change.  HENT:  Negative for congestion, dental problem, ear discharge, ear pain, facial swelling, hearing loss, nosebleeds, postnasal drip, rhinorrhea, sinus pressure, sinus pain, sneezing, sore throat, tinnitus and trouble swallowing.   Eyes:  Negative for pain, discharge, redness, itching and visual disturbance.  Respiratory:  Negative for cough, chest tightness, shortness of breath and wheezing.   Cardiovascular:  Negative for chest pain, palpitations and leg swelling.  Gastrointestinal:  Negative for abdominal distention, abdominal pain, blood in stool, constipation, diarrhea, nausea and vomiting.  Endocrine: Negative for cold intolerance, heat intolerance, polydipsia, polyphagia and polyuria.  Genitourinary:  Negative for difficulty urinating, dysuria, flank pain, frequency and urgency.  Musculoskeletal:  Negative for arthralgias, back pain, gait problem, joint swelling, myalgias, neck pain and neck stiffness.  Skin:   Negative for color change, pallor, rash and wound.  Neurological:  Negative for dizziness, syncope, speech difficulty, weakness, light-headedness, numbness and headaches.  Hematological:  Does not bruise/bleed easily.  Psychiatric/Behavioral:  Negative for agitation, behavioral problems, confusion, hallucinations, self-injury, sleep disturbance and suicidal ideas. The patient is not nervous/anxious.      There is no immunization history on file for this patient. Pertinent  Health Maintenance Due  Topic Date Due   INFLUENZA VACCINE  09/29/2021   COLONOSCOPY (Pts 45-56yr Insurance coverage will need to be confirmed)  02/15/2026      08/05/2021    4:03 PM 08/05/2021    7:39 PM 08/06/2021    7:45 PM 08/07/2021    8:19 AM 08/26/2021   10:13 AM  Fall Risk  Falls in the past year?     0  Was there an injury with Fall?     0  Fall Risk  Category Calculator     0  Fall Risk Category     Low  Patient Fall Risk Level Moderate fall risk Moderate fall risk Moderate fall risk Moderate fall risk Low fall risk  Patient at Risk for Falls Due to     No Fall Risks  Fall risk Follow up     Falls evaluation completed   Functional Status Survey:    Vitals:   08/26/21 1008  BP: 120/70  Pulse: 84  Resp: 16  Temp: 98.2 F (36.8 C)  SpO2: 97%  Weight: 126 lb 9.6 oz (57.4 kg)  Height: '5\' 11"'$  (1.803 m)   Body mass index is 17.66 kg/m. Physical Exam Vitals reviewed.  Constitutional:      General: He is not in acute distress.    Appearance: Normal appearance. He is overweight. He is not ill-appearing or diaphoretic.  HENT:     Head: Normocephalic.     Right Ear: Tympanic membrane, ear canal and external ear normal. There is no impacted cerumen.     Left Ear: Tympanic membrane, ear canal and external ear normal. There is no impacted cerumen.     Nose: Nose normal. No congestion or rhinorrhea.     Mouth/Throat:     Mouth: Mucous membranes are moist.     Pharynx: Oropharynx is clear. No oropharyngeal  exudate or posterior oropharyngeal erythema.  Eyes:     General: No scleral icterus.       Right eye: No discharge.        Left eye: No discharge.     Extraocular Movements: Extraocular movements intact.     Conjunctiva/sclera: Conjunctivae normal.     Pupils: Pupils are equal, round, and reactive to light.  Neck:     Vascular: No carotid bruit.  Cardiovascular:     Rate and Rhythm: Normal rate and regular rhythm.     Pulses: Normal pulses.     Heart sounds: Normal heart sounds. No murmur heard.    No friction rub. No gallop.  Pulmonary:     Effort: Pulmonary effort is normal. No respiratory distress.     Breath sounds: Normal breath sounds. No wheezing, rhonchi or rales.  Chest:     Chest wall: No tenderness.  Abdominal:     General: Bowel sounds are normal. There is no distension.     Palpations: Abdomen is soft. There is no mass.     Tenderness: There is no abdominal tenderness. There is no right CVA tenderness, left CVA tenderness, guarding or rebound.  Musculoskeletal:        General: No swelling or tenderness. Normal range of motion.     Cervical back: Normal range of motion. No rigidity or tenderness.     Right lower leg: No edema.     Left lower leg: No edema.  Lymphadenopathy:     Cervical: No cervical adenopathy.  Skin:    General: Skin is warm and dry.     Coloration: Skin is not pale.     Findings: No bruising, erythema, lesion or rash.  Neurological:     Mental Status: He is alert and oriented to person, place, and time.     Cranial Nerves: No cranial nerve deficit.     Sensory: No sensory deficit.     Motor: No weakness.     Coordination: Coordination normal.     Gait: Gait normal.  Psychiatric:        Mood and Affect: Mood normal.  Speech: Speech normal.        Behavior: Behavior normal.        Thought Content: Thought content normal.        Judgment: Judgment normal.     Labs reviewed: Recent Labs    02/08/21 1718 08/03/21 1659  08/04/21 0430 08/07/21 0322  NA 141 132* 134* 135  K 3.7 3.8 3.7 3.7  CL 107 100 105 101  CO2 20* 20* 22 24  GLUCOSE 104* 105* 105* 103*  BUN '12 8 7 '$ <5*  CREATININE 0.85 0.69 0.67 0.54*  CALCIUM 9.3 8.9 8.3* 8.6*  MG 1.6*  --  2.0  --   PHOS  --   --  4.1  --    Recent Labs    02/08/21 1718 08/03/21 1659 08/04/21 0430  AST 34 14* 11*  ALT '30 10 7  '$ ALKPHOS 56 67 50  BILITOT 0.7 0.8 0.6  PROT 7.6 7.6 6.3*  ALBUMIN 4.3 3.4* 2.8*   Recent Labs    08/03/21 1659 08/04/21 0430 08/05/21 0510 08/06/21 0054 08/06/21 0825 08/07/21 0322 08/14/21 1247  WBC 14.6* 12.3*   < > 13.3*  --  9.8 10.8*  NEUTROABS 11.6* 8.7*  --   --   --   --  6.2  HGB 8.6* 7.2*   < > 6.6* 8.0* 8.2* 10.5*  HCT 31.8* 25.6*   < > 23.4* 27.0* 27.8* 34.7*  MCV 71.6* 69.6*   < > 69.4*  --  69.5* 70.8*  PLT 448* 356   < > 427*  --  454* 766.0*   < > = values in this interval not displayed.   Lab Results  Component Value Date   TSH 4.975 (H) 09/19/2017   Lab Results  Component Value Date   HGBA1C 5.5 09/18/2017   Lab Results  Component Value Date   CHOL 173 09/18/2017   HDL 126 09/18/2017   LDLCALC 43 09/18/2017   TRIG 21 09/18/2017   CHOLHDL 1.4 09/18/2017    Significant Diagnostic Results in last 30 days:  PERIPHERAL VASCULAR CATHETERIZATION  Result Date: 08/05/2021 Images from the original result were not included. Patient name: Tejas Seawood MRN: [id number] DOB: Aug 04, 1990 Sex: male 08/05/2021 Pre-operative Diagnosis: Left lower extremity iliofemoral deep venous thrombosis Post-operative diagnosis:  Same Surgeon:  Broadus John, MD Procedure Performed: 1.  Ultrasound-guided micropuncture access of the left popliteal vein 2.  Left lower extremity venogram 3.  IlioCaval venogram 4.  Percutaneous mechanical thrombectomy using Inari clot Treiver 5.  Intravascular ultrasound 6.  Access managed with Monocryl suture 7.  Moderate sedation time 45 minutes 8.  Contrast 50 mL Indications: This is a 31 y.o.  male with extensive left lower extremity acute DVT extending from the common iliac vein through the tibial veins.  He does not have phlegmasia.  He is having significant pain.  No sensorimotor deficits.  I had a long conversation with Gareth and his mother regarding percutaneous thrombectomy in an effort to relieve the thrombotic burden and improve pain symptoms. Findings: Thrombus extending from the left popliteal vein access site through the left common iliac vein.  No caval thrombus appreciated  Procedure: Patient was brought to the OR laid prone.  The patient was prepped and draped in standard fashion and a timeout was performed. I obtained access in the left popliteal vein using an ultrasound-guided micropuncture needle. From this access, a wire was run into the inferior vena cava.  An 8 French sheath was placed into  the popliteal fossa and left leg venogram, cavogram followed.  This demonstrated thrombus throughout the left leg and left iliac system.  The cava demonstrated no thrombus.  An Amplatz wire was parked in the left subclavian vein, and the iliac confluence was marked using intravascular ultrasound.  Next, the Inari clot Isaias Sakai was brought onto the field.  The patient was heparinized. The 16 French sheath was placed in the popliteal fossa and the clot Donnella Bi was deployed in standard fashion with multiple passes from the inferior vena cava through the popliteal vein in the 12 o'clock position, 3 o'clock position, 6 o'clock position, 9 o'clock position.  There was significant thrombotic return.  The last pass had very little thrombotic burden appreciated in the basket. Follow-up venogram demonstrated excellent result with excellent flow through the left femoral and iliac system.  IVUS was brought onto the field to assess for iliac vein compression.  There was a moderate amount of compression appreciated, however I elected not to stent the lesion due to the patient's comorbidities including alcohol  abuse, drug abuse, and concern for medication noncompliance. I had a long discussion with him regarding the above.  Should he re-thrombose, he would require stenting.  My working diagnosis is provoked DVT from immobility over the last week.  He will need hematology follow-up for hypercoagulable work-up as well. Cassandria Santee, MD [facility] Office: [phone]   CARDIAC CATHETERIZATION  Result Date: 08/05/2021 Images from the original result were not included. Patient name: Magnum Lunde MRN: [id number] DOB: 1990/09/14 Sex: male 08/05/2021 Pre-operative Diagnosis: Left lower extremity iliofemoral deep venous thrombosis Post-operative diagnosis:  Same Surgeon:  Broadus John, MD Procedure Performed: 1.  Ultrasound-guided micropuncture access of the left popliteal vein 2.  Left lower extremity venogram 3.  IlioCaval venogram 4.  Percutaneous mechanical thrombectomy using Inari clot Treiver 5.  Intravascular ultrasound 6.  Access managed with Monocryl suture 7.  Moderate sedation time 45 minutes 8.  Contrast 50 mL Indications: This is a 32 y.o. male with extensive left lower extremity acute DVT extending from the common iliac vein through the tibial veins.  He does not have phlegmasia.  He is having significant pain.  No sensorimotor deficits.  I had a long conversation with Arkel and his mother regarding percutaneous thrombectomy in an effort to relieve the thrombotic burden and improve pain symptoms. Findings: Thrombus extending from the left popliteal vein access site through the left common iliac vein.  No caval thrombus appreciated  Procedure: Patient was brought to the OR laid prone.  The patient was prepped and draped in standard fashion and a timeout was performed. I obtained access in the left popliteal vein using an ultrasound-guided micropuncture needle. From this access, a wire was run into the inferior vena cava.  An 8 French sheath was placed into the popliteal fossa  and left leg venogram, cavogram followed.  This demonstrated thrombus throughout the left leg and left iliac system.  The cava demonstrated no thrombus.  An Amplatz wire was parked in the left subclavian vein, and the iliac confluence was marked using intravascular ultrasound.  Next, the Inari clot Isaias Sakai was brought onto the field.  The patient was heparinized. The 16 French sheath was placed in the popliteal fossa and the clot Donnella Bi was deployed in standard fashion with multiple passes from the inferior vena cava through the popliteal vein in the 12 o'clock position, 3 o'clock position, 6 o'clock position, 9 o'clock position.  There was  significant thrombotic return.  The last pass had very little thrombotic burden appreciated in the basket. Follow-up venogram demonstrated excellent result with excellent flow through the left femoral and iliac system.  IVUS was brought onto the field to assess for iliac vein compression.  There was a moderate amount of compression appreciated, however I elected not to stent the lesion due to the patient's comorbidities including alcohol abuse, drug abuse, and concern for medication noncompliance. I had a long discussion with him regarding the above.  Should he re-thrombose, he would require stenting.  My working diagnosis is provoked DVT from immobility over the last week.  He will need hematology follow-up for hypercoagulable work-up as well. Cassandria Santee, MD [facility] Office: [phone]   ECHOCARDIOGRAM COMPLETE  Result Date: 08/04/2021    ECHOCARDIOGRAM REPORT   Patient Name:   AMIN FORNWALT Kitamura Date of Exam: 08/04/2021 Medical Rec #:  [id number]        Height:       71.0 in Accession #:    [id number]       Weight:       135.0 lb Date of Birth:  06-09-1990         BSA:          1.784 m Patient Age:    31 years         BP:           109/86 mmHg Patient Gender: M                HR:           101 bpm. Exam Location:  Inpatient Procedure: 2D  Echo, Cardiac Doppler, Color Doppler and Strain Analysis Indications:    Pulmonary embolus  History:        Patient has no prior history of Echocardiogram examinations.  Sonographer:    Pelican Referring Phys: [id number] [place]  1. Left ventricular ejection fraction, by estimation, is 55 to 60%. The left ventricle has normal function. The left ventricle has no regional wall motion abnormalities. Indeterminate diastolic filling due to E-A fusion.  2. Right ventricular systolic function is normal. The right ventricular size is normal. Tricuspid regurgitation signal is inadequate for assessing PA pressure.  3. The mitral valve is grossly normal. No evidence of mitral valve regurgitation. No evidence of mitral stenosis.  4. The aortic valve is tricuspid. Aortic valve regurgitation is not visualized. No aortic stenosis is present.  5. The inferior vena cava is normal in size with greater than 50% respiratory variability, suggesting right atrial pressure of 3 mmHg. Conclusion(s)/Recommendation(s): Normal biventricular function without evidence of hemodynamically significant valvular heart disease. FINDINGS  Left Ventricle: Left ventricular ejection fraction, by estimation, is 55 to 60%. The left ventricle has normal function. The left ventricle has no regional wall motion abnormalities. Global longitudinal strain performed but not reported based on interpreter judgement due to suboptimal tracking. The left ventricular internal cavity size was normal in size. There is no left ventricular hypertrophy. Indeterminate diastolic filling due to E-A fusion. Right Ventricle: The right ventricular size is normal. No increase in right ventricular wall thickness. Right ventricular systolic function is normal. Tricuspid regurgitation signal is inadequate for assessing PA pressure. Left Atrium: Left atrial size was normal in size. Right Atrium: Right atrial size was normal in size. Pericardium: There is no  evidence of pericardial effusion. Mitral Valve: The mitral valve is grossly normal. No evidence of mitral  valve regurgitation. No evidence of mitral valve stenosis. MV peak gradient, 4.2 mmHg. The mean mitral valve gradient is 3.0 mmHg. Tricuspid Valve: The tricuspid valve is grossly normal. Tricuspid valve regurgitation is not demonstrated. No evidence of tricuspid stenosis. Aortic Valve: The aortic valve is tricuspid. Aortic valve regurgitation is not visualized. No aortic stenosis is present. Aortic valve mean gradient measures 3.0 mmHg. Aortic valve peak gradient measures 6.6 mmHg. Aortic valve area, by VTI measures 4.11 cm. Pulmonic Valve: The pulmonic valve was grossly normal. Pulmonic valve regurgitation is not visualized. No evidence of pulmonic stenosis. Aorta: The aortic root and ascending aorta are structurally normal, with no evidence of dilitation. Venous: The inferior vena cava is normal in size with greater than 50% respiratory variability, suggesting right atrial pressure of 3 mmHg. IAS/Shunts: The atrial septum is grossly normal.  LEFT VENTRICLE PLAX 2D LVIDd:         4.90 cm     Diastology LVIDs:         3.20 cm     LV e' medial:    11.10 cm/s LV PW:         1.00 cm     LV E/e' medial:  8.3 LV IVS:        0.90 cm     LV e' lateral:   14.00 cm/s LVOT diam:     2.50 cm     LV E/e' lateral: 6.6 LV SV:         79 LV SV Index:   44 LVOT Area:     4.91 cm  LV Volumes (MOD) LV vol d, MOD A2C: 91.1 ml LV vol d, MOD A4C: 83.1 ml LV vol s, MOD A2C: 48.4 ml LV vol s, MOD A4C: 32.4 ml LV SV MOD A2C:     42.7 ml LV SV MOD A4C:     83.1 ml LV SV MOD BP:      46.6 ml RIGHT VENTRICLE RV Basal diam:  4.00 cm RV Mid diam:    3.20 cm LEFT ATRIUM           Index        RIGHT ATRIUM          Index LA Vol (A4C): 18.5 ml 10.37 ml/m  RA Area:     6.96 cm                                    RA Volume:   10.90 ml 6.11 ml/m  AORTIC VALVE                    PULMONIC VALVE AV Area (Vmax):    4.26 cm     PV Vmax:        1.18 m/s AV Area (Vmean):   4.16 cm     PV Vmean:      84.700 cm/s AV Area (VTI):     4.11 cm     PV VTI:        0.203 m AV Vmax:           128.00 cm/s  PV Peak grad:  5.6 mmHg AV Vmean:          84.500 cm/s  PV Mean grad:  3.0 mmHg AV VTI:            0.192 m AV Peak Grad:      6.6 mmHg AV Mean  Grad:      3.0 mmHg LVOT Vmax:         111.00 cm/s LVOT Vmean:        71.600 cm/s LVOT VTI:          0.160 m LVOT/AV VTI ratio: 0.84  AORTA Ao Root diam: 3.10 cm Ao Asc diam:  3.20 cm MITRAL VALVE MV Area (PHT): 4.63 cm    SHUNTS MV Area VTI:   3.63 cm    Systemic VTI:  0.16 m MV Peak grad:  4.2 mmHg    Systemic Diam: 2.50 cm MV Mean grad:  3.0 mmHg MV Vmax:       1.02 m/s MV Vmean:      75.9 cm/s MV Decel Time: 164 msec MV E velocity: 91.80 cm/s MV A velocity: 85.70 cm/s MV E/A ratio:  1.07 Eleonore Chiquito MD Electronically signed by Eleonore Chiquito MD Signature Date/Time: 08/04/2021/12:13:16 PM    Final    CT VENOGRAM ABD/PEL  Result Date: 08/03/2021 CLINICAL DATA:  Follow-up extensive DVT seen on ultrasound imaging. EXAM: CT VENOGRAM ABDOMEN AND PELVIS TECHNIQUE: Multidetector CT imaging of the abdomen and pelvis was performed using the standard protocol following bolus administration of intravenous contrast. RADIATION DOSE REDUCTION: This exam was performed according to the departmental dose-optimization program which includes automated exposure control, adjustment of the mA and/or kV according to patient size and/or use of iterative reconstruction technique. CONTRAST:  60m OMNIPAQUE IOHEXOL 350 MG/ML SOLN COMPARISON:  November 16, 2016 FINDINGS: Lower chest: A moderate amount of intraluminal low attenuation is seen within the lower lobe branches of the right pulmonary artery. Hepatobiliary: No focal liver abnormality is seen. No gallstones, gallbladder wall thickening, or biliary dilatation. Pancreas: Unremarkable. No pancreatic ductal dilatation or surrounding inflammatory changes. Spleen: Normal in size without  focal abnormality. Adrenals/Urinary Tract: Adrenal glands are unremarkable. Kidneys are normal, without renal calculi, focal lesion, or hydronephrosis. Bladder is unremarkable. Stomach/Bowel: Stomach is within normal limits. Appendix appears normal. No evidence of bowel wall thickening, distention, or inflammatory changes. Vascular/Lymphatic: The arterial structures within the abdomen pelvis are unremarkable. An extensive amount of intraluminal low attenuation is seen within the left common iliac vein. This originates from its origin and extends throughout the left external iliac vein and left internal iliac vein, with progression to involve the visualized portion of the left common femoral vein and deep left femoral vein. Associated perivascular inflammatory fat stranding is seen. No enlarged abdominal or pelvic lymph nodes. Reproductive: Prostate is unremarkable. Other: No abdominal wall hernia or abnormality. No abdominopelvic ascites. Musculoskeletal: No acute or significant osseous findings. IMPRESSION: 1. Moderate amount of pulmonary embolism within the lower lobe branches of the RIGHT pulmonary artery. 2. Extensive amount of occlusive thrombus within the LEFT common iliac vein, LEFT external iliac vein and LEFT internal iliac vein, with progression to involve the visualized portion of the LEFT common femoral vein and deep left femoral vein. Electronically Signed   By: TVirgina NorfolkM.D.   On: 08/03/2021 22:20   CT Angio Chest PE W and/or Wo Contrast  Addendum Date: 08/03/2021   ADDENDUM REPORT: 08/03/2021 21:26 ADDENDUM: Critical findings were reported to Dr. LRayna Sextonat 9:25 p.m. Electronically Signed   By: LBrett FairyM.D.   On: 08/03/2021 21:26   Result Date: 08/03/2021 CLINICAL DATA:  Pulmonary embolism suspected, high probability. Tachycardia. EXAM: CT ANGIOGRAPHY CHEST WITH CONTRAST TECHNIQUE: Multidetector CT imaging of the chest was performed using the standard protocol during bolus  administration of intravenous  contrast. Multiplanar CT image reconstructions and MIPs were obtained to evaluate the vascular anatomy. RADIATION DOSE REDUCTION: This exam was performed according to the departmental dose-optimization program which includes automated exposure control, adjustment of the mA and/or kV according to patient size and/or use of iterative reconstruction technique. CONTRAST:  54m OMNIPAQUE IOHEXOL 350 MG/ML SOLN COMPARISON:  None Available. FINDINGS: Cardiovascular: Heart is normal in size and there is no pericardial effusion. The aorta and pulmonary trunk are normal in caliber. There are lobar, segmental, and subsegmental pulmonary artery filling defects bilaterally with moderate to large emboli burden. No evidence of right heart strain. Mediastinum/Nodes: No enlarged mediastinal, hilar, or axillary lymph nodes. Thyroid gland, trachea, and esophagus demonstrate no significant findings. Lungs/Pleura: Minimal apical pleural scarring is noted bilaterally. No consolidation, effusion, or pneumothorax. Upper Abdomen: No acute abnormality. Musculoskeletal: No acute osseous abnormality. Review of the MIP images confirms the above findings. IMPRESSION: 1. Bilateral lobar, segmental, and subsegmental pulmonary emboli with moderate-to-large thrombus burden. No evidence of right heart strain. 2. No acute infiltrate in the lungs. Electronically Signed: By: LBrett FairyM.D. On: 08/03/2021 21:21   VAS [place]IVC/ILIAC (VENOUS ONLY)  Result Date: 08/03/2021 IVC/ILIAC STUDY Patient Name:  JJOAL EAKLE Date of Exam:   08/03/2021 Medical Rec #: [id number]        Accession #:    [id number] of Birth: [birth year]         Patient Gender: Patient Age:   89years [facility]      [facility] [place]IVC/ILIAC (VENOUS ONLY) Referring Phys: --------------------------------------------------------------------------------  Indications: Extensive left lower extremity DVT with extension above  inguinal              ligament Limitations: Air/bowel gas.  Comparison Study: 08-03-2021 Left lower extremity venous study. Performing Technologist: RDarlin CocoRDMS, RVT  Examination Guidelines: A complete evaluation includes B-mode imaging, spectral Doppler, color Doppler, and power Doppler as needed of all accessible portions of each vessel. Bilateral testing is considered an integral part of a complete examination. Limited examinations for reoccurring indications may be performed as noted.  IVC/Iliac Findings: +----------+------+--------+--------+    IVC    PatentThrombusComments +----------+------+--------+--------+ IVC Mid   patent                 +----------+------+--------+--------+ IVC Distalpatent                 +----------+------+--------+--------+  +-------------------+---------+-----------+---------+-----------+--------+         CIV        RT-PatentRT-ThrombusLT-PatentLT-ThrombusComments +-------------------+---------+-----------+---------+-----------+--------+ Common Iliac Prox   patent                         acute            +-------------------+---------+-----------+---------+-----------+--------+ Common Iliac Mid    patent                         acute            +-------------------+---------+-----------+---------+-----------+--------+ Common Iliac Distal patent                         acute            +-------------------+---------+-----------+---------+-----------+--------+  +-------------------------+---------+-----------+---------+-----------+--------+            EIV           RT-PatentRT-ThrombusLT-PatentLT-ThrombusComments +-------------------------+---------+-----------+---------+-----------+--------+ External Iliac Vein Prox  patent  acute            +-------------------------+---------+-----------+---------+-----------+--------+ External Iliac Vein Mid   patent                         acute             +-------------------------+---------+-----------+---------+-----------+--------+ External Iliac Vein       patent                         acute            Distal                                                                    +-------------------------+---------+-----------+---------+-----------+--------+   Summary: IVC/Iliac: There is no evidence of thrombus involving the IVC. There is no evidence of thrombus involving the right common iliac vein. There is evidence of acute thrombus involving the left common iliac vein. There is no evidence of thrombus involving the right external iliac vein. There is evidence of acute thrombus involving the left external iliac vein. Visualization of proximal Inferior Vena Cava was limited.  *See table(s) above for measurements and observations.  Electronically signed by Orlie Pollen on 08/03/2021 at 7:09:53 PM.    Final    VAS [place] LOWER EXTREMITY VENOUS (DVT) (ONLY [facility] & [facility])  Result Date: 08/03/2021  Lower Venous DVT Study Patient Name:  DASCHEL ROUGHTON  Date of Exam:   08/03/2021 Medical Rec #: [id number]         Accession #:    [id number] Date of Birth: [birth year]          Patient Gender: M Patient Age:   24 years Exam Location:  [facility] Procedure:      VAS [place] LOWER EXTREMITY VENOUS (DVT) Referring Phys: LESLIE SOFIA --------------------------------------------------------------------------------  Indications: Pain left leg.  Comparison Study: No prior studies. Performing Technologist: Darlin Coco RDMS, RVT  Examination Guidelines: A complete evaluation includes B-mode imaging, spectral Doppler, color Doppler, and power Doppler as needed of all accessible portions of each vessel. Bilateral testing is considered an integral part of a complete examination. Limited examinations for reoccurring indications may be performed as noted. The reflux portion of the exam is performed with the patient in reverse Trendelenburg.   +-----+---------------+---------+-----------+----------+--------------+ RIGHTCompressibilityPhasicitySpontaneityPropertiesThrombus Aging +-----+---------------+---------+-----------+----------+--------------+ CFV  Full           Yes      Yes                                 +-----+---------------+---------+-----------+----------+--------------+   +---------+---------------+---------+-----------+----------+--------------+ LEFT     CompressibilityPhasicitySpontaneityPropertiesThrombus Aging +---------+---------------+---------+-----------+----------+--------------+ CFV      None           No       No                   Acute          +---------+---------------+---------+-----------+----------+--------------+ SFJ      None           No       No  Acute          +---------+---------------+---------+-----------+----------+--------------+ FV Prox  None           No       No                   Acute          +---------+---------------+---------+-----------+----------+--------------+ FV Mid   None           No       No                   Acute          +---------+---------------+---------+-----------+----------+--------------+ FV DistalNone           No       No                   Acute          +---------+---------------+---------+-----------+----------+--------------+ PFV      None           No       No                   Acute          +---------+---------------+---------+-----------+----------+--------------+ POP      None           No       No                   Acute          +---------+---------------+---------+-----------+----------+--------------+ PTV      None           No       No                   Acute          +---------+---------------+---------+-----------+----------+--------------+ PERO     None           No       No                   Acute           +---------+---------------+---------+-----------+----------+--------------+ Gastroc  None           No       No                   Acute          +---------+---------------+---------+-----------+----------+--------------+     Summary: RIGHT: - No evidence of common femoral vein obstruction.  LEFT: - Findings consistent with acute deep vein thrombosis involving the left common femoral vein, SF junction, left femoral vein, left proximal profunda vein, left popliteal vein, left posterior tibial veins, left peroneal veins, and left gastrocnemius veins. - No cystic structure found in the popliteal fossa. - Possible obstruction proximal to the inguinal ligament.  *See table(s) above for measurements and observations. Electronically signed by Orlie Pollen on 08/03/2021 at 7:09:29 PM.    Final    DG Hip Unilat W or Wo Pelvis 2-3 Views Left  Result Date: 08/03/2021 CLINICAL DATA:  Left hip pain EXAM: DG HIP (WITH OR WITHOUT PELVIS) 2-3V LEFT COMPARISON:  None Available. FINDINGS: There is no evidence of hip fracture or dislocation. There is no evidence of arthropathy or other focal bone abnormality. IMPRESSION: Negative. Electronically Signed   By: Davina Poke D.O.   On: 08/03/2021 17:22    Assessment/Plan 1. Encounter  to establish care Available records reviewed, immunization up-to-date except due for tetanus vaccine and COVID-19 vaccine.  Recommended scheduling for annual physical examination with fasting blood work  2. Need for Tdap vaccination Advised to get Tdap vaccine at the pharmacy. Script send to pharmacy today - Tdap ([place]) 5-2.5-18.5 LF-MCG/0.5 injection; Inject 0.5 mLs into the muscle once for 1 dose.  Dispense: 0.5 mL; Refill: 0  3. Vitamin B12 deficiency Vitamin B12 level 241 on recent hospital visit -Continue on over-the-counter vitamin B12 supplement -We will recheck vitamin B12 levels in 8 weeks and give monthly injection if indicated.  4. Iron deficiency anemia, unspecified  iron deficiency anemia type Low hemoglobin during hospitalization but has improved to 12.7  5. Alcohol use disorder, moderate, dependence ([place]) Alcohol use cessation advised  6. History of DVT of lower extremity S/p hospital admission due to left leg DVT with thrombectomy done and started on Eliquis -Reports no signs of bleeding -Has follow-up with oncology today  7. History of pulmonary embolus (PE) Continue on Eliquis    8. Marijuana abuse, continuous Marijuana use cessation advised  Family/ staff Communication: Reviewed plan of care with patient verbalized understanding  Labs/tests ordered: None   Next Appointment : Return in about 2 months (around 10/26/2021) for annual Physical examination.   Sandrea Hughs, NP

## 2021-08-27 LAB — HOMOCYSTEINE: Homocysteine: 15.7 umol/L — ABNORMAL HIGH (ref 0.0–14.5)

## 2021-08-27 LAB — PROTEIN S, TOTAL: Protein S Ag, Total: 144 % (ref 60–150)

## 2021-08-27 LAB — LUPUS ANTICOAGULANT PANEL
DRVVT: 33.6 s (ref 0.0–47.0)
PTT Lupus Anticoagulant: 33.8 s (ref 0.0–43.5)

## 2021-08-27 LAB — PROTEIN S ACTIVITY: Protein S Activity: 137 % (ref 63–140)

## 2021-08-27 LAB — PROTEIN C, TOTAL: Protein C, Total: 107 % (ref 60–150)

## 2021-08-27 LAB — PROTEIN C ACTIVITY: Protein C Activity: 127 % (ref 73–180)

## 2021-08-28 LAB — BETA-2-GLYCOPROTEIN I ABS, IGG/M/A
Beta-2 Glyco I IgG: <9 GPI IgG units (ref 0–20)
Beta-2-Glycoprotein I IgA: <9 GPI IgA units (ref 0–25)
Beta-2-Glycoprotein I IgM: <9 GPI IgM units (ref 0–32)

## 2021-08-28 LAB — CARDIOLIPIN ANTIBODIES, IGG, IGM, IGA
Anticardiolipin IgA: <9 APL U/mL (ref 0–11)
Anticardiolipin IgG: <9 GPL U/mL (ref 0–14)
Anticardiolipin IgM: <9 MPL U/mL (ref 0–12)

## 2021-08-31 LAB — FACTOR 5 LEIDEN

## 2021-08-31 LAB — JAK2 (INCLUDING V617F AND EXON 12), MPL,& CALR-NEXT GEN SEQ

## 2021-09-02 ENCOUNTER — Encounter: Payer: Self-pay | Admitting: Hematology

## 2021-09-02 LAB — PROTHROMBIN GENE MUTATION

## 2021-09-02 NOTE — Progress Notes (Unsigned)
09/02/2021 Greig Altergott [id number] 17-Sep-1990   Chief Complaint:  History of Present Illness: Mark Mcdaniel is a 31 y.o. male with a past medical history of asthma, anxiety, depression, ADHD, alcohol use disorder and colon polyps.   He presented to [facility] ED 08/03/2021 for further evaluation regarding left groin and upper leg pain which progressively worsened x 2 weeks.  No known injury. Labs in the ED showed a WBC count 14.6.  Hemoglobin 8.6 (Hg 11.6 on 02/08/2021).  Hematocrit 31.8.  MCV 71.6.  Platelet 448.  A doppler study identified an acute DVT to the left common femoral vein, SF junction, left femoral vein, left proximal profunda vein, left popliteal vein, left posterior tibial veins, left peroneal veins, and left gastrocnemius veins.  Chest CT showed bilateral pulmonary emboli with moderate-to-large thrombus burden. No evidence of right heart strain.  He was started on IV heparin.  He was evaluated by vascular surgery, s/p percutaneous venous thrombectomy 6/7.  Hg dropped today to 6.6 -> transfused one unit of PRBCs -> Hg 8.  No overt GI bleeding during his hospital admission. Endoscopic evaluation was deferred in the setting of acute DVT/bilateral PE on anticoagulation. He was initially on heparin IV and transitioned to Eliquis at time of discharge.   He was seen by hematologist Dr. Irene Limbo on  he also likely has May-Thurner syndrome.  He will need to follow-up with vascular surgery to address   Patient is and has some family history of VTE so we will send out thrombophilia work-up to rule out any other obvious inherited or acquired thrombophilias  thrombocytosis likely reactive due to iron deficiency but in the context of extensive thrombosis will rule out clonal thrombocytosis.  Smokes 1/2 ppd, has not completely stopped alcohol     Latest Ref Rng & Units 08/26/2021   12:34 PM 08/14/2021   12:47 PM 08/07/2021    3:22 AM  CBC  WBC 4.0 - 10.5 K/uL 9.0  10.8  9.8    Hemoglobin 13.0 - 17.0 g/dL 12.7  10.5  8.2   Hematocrit 39.0 - 52.0 % 42.7  34.7  27.8   Platelets 150 - 400 K/uL 460  766.0  454        Latest Ref Rng & Units 08/26/2021   12:34 PM 08/07/2021    3:22 AM 08/04/2021    4:30 AM  CMP  Glucose 70 - 99 mg/dL 81  103  105   BUN 6 - 20 mg/dL 12  <5  7   Creatinine 0.61 - 1.24 mg/dL 0.73  0.54  0.67   Sodium 135 - 145 mmol/L 138  135  134   Potassium 3.5 - 5.1 mmol/L 4.0  3.7  3.7   Chloride 98 - 111 mmol/L 101  101  105   CO2 22 - 32 mmol/L '28  24  22   '$ Calcium 8.9 - 10.3 mg/dL 10.0  8.6  8.3   Total Protein 6.5 - 8.1 g/dL 8.0   6.3   Total Bilirubin 0.3 - 1.2 mg/dL 0.7   0.6   Alkaline Phos 38 - 126 U/L 63   50   AST 15 - 41 U/L 14   11   ALT 0 - 44 U/L 10   7     PAST GI PROCEDURES:   Colonoscopy 02/16/2019: Deformed coccyx found on perianal exam. - Mobile coccyx found on digital rectal exam. - One 1 to 2 mm hyperplastic polyp in the  proximal sigmoid colon, removed with a cold biopsy forceps. Resected and retrieved. - Internal hemorrhoids. - Mucosal ulceration. in rectum from recent hemorrhoid liigation left lateral - The examination was otherwise normal on direct and retroflexion views. -Recall colonoscopy 7 years   Flexible sigmoidoscopy 01/04/2019: Bony prominence proximal coccyx found on perianal exam. - Mobile distal coccyx found on digital rectal exam. - External and internal hemorrhoids. Anoscopy also performed - One diminutive tubular adenomatous polyp in the sigmoid colon, removed with a cold snare. Resected and retrieved. - The examination was otherwise normal. into right colon though prep not adequate there    Current Medications, Allergies, Past Medical History, Past Surgical History, Family History and Social History were reviewed in [employer] record.   Review of Systems:   Constitutional: Negative for fever, sweats, chills or weight loss.  Respiratory: Negative for shortness of breath.    Cardiovascular: Negative for chest pain, palpitations and leg swelling.  Gastrointestinal: See HPI.  Musculoskeletal: Negative for back pain or muscle aches.  Neurological: Negative for dizziness, headaches or paresthesias.    Physical Exam: There were no vitals taken for this visit. General: Well developed, w   ***male in no acute distress. Head: Normocephalic and atraumatic. Eyes: No scleral icterus. Conjunctiva pink . Ears: Normal auditory acuity. Mouth: Dentition intact. No ulcers or lesions.  Lungs: Clear throughout to auscultation. Heart: Regular rate and rhythm, no murmur. Abdomen: Soft, nontender and nondistended. No masses or hepatomegaly. Normal bowel sounds x 4 quadrants.  Rectal: *** Musculoskeletal: Symmetrical with no gross deformities. Extremities: No edema. Neurological: Alert oriented x 4. No focal deficits.  Psychological: Alert and cooperative. Normal mood and affect  Assessment and Recommendations:  64) 31 year old male admitted to the hospital with an acute LLE DVT and PE with iron deficiency anemia. S/P percutaneous venous thrombectomy 6/7.  On Heparin infusion. Received IV iron on 6/7 and B12 injections.  Admission hemoglobin 8.6 (baseline Hg 11.6) -> 7.2 -> 7.3 -> Hg 6.6 -> transfused one unit of PRBCs -> Hg 8.0.  Iron 9.  Saturation ratios 3.  TIBC 358.  Ferritin 11. B12 level 241.  No overt GI bleeding during his hospitalization. Chronic hemorrhoidal bleeding + bone marrow suppression from alcohol use likely contributing to anemia.     2) History of alcohol use disorder.  Last alcohol intake was

## 2021-09-03 ENCOUNTER — Ambulatory Visit (INDEPENDENT_AMBULATORY_CARE_PROVIDER_SITE_OTHER): Payer: Self-pay | Admitting: Nurse Practitioner

## 2021-09-03 ENCOUNTER — Encounter: Payer: Self-pay | Admitting: Nurse Practitioner

## 2021-09-03 VITALS — BP 110/78 | HR 92 | Ht 71.0 in | Wt 128.5 lb

## 2021-09-03 DIAGNOSIS — D508 Other iron deficiency anemias: Secondary | ICD-10-CM

## 2021-09-03 NOTE — Patient Instructions (Addendum)
Continue Pantoprazole.   Continue Iron supplement.   Apply a small amount of Desitin inside the anal opening and to the external anal area tid as needed for anal or hemorrhoidal irritation/bleeding.   Your provider has requested that you go to the basement level for lab work on 10/04/21. Press "B" on the elevator. The lab is located at the first door on the left as you exit the elevator.  NO ALCOHOL   If you are age 30 or older, your body mass index should be between 23-30. Your Body mass index is 17.92 kg/m. If this is out of the aforementioned range listed, please consider follow up with your Primary Care Provider.  If you are age 29 or younger, your body mass index should be between 19-25. Your Body mass index is 17.92 kg/m. If this is out of the aformentioned range listed, please consider follow up with your Primary Care Provider.   ________________________________________________________  The [facility] providers would like to encourage you to use [facility] to communicate with providers for non-urgent requests or questions.  Due to long hold times on the telephone, sending your provider a message by [facility] may be a faster and more efficient way to get a response.  Please allow 48 business hours for a response.  Please remember that this is for non-urgent requests.  _______________________________________________________

## 2021-09-07 ENCOUNTER — Other Ambulatory Visit: Payer: Self-pay

## 2021-09-07 ENCOUNTER — Inpatient Hospital Stay: Payer: Self-pay | Attending: Hematology

## 2021-09-07 VITALS — BP 118/73 | HR 73 | Temp 98.3°F | Resp 16

## 2021-09-07 DIAGNOSIS — Z7901 Long term (current) use of anticoagulants: Secondary | ICD-10-CM | POA: Insufficient documentation

## 2021-09-07 DIAGNOSIS — I82422 Acute embolism and thrombosis of left iliac vein: Secondary | ICD-10-CM | POA: Insufficient documentation

## 2021-09-07 DIAGNOSIS — Z803 Family history of malignant neoplasm of breast: Secondary | ICD-10-CM | POA: Insufficient documentation

## 2021-09-07 DIAGNOSIS — F17210 Nicotine dependence, cigarettes, uncomplicated: Secondary | ICD-10-CM | POA: Insufficient documentation

## 2021-09-07 DIAGNOSIS — I82462 Acute embolism and thrombosis of left calf muscular vein: Secondary | ICD-10-CM | POA: Insufficient documentation

## 2021-09-07 DIAGNOSIS — D75839 Thrombocytosis, unspecified: Secondary | ICD-10-CM | POA: Insufficient documentation

## 2021-09-07 DIAGNOSIS — Z800 Family history of malignant neoplasm of digestive organs: Secondary | ICD-10-CM | POA: Insufficient documentation

## 2021-09-07 DIAGNOSIS — D509 Iron deficiency anemia, unspecified: Secondary | ICD-10-CM | POA: Insufficient documentation

## 2021-09-07 MED ORDER — SODIUM CHLORIDE 0.9 % IV SOLN: Freq: Once | INTRAVENOUS | Status: AC

## 2021-09-07 MED ORDER — LORATADINE 10 MG PO TABS
10.0000 mg | ORAL_TABLET | Freq: Once | ORAL | Status: AC
  Administered 2021-09-07: 10 mg via ORAL
  Filled 2021-09-07: qty 1

## 2021-09-07 MED ORDER — ACETAMINOPHEN 325 MG PO TABS
650.0000 mg | ORAL_TABLET | Freq: Once | ORAL | Status: AC
  Administered 2021-09-07: 650 mg via ORAL
  Filled 2021-09-07: qty 2

## 2021-09-07 MED ORDER — SODIUM CHLORIDE 0.9 % IV SOLN
300.0000 mg | Freq: Once | INTRAVENOUS | Status: AC
  Administered 2021-09-07: 300 mg via INTRAVENOUS
  Filled 2021-09-07: qty 300

## 2021-09-07 NOTE — Patient Instructions (Signed)

## 2021-09-07 NOTE — Progress Notes (Signed)
Pt observed for 30 minutes post Venofer infusion. Pt tolerated trtmt well w/out incident. VSS at discharge.  Ambulatory to lobby in stable condition.

## 2021-09-09 ENCOUNTER — Ambulatory Visit: Payer: Self-pay | Admitting: Physician Assistant

## 2021-09-14 ENCOUNTER — Other Ambulatory Visit: Payer: Self-pay

## 2021-09-14 ENCOUNTER — Inpatient Hospital Stay: Payer: Self-pay

## 2021-09-14 VITALS — BP 102/70 | HR 94 | Temp 97.8°F | Resp 16

## 2021-09-14 DIAGNOSIS — D509 Iron deficiency anemia, unspecified: Secondary | ICD-10-CM

## 2021-09-14 MED ORDER — ACETAMINOPHEN 325 MG PO TABS
650.0000 mg | ORAL_TABLET | Freq: Once | ORAL | Status: AC
  Administered 2021-09-14: 650 mg via ORAL
  Filled 2021-09-14: qty 2

## 2021-09-14 MED ORDER — LORATADINE 10 MG PO TABS
10.0000 mg | ORAL_TABLET | Freq: Once | ORAL | Status: AC
  Administered 2021-09-14: 10 mg via ORAL
  Filled 2021-09-14: qty 1

## 2021-09-14 MED ORDER — SODIUM CHLORIDE 0.9 % IV SOLN
300.0000 mg | Freq: Once | INTRAVENOUS | Status: AC
  Administered 2021-09-14: 300 mg via INTRAVENOUS
  Filled 2021-09-14: qty 300

## 2021-09-14 MED ORDER — SODIUM CHLORIDE 0.9 % IV SOLN: Freq: Once | INTRAVENOUS | Status: AC

## 2021-09-14 NOTE — Patient Instructions (Signed)

## 2021-09-14 NOTE — Progress Notes (Signed)
Pt declined to be observed for 30 minutes post Venofer infusion. Pt tolerated trtmt well w/out incident. VSS at discharge.  Ambulatory to lobby.   

## 2021-09-23 ENCOUNTER — Inpatient Hospital Stay (HOSPITAL_BASED_OUTPATIENT_CLINIC_OR_DEPARTMENT_OTHER): Payer: Self-pay | Admitting: Hematology

## 2021-09-23 DIAGNOSIS — D509 Iron deficiency anemia, unspecified: Secondary | ICD-10-CM

## 2021-09-23 DIAGNOSIS — I82422 Acute embolism and thrombosis of left iliac vein: Secondary | ICD-10-CM

## 2021-09-23 NOTE — Progress Notes (Deleted)
Office Note    HPI: Mark Mcdaniel is a 31 y.o. (Jul 24, 1990) male presenting in follow-up status post left lower extremity percutaneous venous thrombectomy for iliofemoral DVT.  ***  The pt is *** on a statin for cholesterol management.  The pt is *** on a daily aspirin.   Other AC:  *** The pt is *** on medication for hypertension.   The pt is *** diabetic.  Tobacco hx:  ***  Past Medical History:  Diagnosis Date   ADHD (attention deficit hyperactivity disorder)    Alcohol abuse    Alcohol withdrawal syndrome without complication (HCC)    Alcoholic ketoacidosis 3/0/8657   Anxiety    Asthma    Depression    suicidal in past   History of adenomatous polyp of colon 01/04/2019   Right clavicle fracture 11/2018    Past Surgical History:  Procedure Laterality Date   HERNIA REPAIR     Performed when 6 months old   INTRAVASCULAR ULTRASOUND/IVUS N/A 08/05/2021   Procedure: Intravascular Ultrasound/IVUS;  Surgeon: Broadus John, MD;  Location: [place] [facility];  Service: Cardiovascular;  Laterality: N/A;   IVC VENOGRAPHY Left 08/05/2021   Procedure: IVC Venography;  Surgeon: Broadus John, MD;  Location: [place] [facility];  Service: Cardiovascular;  Laterality: Left;   LOWER EXTREMITY VENOGRAPHY Left 08/05/2021   Procedure: LOWER EXTREMITY VENOGRAPHY;  Surgeon: Broadus John, MD;  Location: [place] [facility];  Service: Cardiovascular;  Laterality: Left;   PERIPHERAL VASCULAR THROMBECTOMY Left 08/05/2021   Procedure: PERIPHERAL VASCULAR THROMBECTOMY;  Surgeon: Broadus John, MD;  Location: [place] [facility];  Service: Cardiovascular;  Laterality: Left;    Social History   Socioeconomic History   Marital status: Single    Spouse name: Not on file   Number of children: 1   Years of education: Not on file   Highest education level: Not on file  Occupational History   Not on file  Tobacco Use   Smoking status: Every Day    Packs/day: 0.50    Years: 15.00    Total  pack years: 7.50    Types: Cigarettes   Smokeless tobacco: Never  Vaping Use   Vaping Use: Never used  Substance and Sexual Activity   Alcohol use: Yes    Alcohol/week: 4.0 standard drinks of alcohol    Types: 4 Cans of beer per week   Drug use: Not Currently    Frequency: 1.0 times per week    Types: Marijuana    Comment: per pt 09/03/21   Sexual activity: Not Currently  Other Topics Concern   Not on file  Social History Narrative   Not on file   Social Determinants of Health   Financial Resource Strain: Not on file  Food Insecurity: Not on file  Transportation Needs: Not on file  Physical Activity: Not on file  Stress: Not on file  Social Connections: Not on file  Intimate Partner Violence: Not on file   *** Family History  Problem Relation Age of Onset   Diabetes Maternal Grandmother    Kidney disease Maternal Grandmother    Breast cancer Paternal Grandmother    Heart disease Other    Colon cancer Cousin    Esophageal cancer Neg Hx    Rectal cancer Neg Hx    Stomach cancer Neg Hx     Current Outpatient Medications  Medication Sig Dispense Refill   apixaban (ELIQUIS) 5 MG TABS tablet Take 2 tablets (10  mg) by mouth twice daily for 7 days, then switch to 1 tablet (5 mg) by mouth twice daily 120 tablet 3   ferrous sulfate 325 (65 FE) MG tablet Take 1 tablet (325 mg total) by mouth 2 (two) times daily. 60 tablet 3   pantoprazole (PROTONIX) 40 MG tablet Take 1 tablet (40 mg total) by mouth daily. 30 tablet 3   thiamine 100 MG tablet Take 1 tablet (100 mg total) by mouth daily. 30 tablet 3   vitamin B-12 (CYANOCOBALAMIN) 1000 MCG tablet Take 1 tablet (1,000 mcg total) by mouth daily. 30 tablet 3   No current facility-administered medications for this visit.    Allergies  Allergen Reactions   Tape Other (See Comments)    Medical tapes irritates the patient's skin   Tea Nausea And Vomiting     REVIEW OF SYSTEMS:  *** '[X]'$  denotes positive finding, '[ ]'$  denotes  negative finding Cardiac  Comments:  Chest pain or chest pressure:    Shortness of breath upon exertion:    Short of breath when lying flat:    Irregular heart rhythm:        Vascular    Pain in calf, thigh, or hip brought on by ambulation:    Pain in feet at night that wakes you up from your sleep:     Blood clot in your veins:    Leg swelling:         Pulmonary    Oxygen at home:    Productive cough:     Wheezing:         Neurologic    Sudden weakness in arms or legs:     Sudden numbness in arms or legs:     Sudden onset of difficulty speaking or slurred speech:    Temporary loss of vision in one eye:     Problems with dizziness:         Gastrointestinal    Blood in stool:     Vomited blood:         Genitourinary    Burning when urinating:     Blood in urine:        Psychiatric    Major depression:         Hematologic    Bleeding problems:    Problems with blood clotting too easily:        Skin    Rashes or ulcers:        Constitutional    Fever or chills:      PHYSICAL EXAMINATION:  There were no vitals filed for this visit.  General:  WDWN in NAD; vital signs documented above Gait: Not observed HENT: WNL, normocephalic Pulmonary: normal non-labored breathing , without wheezing Cardiac: {Desc; regular/irreg:14544} HR, bruit*** Abdomen: soft, NT, no masses Skin: {With/Without:20273} rashes Vascular Exam/Pulses:  Right Left  Radial {Exam; arterial pulse strength 0-4:30167} {Exam; arterial pulse strength 0-4:30167}  Ulnar {Exam; arterial pulse strength 0-4:30167} {Exam; arterial pulse strength 0-4:30167}  Femoral {Exam; arterial pulse strength 0-4:30167} {Exam; arterial pulse strength 0-4:30167}  Popliteal {Exam; arterial pulse strength 0-4:30167} {Exam; arterial pulse strength 0-4:30167}  DP {Exam; arterial pulse strength 0-4:30167} {Exam; arterial pulse strength 0-4:30167}  PT {Exam; arterial pulse strength 0-4:30167} {Exam; arterial pulse strength  0-4:30167}   Extremities: {With/Without:20273} ischemic changes, {With/Without:20273} Gangrene , {With/Without:20273} cellulitis; {With/Without:20273} open wounds;  Musculoskeletal: no muscle wasting or atrophy  Neurologic: A&O X 3;  No focal weakness or paresthesias are detected Psychiatric:  The pt has {Desc;  normal/abnormal:11317::"Normal"} affect.   Non-Invasive Vascular Imaging:   ***    ASSESSMENT/PLAN: Mark Mcdaniel is a 31 y.o. male presenting with ***   ***   Broadus John, MD [facility] [phone]

## 2021-09-25 ENCOUNTER — Telehealth: Payer: Self-pay | Admitting: Hematology

## 2021-09-25 ENCOUNTER — Encounter (HOSPITAL_COMMUNITY): Payer: Self-pay

## 2021-09-25 ENCOUNTER — Encounter: Payer: Self-pay | Admitting: Vascular Surgery

## 2021-09-25 ENCOUNTER — Inpatient Hospital Stay (HOSPITAL_COMMUNITY): Admission: RE | Admit: 2021-09-25 | Payer: Self-pay | Source: Ambulatory Visit

## 2021-09-25 NOTE — Telephone Encounter (Signed)
Scheduled follow-up appointment per 7/26 los. Patient is aware. 

## 2021-09-30 ENCOUNTER — Encounter: Payer: Self-pay | Admitting: Hematology

## 2021-09-30 NOTE — Progress Notes (Addendum)
[facility]   HEMATOLOGY/ONCOLOGY PHONE VISIT NOTE  Date of Service: 09/23/2021   Patient Care Team: [name], [name], NP as PCP - General (Family Medicine)  CHIEF COMPLAINTS/PURPOSE OF CONSULTATION:  Follow-up for extensive DVT, bilateral PE and iron deficiency anemia  HISTORY OF PRESENTING ILLNESS:   Mark Mcdaniel is a wonderful 31 y.o. male who has been referred to [place] by Dr .Kelby Fam, [name], NP for evaluation and management of recent extensive DVT and PE.  Patient has a history of ADHD, asthma, depression, active tobacco abuse smoking 1/2 packs of cigarettes per day, history of cocaine and THC abuse and history of significant alcohol abuse. He notes he had lost his job and was less active and presented to the hospital in early June 2023 with progressive left leg pain which on evaluation showed extensive left lower extremity DVT and bilateral PE.  He had a thrombectomy of his left lower extremity on 08/05/2021.  He was initially on IV heparin and then was transitioned to Eliquis on discharge.  His initial ultrasound on 08/03/2021 showed acute DVT involving the left common femoral vein saphenofemoral junction left femoral vein left proximal profunda vein, left popliteal vein left posterior tibial vein and left peroneal vein and left gastrocnemius vein.  Patient notes that he did not have any shortness of breath or chest pain. He notes his left lower extremity swelling is much improved.  He is tolerating his anticoagulation without any bleeding issues.  His extensive DVT and PE was thought to be due to polysubstance abuse including heavy smoking and dehydration from alcohol abuse but could not rule out May Thurner syndrome. He also notes that he has a  mat cousin with possible genetic disorder causing blood clots @ 46 yrs of age.   Currently still smoking half a pack of cigarettes per day.  Has cut down alcohol use but has not completely quit yet. He notes he has been sober from [facility] and cocaine  for about 3 to 4 years.  No other acute new focal symptoms suggestive of malignancy.  INTERVAL HISTORY  ..I connected with Glennon Mac on 09/23/2021 at  8:40 AM EDT by telephone visit and verified that I am speaking with the correct person using two identifiers.   I discussed the limitations, risks, security and privacy concerns of performing an evaluation and management service by telemedicine and the availability of in-person appointments. I also discussed with the patient that there may be a patient responsible charge related to this service. The patient expressed understanding and agreed to proceed.   Other persons participating in the visit and their role in the encounter: None  Patient's location: Home Provider's location: [place]  Chief Complaint: Discussion of hypercoagulability work-up and follow-up for extensive DVT and PE.  Patient notes she has been doing well and notes no new or currently present lower extremity leg swelling or pain.  No new chest pain or shortness of breath. We discussed that his hypercoagulability work-up was unrevealing for inherited clotting disorder. He has been seen by gastroenterology for his iron deficiency anemia and they plan to do EGD and colonoscopy after he can be taken off anticoagulation after 3 to 6 months of uninterrupted anticoagulation. He has an upcoming follow-up with vascular surgery. He notes he is cutting down his smoking but is still smoking about 5 cigarettes a day. He notes that he has quit his alcohol use at this time.    MEDICAL HISTORY:  Past Medical History:  Diagnosis Date   ADHD (attention deficit hyperactivity disorder)    Alcohol abuse    Alcohol withdrawal syndrome without complication ([place])    Alcoholic ketoacidosis 10/06/8674   Anxiety    Asthma    Depression    suicidal in past   History of adenomatous polyp of colon 01/04/2019   Right clavicle fracture 11/2018    SURGICAL HISTORY: Past  Surgical History:  Procedure Laterality Date   HERNIA REPAIR     Performed when 6 months old   INTRAVASCULAR ULTRASOUND/IVUS N/A 08/05/2021   Procedure: Intravascular Ultrasound/IVUS;  Surgeon: Broadus John, MD;  Location: [place] [facility];  Service: Cardiovascular;  Laterality: N/A;   IVC VENOGRAPHY Left 08/05/2021   Procedure: IVC Venography;  Surgeon: Broadus John, MD;  Location: [facility];  Service: Cardiovascular;  Laterality: Left;   LOWER EXTREMITY VENOGRAPHY Left 08/05/2021   Procedure: LOWER EXTREMITY VENOGRAPHY;  Surgeon: Broadus John, MD;  Location: [facility];  Service: Cardiovascular;  Laterality: Left;   PERIPHERAL VASCULAR THROMBECTOMY Left 08/05/2021   Procedure: PERIPHERAL VASCULAR THROMBECTOMY;  Surgeon: Broadus John, MD;  Location: [facility];  Service: Cardiovascular;  Laterality: Left;    SOCIAL HISTORY: Social History   Socioeconomic History   Marital status: Single    Spouse name: Not on file   Number of children: 1   Years of education: Not on file   Highest education level: Not on file  Occupational History   Not on file  Tobacco Use   Smoking status: Every Day    Packs/day: 0.50    Years: 15.00    Total pack years: 7.50    Types: Cigarettes   Smokeless tobacco: Never  Vaping Use   Vaping Use: Never used  Substance and Sexual Activity   Alcohol use: Yes    Alcohol/week: 4.0 standard drinks of alcohol    Types: 4 Cans of beer per week   Drug use: Not Currently    Frequency: 1.0 times per week    Types: Marijuana    Comment: per pt 09/03/21   Sexual activity: Not Currently  Other Topics Concern   Not on file  Social History Narrative   Not on file   Social Determinants of Health   Financial Resource Strain: Not on file  Food Insecurity: Not on file  Transportation Needs: Not on file  Physical Activity: Not on file  Stress: Not on file  Social Connections: Not on file  Intimate Partner Violence: Not on  file    FAMILY HISTORY: Family History  Problem Relation Age of Onset   Diabetes Maternal Grandmother    Kidney disease Maternal Grandmother    Breast cancer Paternal Grandmother    Heart disease Other    Colon cancer Cousin    Esophageal cancer Neg Hx    Rectal cancer Neg Hx    Stomach cancer Neg Hx     ALLERGIES:  is allergic to tape and tea.  MEDICATIONS:  Current Outpatient Medications  Medication Sig Dispense Refill   apixaban (ELIQUIS) 5 MG TABS tablet Take 2 tablets (10 mg) by mouth twice daily for 7 days, then switch to 1 tablet (5 mg) by mouth twice daily 120 tablet 3   ferrous sulfate 325 (65 FE) MG tablet Take 1 tablet (325 mg total) by mouth 2 (two) times daily. 60 tablet 3   pantoprazole (PROTONIX) 40 MG tablet Take 1 tablet (40 mg total) by mouth daily. 30 tablet  3   thiamine 100 MG tablet Take 1 tablet (100 mg total) by mouth daily. 30 tablet 3   vitamin B-12 (CYANOCOBALAMIN) 1000 MCG tablet Take 1 tablet (1,000 mcg total) by mouth daily. 30 tablet 3   No current facility-administered medications for this visit.    REVIEW OF SYSTEMS:    10 Point review of Systems was done is negative except as noted above.  PHYSICAL EXAMINATION: Telemedicine visit  LABORATORY DATA:  I have reviewed the data as listed  .    Latest Ref Rng & Units 08/26/2021   12:34 PM 08/14/2021   12:47 PM 08/07/2021    3:22 AM  CBC  WBC 4.0 - 10.5 K/uL 9.0  10.8  9.8   Hemoglobin 13.0 - 17.0 g/dL 12.7  10.5  8.2   Hematocrit 39.0 - 52.0 % 42.7  34.7  27.8   Platelets 150 - 400 K/uL 460  766.0  454     .    Latest Ref Rng & Units 08/26/2021   12:34 PM 08/07/2021    3:22 AM 08/04/2021    4:30 AM  CMP  Glucose 70 - 99 mg/dL 81  103  105   BUN 6 - 20 mg/dL 12  <5  7   Creatinine 0.61 - 1.24 mg/dL 0.73  0.54  0.67   Sodium 135 - 145 mmol/L 138  135  134   Potassium 3.5 - 5.1 mmol/L 4.0  3.7  3.7   Chloride 98 - 111 mmol/L 101  101  105   CO2 22 - 32 mmol/L '28  24  22   '$ Calcium 8.9 -  10.3 mg/dL 10.0  8.6  8.3   Total Protein 6.5 - 8.1 g/dL 8.0   6.3   Total Bilirubin 0.3 - 1.2 mg/dL 0.7   0.6   Alkaline Phos 38 - 126 U/L 63   50   AST 15 - 41 U/L 14   11   ALT 0 - 44 U/L 10   7      RADIOGRAPHIC STUDIES: I have personally reviewed the radiological images as listed and agreed with the findings in the report. No results found.  ASSESSMENT & PLAN:   31 year old Caucasian male with possible family history of VTE in a maternal cousin with  #1 Extensive left lower extremity DVT extending into the left common iliac vein all the way down to the calf veins. #2 bilateral lobar segmental and subsegmental pulmonary emboli with moderate to large thrombus burden's Plan Thrombophilia work-up unrevealing discussed in detail with the patient. We discussed smoking being a significant risk factor for his venous thrombosis and thus setting of dehydration from alcohol abuse.  He has cut down her smoking but is not off the smoking yet. She has follow-up ultrasound and vascular surgery follow-up scheduled which she was encouraged to follow-up on to rule out May Thurner syndrome -We discussed that we shall continue his Eliquis at 5 mg p.o. twice daily at this time.  He will need to be on anticoagulation until his May Thurner syndrome is evaluated and addressed and that he is able to completely quit his smoking and after being treated for at least 6 to 9 months.  After this he would need to transition to baby aspirin.  #3 iron deficiency anemia --likely due to poor p.o. intake with patient's alcohol abuse.  Cannot rule out other sources of GI losses. #4 thrombocytosis likely reactive due to iron deficiency but in the context of extensive  thrombosis will rule out clonal thrombocytosis. Plan Patient tolerated IV iron infusion well and notes improved energy levels. Still having some intermittent hemorrhoidal bleeding Has been evaluated by gastroenterology and has follow-up with Dr. Carlean Purl to  plan EGD and colonoscopy.   Orders Placed This Encounter  Procedures   CBC with Differential ([facility] Only)    Standing Status:   Future    Standing Expiration Date:   09/24/2022   CMP ([facility] only)    Standing Status:   Future    Standing Expiration Date:   09/24/2022   Ferritin    Standing Status:   Future    Standing Expiration Date:   09/24/2022   Iron and TIBC    Standing Status:   Future    Standing Expiration Date:   09/24/2022   Vitamin B12    Standing Status:   Future    Standing Expiration Date:   09/24/2022   Follow-up Return to clinic with Dr. Irene Limbo with labs in 3 months  The total time spent in the appointment was 23 minutes*.  All of the patient's questions were answered with apparent satisfaction. The patient knows to call the clinic with any problems, questions or concerns.   Sullivan Lone MD MS AAHIVMS [facility] Hematology/Oncology Physician [facility]  .*Total Encounter Time as defined by the Centers for Medicare and Medicaid Services includes, in addition to the face-to-face time of a patient visit (documented in the note above) non-face-to-face time: obtaining and reviewing outside history, ordering and reviewing medications, tests or procedures, care coordination (communications with other health care professionals or caregivers) and documentation in the medical record.

## 2021-10-26 ENCOUNTER — Encounter: Payer: Self-pay | Admitting: Family

## 2021-10-26 NOTE — Progress Notes (Signed)
  This encounter was created in error - please disregard. No show 

## 2021-11-04 ENCOUNTER — Ambulatory Visit: Payer: Self-pay | Admitting: Internal Medicine

## 2021-11-07 ENCOUNTER — Encounter (HOSPITAL_COMMUNITY): Payer: Self-pay

## 2021-11-07 ENCOUNTER — Other Ambulatory Visit: Payer: Self-pay

## 2021-11-07 ENCOUNTER — Emergency Department (HOSPITAL_COMMUNITY)
Admission: EM | Admit: 2021-11-07 | Discharge: 2021-11-07 | Disposition: A | Discharge status: Home / Self Care | Payer: Self-pay | Attending: Emergency Medicine | Admitting: Emergency Medicine

## 2021-11-07 DIAGNOSIS — K644 Residual hemorrhoidal skin tags: Secondary | ICD-10-CM | POA: Insufficient documentation

## 2021-11-07 DIAGNOSIS — Z7901 Long term (current) use of anticoagulants: Secondary | ICD-10-CM | POA: Insufficient documentation

## 2021-11-07 DIAGNOSIS — R58 Hemorrhage, not elsewhere classified: Secondary | ICD-10-CM

## 2021-11-07 MED ORDER — HYDROCORTISONE ACETATE 25 MG RE SUPP: 25.0000 mg | Freq: Two times a day (BID) | RECTAL | 0 refills | Status: DC

## 2021-11-07 MED ORDER — OXYCODONE-ACETAMINOPHEN 5-325 MG PO TABS
1.0000 | ORAL_TABLET | Freq: Once | ORAL | Status: AC
  Administered 2021-11-07: 1 via ORAL
  Filled 2021-11-07: qty 1

## 2021-11-07 MED ORDER — NITROGLYCERIN 0.4 % RE OINT: 1.0000 | TOPICAL_OINTMENT | Freq: Every day | RECTAL | 0 refills | Status: DC | PRN

## 2021-11-07 MED ORDER — ONDANSETRON 4 MG PO TBDP
8.0000 mg | ORAL_TABLET | Freq: Once | ORAL | Status: AC
Start: 2021-11-07 — End: 2021-11-07
  Administered 2021-11-07: 8 mg via ORAL
  Filled 2021-11-07: qty 2

## 2021-11-07 NOTE — ED Triage Notes (Signed)
Hx of hemorrhoids reports irritated x 30 hours.  No bleeding.  Reports didn't want to come but gma made him.

## 2021-11-07 NOTE — ED Provider Notes (Signed)
[facility] EMERGENCY DEPARTMENT Provider Note   CSN: [id number] Arrival date & time: 11/07/21  1614     History  Chief Complaint  Patient presents with   Hemorrhoids    Mark Mcdaniel is a 31 y.o. male.  HPI   Patient with history of alcohol use disorder, previous DVT and PE on Eliquis, chronic hemorrhoids presents today due to hemorrhoids.  Patient states she is been having hemorrhoid flares for some time, he is followed by [facility] gastroenterology.  States last few days he has been having painful internal hemorrhoids.  He is able to defecate although he has been straining and it worsens the hemorrhoids.  He has tried prep H, sitz bath's with minimal relief.  No vomiting, abdominal pain, fevers.  Reports trace amounts of blood on paper but no significant blood loss.  Home Medications Prior to Admission medications   Medication Sig Start Date End Date Taking? Authorizing Provider  apixaban (ELIQUIS) 5 MG TABS tablet Take 2 tablets (10 mg) by mouth twice daily for 7 days, then switch to 1 tablet (5 mg) by mouth twice daily 08/07/21   Ghimire, Henreitta Leber, MD  ferrous sulfate 325 (65 FE) MG tablet Take 1 tablet (325 mg total) by mouth 2 (two) times daily. 08/07/21 12/05/21  Ghimire, Henreitta Leber, MD  hydrocortisone (ANUSOL-HC) 25 MG suppository Place 1 suppository (25 mg total) rectally 2 (two) times daily. 11/07/21   Sherrill Raring, PA-C  Nitroglycerin 0.4 % OINT Place 1 Dose rectally daily as needed. 11/07/21   Sherrill Raring, PA-C  pantoprazole (PROTONIX) 40 MG tablet Take 1 tablet (40 mg total) by mouth daily. 08/07/21   Ghimire, Henreitta Leber, MD  thiamine 100 MG tablet Take 1 tablet (100 mg total) by mouth daily. 08/07/21   Ghimire, Henreitta Leber, MD  vitamin B-12 (CYANOCOBALAMIN) 1000 MCG tablet Take 1 tablet (1,000 mcg total) by mouth daily. 08/07/21   Ghimire, Henreitta Leber, MD      Allergies    Tape and Tea    Review of Systems   Review of Systems  Physical Exam Updated Vital Signs BP  (!) 130/94   Pulse 62   Temp 98.3 F (36.8 C) (Oral)   Resp 20   Ht '5\' 11"'$  (1.803 m)   Wt 58.1 kg   SpO2 97%   BMI 17.85 kg/m  Physical Exam Vitals and nursing note reviewed. Exam conducted with a chaperone present.  Constitutional:      Appearance: Normal appearance.  HENT:     Head: Normocephalic and atraumatic.  Eyes:     General: No scleral icterus.       Right eye: No discharge.        Left eye: No discharge.     Extraocular Movements: Extraocular movements intact.     Pupils: Pupils are equal, round, and reactive to light.  Cardiovascular:     Rate and Rhythm: Normal rate and regular rhythm.     Pulses: Normal pulses.     Heart sounds: Normal heart sounds. No murmur heard.    No friction rub. No gallop.  Pulmonary:     Effort: Pulmonary effort is normal. No respiratory distress.     Breath sounds: Normal breath sounds.  Abdominal:     General: Abdomen is flat. Bowel sounds are normal. There is no distension.     Palpations: Abdomen is soft.     Tenderness: There is no abdominal tenderness.  Genitourinary:    Comments: Exam performed with  chaperone in room.  Patient has external hemorrhoids which were not prolapse or bleeding.  No prolapsed rectum.  Patient declined internal rectal exam  Skin:    General: Skin is warm and dry.     Coloration: Skin is not jaundiced.  Neurological:     Mental Status: He is alert. Mental status is at baseline.     Coordination: Coordination normal.     ED Results / Procedures / Treatments   Labs (all labs ordered are listed, but only abnormal results are displayed) Labs Reviewed - No data to display  EKG None  Radiology No results found.  Procedures Procedures    Medications Ordered in ED Medications  oxyCODONE-acetaminophen (PERCOCET/ROXICET) 5-325 MG per tablet 1 tablet (1 tablet Oral Given 11/07/21 1949)  ondansetron (ZOFRAN-ODT) disintegrating tablet 8 mg (8 mg Oral Given 11/07/21 1949)    ED Course/ Medical  Decision Making/ A&P                           Medical Decision Making Risk Prescription drug management.   Patient presents due to hemorrhoids.  He has external hemorrhoids on exam and history consistent with internal hemorrhoids.  He is not having any difficulty defecating, no signs of abscess or fevers.  He does not look systemically ill, SIRS negative.  Will prescribe Anusol as well as nitro glycerin ointment.  Patient will follow-up with his gastroenterologist next week.  Return precautions were discussed and agreed upon.        Final Clinical Impression(s) / ED Diagnoses Final diagnoses:  Internal hemorrhage    Rx / DC Orders ED Discharge Orders          Ordered    Nitroglycerin 0.4 % OINT  Daily PRN,   Status:  Discontinued        11/07/21 1927    hydrocortisone (ANUSOL-HC) 25 MG suppository  2 times daily,   Status:  Discontinued        11/07/21 1927    hydrocortisone (ANUSOL-HC) 25 MG suppository  2 times daily        11/07/21 1928    Nitroglycerin 0.4 % OINT  Daily PRN        11/07/21 1928              Sherrill Raring, Hershal Coria 11/07/21 2000    Tegeler, Gwenyth Allegra, MD 11/07/21 2031

## 2021-11-07 NOTE — Discharge Instructions (Addendum)
Continue taking stool softener such as MiraLAX.  Use the nitroglycerin ointment twice daily for 12 weeks until you are feeling better.  This is a medicine that will help dilate the vessels and help alleviate pain.  Use the annual suppository twice daily as well.  The pain medicine given to the ED unfortunately causes constipation but should give you some temporary relief of pain.  Use MiraLAX at home.  Follow-up with your GI on Monday.  You also may need surgical referral for removal of the hemorrhoids if the banding is not helping, information for general surgery provided above.

## 2021-11-11 ENCOUNTER — Telehealth: Payer: Self-pay | Admitting: Nurse Practitioner

## 2021-11-11 NOTE — Telephone Encounter (Signed)
Inbound call from patient stating that he wanted to schedule a hem banding appointment. Looking at his last OV note with Jaclyn Shaggy I do not see anything where Jaclyn Shaggy mentions scheduling an appointment for hem banding. Patient is requesting a call back to discuss if he needs a OV first or can directly schedule hem band appointment. Please advise.

## 2021-11-11 NOTE — Telephone Encounter (Signed)
Pt stated that he feels that he needs some hemorrhoids banded as they have been given him lots of discomfort recently: Pt stated that he does have a history of hemorrhoids:  Pt scheduled to see Dr. Carlean Purl for assessment for  Hemorrhoid Banding on 11/17/2021 at 3:10: Pt made aware Pt verbalized understanding with all questions answered.

## 2021-11-12 ENCOUNTER — Telehealth: Payer: Self-pay

## 2021-11-12 ENCOUNTER — Ambulatory Visit: Payer: Self-pay | Admitting: Family Medicine

## 2021-11-12 NOTE — Telephone Encounter (Signed)
Pt grandmother Pelzer Sjogren stated that pt is in severe pain in the floor upstair hollering due to the hemorrhoid pain: Stanton Kidney stated pt cannot void either and last time pt voided was yesterday afternoon: Chart reviewed: Pt was in the ED on 11/07/2021 and was treated for hemorrhoids:  Pt has an appointment with Dr. Carlean Purl on 11/17/2021 at 3:10: Stanton Kidney reminded: Stanton Kidney was notified that if pt cannot void and is in severe pain then he should go to the ED for evaluation and treatment: Stanton Kidney verbalized understanding with all questions answered.

## 2021-11-12 NOTE — Telephone Encounter (Signed)
Appointment changed. Just FYI I created a phone note on this pt this AM

## 2021-11-12 NOTE — Telephone Encounter (Signed)
-----   Message from Gatha Mayer, MD sent at 11/12/2021 11:02 AM EDT ----- Regarding: appoointment status Please change appt  status to follow-up on him - not sure I will band him  Don't need to call him

## 2021-11-17 ENCOUNTER — Ambulatory Visit (INDEPENDENT_AMBULATORY_CARE_PROVIDER_SITE_OTHER): Payer: Self-pay | Admitting: Internal Medicine

## 2021-11-17 ENCOUNTER — Encounter: Payer: Self-pay | Admitting: Internal Medicine

## 2021-11-17 VITALS — BP 110/76 | HR 94 | Ht 72.0 in | Wt 124.0 lb

## 2021-11-17 DIAGNOSIS — K642 Third degree hemorrhoids: Secondary | ICD-10-CM

## 2021-11-17 DIAGNOSIS — K601 Chronic anal fissure: Secondary | ICD-10-CM

## 2021-11-17 DIAGNOSIS — I2699 Other pulmonary embolism without acute cor pulmonale: Secondary | ICD-10-CM

## 2021-11-17 DIAGNOSIS — I82402 Acute embolism and thrombosis of unspecified deep veins of left lower extremity: Secondary | ICD-10-CM

## 2021-11-17 DIAGNOSIS — Z5989 Other problems related to housing and economic circumstances: Secondary | ICD-10-CM

## 2021-11-17 DIAGNOSIS — F1014 Alcohol abuse with alcohol-induced mood disorder: Secondary | ICD-10-CM

## 2021-11-17 DIAGNOSIS — D509 Iron deficiency anemia, unspecified: Secondary | ICD-10-CM

## 2021-11-17 MED ORDER — AMBULATORY NON FORMULARY MEDICATION: 3 refills | Status: AC

## 2021-11-17 NOTE — Progress Notes (Addendum)
Mark Mcdaniel 31 y.o. Jul 15, 1990 [id number]  Assessment & Plan:   Encounter Diagnoses  Name Primary?   Chronic posterior anal fissure - suspected Yes   Alcohol abuse with alcohol-induced mood disorder (HCC)    Bilateral pulmonary embolism (HCC)    Acute deep vein thrombosis (DVT) of left lower extremity, unspecified vein (HCC)    Prolapsed internal hemorrhoids, grade 3    Iron deficiency anemia    Does not have health insurance    Complicated situation.  The primary issue right now I think is most likely an anal fissure.  I do not think prolapsing hemorrhoids which it sounds like he has on a recurrent basis, are causing this severe pain and tenderness on rectal exam.  A thrombosed hemorrhoid would but there is no sign of that.  His lack of insurance and inability to purchase his medications is a problem as well for him.  I will reach out to the vascular surgeons and his primary care to see if they can help him with discounted or free Eliquis.  He really needs to get back on that and I explained why.  Additionally he was supposed to have vascular surgery follow-up.  Lack of insurance may be problematic there as well.   I am going to treat suspected fissure with diltiazem 2%/lidocaine 5% cream.  He knows to purchase this from [facility] and hopefully he can afford that.  He will come back here in 2 months.  I am not convinced he needs a colonoscopy and EGD to investigate this iron deficiency anemia that is most likely multifactorial but we will keep it in mind.  Anticoagulation treatment could impact the timing of that as well.  Stop alcohol.  CC: Mark, [name], NP Dr. Orlie Pollen  Subjective:   Chief Complaint: Rectal bleeding and plain  HPI 30 year old white man with a history of adenomatous colon polyp and grade 3 prolapsing hemorrhoids, alcoholism and iron deficiency anemia in the setting of recent DVT/PE requiring mechanical thrombectomy during hospitalization  in June 2023.  He saw  Best at this office on July 6.  Follow-up was arranged to consider timing of potential EGD and colonoscopy.  He saw hematology oncology (Dr. Irene Limbo) July 26 and it was recommended he take Eliquis for 6 to 9 months straight prior to interrupting, and that he had responded to IV iron therapy regarding his iron deficiency anemia.  It was recommended he follow-up with vascular surgery because of the possibility of May Thurner syndrome.  He did not have any acquired or inherited thrombophilias.  He says he was told he could stop ferrous sulfate.  He is essentially not taking any medicines right now.  He was in the emergency department on September 9 complaining of pain and would not allow a digital rectal exam due to pain.  He was prescribed Anusol HC suppositories which he purchased and used and then nitroglycerin 4% ointment which was $700 so he did not purchase it.  He feels somewhat better but still describes prolapsing and intermittent bleeding from hemorrhoids.  He also has pain with and after defecation that is sharp and severe and posterior.  He continues to drink alcohol but has reduced to 1 or 2 beers a day.  He is having some nausea and dry heaves and a poor appetite.  Colonoscopy 02/16/2019: Deformed coccyx found on perianal exam. - Mobile coccyx found on digital rectal exam. - One 1 to 2 mm hyperplastic polyp in the  proximal sigmoid colon, removed with a cold biopsy forceps. Resected and retrieved. - Internal hemorrhoids. - Mucosal ulceration. in rectum from recent hemorrhoid liigation left lateral - The examination was otherwise normal on direct and retroflexion views. -Recall colonoscopy 7 years   Flexible sigmoidoscopy 01/04/2019: Bony prominence proximal coccyx found on perianal exam. - Mobile distal coccyx found on digital rectal exam. - External and internal hemorrhoids. Anoscopy also performed - One diminutive tubular adenomatous polyp in the  sigmoid colon, removed with a cold snare. Resected and retrieved. - The examination was otherwise normal. into right colon though prep not adequate there  Allergies  Allergen Reactions   Tape Other (See Comments)    Medical tapes irritates the patient's skin   Tea Nausea And Vomiting   Current Meds  Medication Sig   AMBULATORY NON FORMULARY MEDICATION Medication Name: diltiazem 2%/Lidocaine 5% 1:1 mix cream  Apply small  amount inside rectum tid   Past Medical History:  Diagnosis Date   ADHD (attention deficit hyperactivity disorder)    Alcohol abuse    Alcohol withdrawal syndrome without complication (HCC)    Alcoholic ketoacidosis 57/32/2025   Anemia    Anxiety    Asthma    Depression    suicidal in past   DVT (deep venous thrombosis) (HCC)    History of adenomatous polyp of colon 01/04/2019   Pulmonary embolism ([place])    Right clavicle fracture 11/2018   Vitamin B12 deficiency    Past Surgical History:  Procedure Laterality Date   COLONOSCOPY  02/16/2019   FLEXIBLE SIGMOIDOSCOPY  01/04/2019   HERNIA REPAIR     Performed when 6 months old   INTRAVASCULAR ULTRASOUND/IVUS N/A 08/05/2021   Procedure: Intravascular Ultrasound/IVUS;  Surgeon: Broadus John, MD;  Location: [facility];  Service: Cardiovascular;  Laterality: N/A;   IVC VENOGRAPHY Left 08/05/2021   Procedure: IVC Venography;  Surgeon: Broadus John, MD;  Location: [facility];  Service: Cardiovascular;  Laterality: Left;   LOWER EXTREMITY VENOGRAPHY Left 08/05/2021   Procedure: LOWER EXTREMITY VENOGRAPHY;  Surgeon: Broadus John, MD;  Location: [facility];  Service: Cardiovascular;  Laterality: Left;   PERIPHERAL VASCULAR THROMBECTOMY Left 08/05/2021   Procedure: PERIPHERAL VASCULAR THROMBECTOMY;  Surgeon: Broadus John, MD;  Location: [facility];  Service: Cardiovascular;  Laterality: Left;   Social History   Social History Narrative   Single, 1 child unemployed    Alcoholic, cigarette smoker history of marijuana use   family history includes Breast cancer in his paternal grandmother; Colon cancer in his cousin; Diabetes in his maternal grandmother; Heart disease in an other family member; Kidney disease in his maternal grandmother.   Review of Systems  See HPI Objective:   Physical Exam BP 110/76   Pulse 94   Ht 6' (1.829 m)   Wt 124 lb (56.2 kg)   SpO2 96%   BMI 16.82 kg/m   Thin NAD  Deformed coccyx as before  Nl anoderm  DRE - very tender posterior w/ stenosis and spasm - 5th digit exam  Anoscopy not possible due to pain/declined

## 2021-11-17 NOTE — Patient Instructions (Addendum)
We have printed a prescription for diltiazem gel to take to [facility].   You should apply a pea size amount to your rectum three times daily x 6-8 weeks.  [facility]'s information is below: Address: [address], [place], [place] [address]  [phone]   Avoid alcohol.  Get back on your vitamins.   Please call your vascular surgeon  to schedule a follow visit.   The [facility] providers would like to encourage you to use [facility] to communicate with providers for non-urgent requests or questions.  Due to long hold times on the telephone, sending your provider a message by [facility] may be a faster and more efficient way to get a response.  Please allow 48 business hours for a response.  Please remember that this is for non-urgent requests.

## 2021-12-24 ENCOUNTER — Other Ambulatory Visit: Payer: Self-pay

## 2021-12-24 DIAGNOSIS — D509 Iron deficiency anemia, unspecified: Secondary | ICD-10-CM

## 2021-12-25 ENCOUNTER — Inpatient Hospital Stay: Payer: Self-pay | Attending: Hematology | Admitting: Hematology

## 2021-12-25 ENCOUNTER — Inpatient Hospital Stay: Payer: Self-pay

## 2022-01-27 ENCOUNTER — Ambulatory Visit: Payer: Self-pay | Admitting: Internal Medicine

## 2022-02-17 ENCOUNTER — Other Ambulatory Visit: Payer: Self-pay | Admitting: Vascular Surgery

## 2022-02-17 DIAGNOSIS — I82422 Acute embolism and thrombosis of left iliac vein: Secondary | ICD-10-CM

## 2024-02-19 ENCOUNTER — Encounter (HOSPITAL_COMMUNITY): Payer: Self-pay

## 2024-02-19 ENCOUNTER — Ambulatory Visit
Admission: EM | Admit: 2024-02-19 | Discharge: 2024-02-19 | Disposition: A | Discharge status: Home / Self Care | Attending: Emergency Medicine | Admitting: Emergency Medicine

## 2024-02-19 ENCOUNTER — Emergency Department (HOSPITAL_COMMUNITY)

## 2024-02-19 ENCOUNTER — Encounter: Payer: Self-pay | Admitting: Hematology

## 2024-02-19 ENCOUNTER — Emergency Department (HOSPITAL_COMMUNITY)
Admission: EM | Admit: 2024-02-19 | Discharge: 2024-02-19 | Disposition: A | Discharge status: Home / Self Care | Attending: Emergency Medicine | Admitting: Emergency Medicine

## 2024-02-19 ENCOUNTER — Other Ambulatory Visit: Payer: Self-pay

## 2024-02-19 DIAGNOSIS — Z7901 Long term (current) use of anticoagulants: Secondary | ICD-10-CM | POA: Diagnosis not present

## 2024-02-19 DIAGNOSIS — R10812 Left upper quadrant abdominal tenderness: Secondary | ICD-10-CM

## 2024-02-19 DIAGNOSIS — K6289 Other specified diseases of anus and rectum: Secondary | ICD-10-CM | POA: Insufficient documentation

## 2024-02-19 DIAGNOSIS — R911 Solitary pulmonary nodule: Secondary | ICD-10-CM | POA: Diagnosis not present

## 2024-02-19 DIAGNOSIS — S298XXA Other specified injuries of thorax, initial encounter: Secondary | ICD-10-CM

## 2024-02-19 DIAGNOSIS — R0781 Pleurodynia: Secondary | ICD-10-CM | POA: Diagnosis not present

## 2024-02-19 DIAGNOSIS — S20212A Contusion of left front wall of thorax, initial encounter: Secondary | ICD-10-CM | POA: Insufficient documentation

## 2024-02-19 DIAGNOSIS — S299XXA Unspecified injury of thorax, initial encounter: Secondary | ICD-10-CM | POA: Diagnosis present

## 2024-02-19 LAB — I-STAT CHEM 8, ED
BUN: 9 mg/dL (ref 6–20)
Calcium, Ion: 1.18 mmol/L (ref 1.15–1.40)
Chloride: 98 mmol/L (ref 98–111)
Creatinine, Ser: 0.8 mg/dL (ref 0.61–1.24)
Glucose, Bld: 111 mg/dL — ABNORMAL HIGH (ref 70–99)
HCT: 45 % (ref 39.0–52.0)
Hemoglobin: 15.3 g/dL (ref 13.0–17.0)
Potassium: 3.6 mmol/L (ref 3.5–5.1)
Sodium: 134 mmol/L — ABNORMAL LOW (ref 135–145)
TCO2: 23 mmol/L (ref 22–32)

## 2024-02-19 MED ORDER — IOHEXOL 350 MG/ML SOLN
75.0000 mL | Freq: Once | INTRAVENOUS | Status: AC | PRN
  Administered 2024-02-19: 75 mL via INTRAVENOUS

## 2024-02-19 MED ORDER — TETANUS-DIPHTH-ACELL PERTUSSIS 5-2-15.5 LF-MCG/0.5 IM SUSP: 0.5000 mL | Freq: Once | INTRAMUSCULAR | Status: DC

## 2024-02-19 NOTE — Discharge Instructions (Addendum)
 I am concerned about broken ribs, but I am concerned about a splenic laceration.  I am sending you to the ER to rule out damage to your spleen.  Let them know if your pain changes or gets worse.  You can go to [facility], but if they find an injury to your spleen, they may transfer you to [facility] or [facility].  I would advise going to [facility] as it is the closest ED.
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
## 2024-02-19 NOTE — ED Notes (Signed)
 Patient transported to CT
